# Patient Record
Sex: Female | Born: 1945 | Race: White | Hispanic: No | Marital: Married | Smoking: Never smoker
Health system: Southern US, Community
[De-identification: ages and names within clinical notes are randomized; demographics above are authoritative.]

## PROBLEM LIST (undated history)

## (undated) DIAGNOSIS — F32A Depression, unspecified: Secondary | ICD-10-CM

## (undated) DIAGNOSIS — I872 Venous insufficiency (chronic) (peripheral): Secondary | ICD-10-CM

## (undated) DIAGNOSIS — H269 Unspecified cataract: Secondary | ICD-10-CM

## (undated) DIAGNOSIS — I491 Atrial premature depolarization: Secondary | ICD-10-CM

## (undated) DIAGNOSIS — G709 Myoneural disorder, unspecified: Secondary | ICD-10-CM

## (undated) DIAGNOSIS — C4490 Unspecified malignant neoplasm of skin, unspecified: Secondary | ICD-10-CM

## (undated) DIAGNOSIS — K5730 Diverticulosis of large intestine without perforation or abscess without bleeding: Secondary | ICD-10-CM

## (undated) DIAGNOSIS — G4730 Sleep apnea, unspecified: Secondary | ICD-10-CM

## (undated) DIAGNOSIS — K5900 Constipation, unspecified: Secondary | ICD-10-CM

## (undated) DIAGNOSIS — G43909 Migraine, unspecified, not intractable, without status migrainosus: Secondary | ICD-10-CM

## (undated) DIAGNOSIS — F329 Major depressive disorder, single episode, unspecified: Secondary | ICD-10-CM

## (undated) DIAGNOSIS — K589 Irritable bowel syndrome without diarrhea: Secondary | ICD-10-CM

## (undated) DIAGNOSIS — M810 Age-related osteoporosis without current pathological fracture: Secondary | ICD-10-CM

## (undated) DIAGNOSIS — R002 Palpitations: Secondary | ICD-10-CM

## (undated) DIAGNOSIS — M419 Scoliosis, unspecified: Secondary | ICD-10-CM

## (undated) DIAGNOSIS — T7840XA Allergy, unspecified, initial encounter: Secondary | ICD-10-CM

## (undated) DIAGNOSIS — I341 Nonrheumatic mitral (valve) prolapse: Secondary | ICD-10-CM

## (undated) DIAGNOSIS — M8580 Other specified disorders of bone density and structure, unspecified site: Secondary | ICD-10-CM

## (undated) DIAGNOSIS — T6391XA Toxic effect of contact with unspecified venomous animal, accidental (unintentional), initial encounter: Secondary | ICD-10-CM

## (undated) DIAGNOSIS — M25519 Pain in unspecified shoulder: Secondary | ICD-10-CM

## (undated) DIAGNOSIS — Z9289 Personal history of other medical treatment: Secondary | ICD-10-CM

## (undated) DIAGNOSIS — E119 Type 2 diabetes mellitus without complications: Secondary | ICD-10-CM

## (undated) HISTORY — DX: Nonrheumatic mitral (valve) prolapse: I34.1

## (undated) HISTORY — DX: Atrial premature depolarization: I49.1

## (undated) HISTORY — DX: Unspecified cataract: H26.9

## (undated) HISTORY — DX: Unspecified malignant neoplasm of skin, unspecified: C44.90

## (undated) HISTORY — DX: Age-related osteoporosis without current pathological fracture: M81.0

## (undated) HISTORY — DX: Irritable bowel syndrome, unspecified: K58.9

## (undated) HISTORY — DX: Scoliosis, unspecified: M41.9

## (undated) HISTORY — DX: Pain in unspecified shoulder: M25.519

## (undated) HISTORY — DX: Type 2 diabetes mellitus without complications: E11.9

## (undated) HISTORY — DX: Depression, unspecified: F32.A

## (undated) HISTORY — DX: Allergy, unspecified, initial encounter: T78.40XA

## (undated) HISTORY — DX: Sleep apnea, unspecified: G47.30

## (undated) HISTORY — DX: Palpitations: R00.2

## (undated) HISTORY — DX: Myoneural disorder, unspecified: G70.9

## (undated) HISTORY — DX: Diverticulosis of large intestine without perforation or abscess without bleeding: K57.30

## (undated) HISTORY — DX: Major depressive disorder, single episode, unspecified: F32.9

## (undated) HISTORY — DX: Migraine, unspecified, not intractable, without status migrainosus: G43.909

## (undated) HISTORY — DX: Venous insufficiency (chronic) (peripheral): I87.2

## (undated) HISTORY — DX: Personal history of other medical treatment: Z92.89

## (undated) HISTORY — DX: Other specified disorders of bone density and structure, unspecified site: M85.80

## (undated) HISTORY — PX: WISDOM TOOTH EXTRACTION: SHX21

## (undated) HISTORY — DX: Toxic effect of contact with unspecified venomous animal, accidental (unintentional), initial encounter: T63.91XA

## (undated) HISTORY — DX: Constipation, unspecified: K59.00

---

## 1992-06-15 DIAGNOSIS — Z9289 Personal history of other medical treatment: Secondary | ICD-10-CM

## 1992-06-15 HISTORY — PX: BACK SURGERY: SHX140

## 1992-06-15 HISTORY — DX: Personal history of other medical treatment: Z92.89

## 1997-12-18 ENCOUNTER — Other Ambulatory Visit: Admission: RE | Admit: 1997-12-18 | Discharge: 1997-12-18 | Payer: Self-pay | Admitting: Obstetrics and Gynecology

## 1998-06-15 DIAGNOSIS — H40059 Ocular hypertension, unspecified eye: Secondary | ICD-10-CM | POA: Insufficient documentation

## 1999-02-19 ENCOUNTER — Other Ambulatory Visit: Admission: RE | Admit: 1999-02-19 | Discharge: 1999-02-19 | Payer: Self-pay | Admitting: Obstetrics and Gynecology

## 2000-02-19 ENCOUNTER — Other Ambulatory Visit: Admission: RE | Admit: 2000-02-19 | Discharge: 2000-02-19 | Payer: Self-pay | Admitting: Obstetrics and Gynecology

## 2001-03-01 ENCOUNTER — Other Ambulatory Visit: Admission: RE | Admit: 2001-03-01 | Discharge: 2001-03-01 | Payer: Self-pay | Admitting: Obstetrics and Gynecology

## 2002-03-15 ENCOUNTER — Other Ambulatory Visit: Admission: RE | Admit: 2002-03-15 | Discharge: 2002-03-15 | Payer: Self-pay | Admitting: Obstetrics and Gynecology

## 2003-04-24 ENCOUNTER — Other Ambulatory Visit: Admission: RE | Admit: 2003-04-24 | Discharge: 2003-04-24 | Payer: Self-pay | Admitting: Obstetrics and Gynecology

## 2004-04-22 ENCOUNTER — Ambulatory Visit: Payer: Self-pay | Admitting: Pulmonary Disease

## 2004-06-03 ENCOUNTER — Other Ambulatory Visit: Admission: RE | Admit: 2004-06-03 | Discharge: 2004-06-03 | Payer: Self-pay | Admitting: Obstetrics and Gynecology

## 2004-06-15 HISTORY — PX: ROTATOR CUFF REPAIR: SHX139

## 2004-08-08 ENCOUNTER — Ambulatory Visit: Payer: Self-pay | Admitting: Pulmonary Disease

## 2004-10-17 ENCOUNTER — Ambulatory Visit: Payer: Self-pay | Admitting: Orthopaedic Surgery

## 2004-11-07 ENCOUNTER — Other Ambulatory Visit: Payer: Self-pay

## 2004-11-13 ENCOUNTER — Ambulatory Visit: Payer: Self-pay | Admitting: Orthopaedic Surgery

## 2005-07-09 ENCOUNTER — Other Ambulatory Visit: Admission: RE | Admit: 2005-07-09 | Discharge: 2005-07-09 | Payer: Self-pay | Admitting: Obstetrics & Gynecology

## 2005-07-10 ENCOUNTER — Ambulatory Visit: Payer: Self-pay | Admitting: Pulmonary Disease

## 2005-07-27 ENCOUNTER — Ambulatory Visit: Payer: Self-pay | Admitting: Cardiology

## 2005-08-07 ENCOUNTER — Ambulatory Visit: Payer: Self-pay | Admitting: Pulmonary Disease

## 2005-08-27 ENCOUNTER — Ambulatory Visit: Payer: Self-pay

## 2005-08-27 ENCOUNTER — Encounter: Payer: Self-pay | Admitting: Cardiology

## 2006-04-07 ENCOUNTER — Ambulatory Visit: Payer: Self-pay | Admitting: Pulmonary Disease

## 2006-07-15 ENCOUNTER — Other Ambulatory Visit: Admission: RE | Admit: 2006-07-15 | Discharge: 2006-07-15 | Payer: Self-pay | Admitting: Obstetrics & Gynecology

## 2006-08-20 ENCOUNTER — Ambulatory Visit: Payer: Self-pay | Admitting: Pulmonary Disease

## 2006-08-24 ENCOUNTER — Ambulatory Visit: Payer: Self-pay | Admitting: Pulmonary Disease

## 2006-08-24 LAB — CONVERTED CEMR LAB
ALT: 15 units/L (ref 0–40)
Albumin: 3.6 g/dL (ref 3.5–5.2)
BUN: 17 mg/dL (ref 6–23)
Basophils Absolute: 0 10*3/uL (ref 0.0–0.1)
CO2: 30 meq/L (ref 19–32)
Calcium: 9.3 mg/dL (ref 8.4–10.5)
Chloride: 104 meq/L (ref 96–112)
Cholesterol: 189 mg/dL (ref 0–200)
Eosinophils Relative: 1.4 % (ref 0.0–5.0)
GFR calc non Af Amer: 60 mL/min
Glucose, Bld: 105 mg/dL — ABNORMAL HIGH (ref 70–99)
HDL: 59.3 mg/dL (ref >39.0)
Hemoglobin: 12.9 g/dL (ref 12.0–15.0)
MCV: 96.2 fL (ref 78.0–100.0)
Neutro Abs: 5.4 10*3/uL (ref 1.4–7.7)
Neutrophils Relative %: 79.5 % — ABNORMAL HIGH (ref 43.0–77.0)
Platelets: 210 10*3/uL (ref 150–400)
RBC: 3.91 M/uL (ref 3.87–5.11)
RDW: 12.3 % (ref 11.5–14.6)
VLDL: 16 mg/dL (ref 0–40)
WBC: 6.8 10*3/uL (ref 4.5–10.5)

## 2006-09-10 ENCOUNTER — Ambulatory Visit: Payer: Self-pay | Admitting: Pulmonary Disease

## 2006-09-10 LAB — CONVERTED CEMR LAB
Bilirubin Urine: NEGATIVE
Crystals: NEGATIVE
Ketones, ur: NEGATIVE mg/dL
RBC / HPF: NONE SEEN
Specific Gravity, Urine: 1.01 (ref 1.000–1.03)
WBC, UA: NONE SEEN cells/hpf
pH: 7 (ref 5.0–8.0)

## 2007-06-16 HISTORY — PX: COLONOSCOPY: SHX174

## 2007-08-11 ENCOUNTER — Other Ambulatory Visit: Admission: RE | Admit: 2007-08-11 | Discharge: 2007-08-11 | Payer: Self-pay | Admitting: Obstetrics and Gynecology

## 2007-09-16 ENCOUNTER — Encounter: Payer: Self-pay | Admitting: Pulmonary Disease

## 2007-10-10 ENCOUNTER — Ambulatory Visit: Payer: Self-pay | Admitting: Internal Medicine

## 2007-10-10 ENCOUNTER — Ambulatory Visit: Payer: Self-pay | Admitting: Pulmonary Disease

## 2007-10-10 DIAGNOSIS — T6391XA Toxic effect of contact with unspecified venomous animal, accidental (unintentional), initial encounter: Secondary | ICD-10-CM | POA: Insufficient documentation

## 2007-10-10 DIAGNOSIS — R002 Palpitations: Secondary | ICD-10-CM

## 2007-10-10 DIAGNOSIS — I872 Venous insufficiency (chronic) (peripheral): Secondary | ICD-10-CM

## 2007-10-10 DIAGNOSIS — I059 Rheumatic mitral valve disease, unspecified: Secondary | ICD-10-CM | POA: Insufficient documentation

## 2007-10-10 DIAGNOSIS — K5730 Diverticulosis of large intestine without perforation or abscess without bleeding: Secondary | ICD-10-CM | POA: Insufficient documentation

## 2007-10-10 DIAGNOSIS — K589 Irritable bowel syndrome without diarrhea: Secondary | ICD-10-CM | POA: Insufficient documentation

## 2007-10-10 DIAGNOSIS — H409 Unspecified glaucoma: Secondary | ICD-10-CM | POA: Insufficient documentation

## 2007-10-10 DIAGNOSIS — M4120 Other idiopathic scoliosis, site unspecified: Secondary | ICD-10-CM | POA: Insufficient documentation

## 2007-10-10 DIAGNOSIS — M899 Disorder of bone, unspecified: Secondary | ICD-10-CM | POA: Insufficient documentation

## 2007-10-10 DIAGNOSIS — G43909 Migraine, unspecified, not intractable, without status migrainosus: Secondary | ICD-10-CM | POA: Insufficient documentation

## 2007-10-10 DIAGNOSIS — M25519 Pain in unspecified shoulder: Secondary | ICD-10-CM

## 2007-10-10 DIAGNOSIS — M949 Disorder of cartilage, unspecified: Secondary | ICD-10-CM

## 2007-10-12 ENCOUNTER — Ambulatory Visit: Payer: Self-pay | Admitting: Pulmonary Disease

## 2007-10-16 LAB — CONVERTED CEMR LAB
ALT: 15 units/L (ref 0–35)
AST: 20 units/L (ref 0–37)
BUN: 16 mg/dL (ref 6–23)
Basophils Absolute: 0 10*3/uL (ref 0.0–0.1)
CO2: 31 meq/L (ref 19–32)
Eosinophils Absolute: 0.1 10*3/uL (ref 0.0–0.7)
Eosinophils Relative: 2.1 % (ref 0.0–5.0)
GFR calc Af Amer: 72 mL/min
Hemoglobin: 14.2 g/dL (ref 12.0–15.0)
LDL Cholesterol: 114 mg/dL — ABNORMAL HIGH (ref 0–99)
MCHC: 33.2 g/dL (ref 30.0–36.0)
MCV: 95.7 fL (ref 78.0–100.0)
Monocytes Relative: 7.7 % (ref 3.0–12.0)
RBC: 4.46 M/uL (ref 3.87–5.11)
TSH: 1.32 microintl units/mL (ref 0.35–5.50)
Total CHOL/HDL Ratio: 3.7
Triglycerides: 56 mg/dL (ref 0–149)
VLDL: 11 mg/dL (ref 0–40)

## 2007-10-21 ENCOUNTER — Ambulatory Visit: Payer: Self-pay | Admitting: Internal Medicine

## 2007-10-21 ENCOUNTER — Encounter: Payer: Self-pay | Admitting: Pulmonary Disease

## 2008-03-07 ENCOUNTER — Ambulatory Visit: Payer: Self-pay | Admitting: Pulmonary Disease

## 2008-07-23 ENCOUNTER — Ambulatory Visit: Payer: Self-pay | Admitting: Pulmonary Disease

## 2008-07-25 ENCOUNTER — Ambulatory Visit: Payer: Self-pay | Admitting: Vascular Surgery

## 2008-07-25 ENCOUNTER — Encounter: Payer: Self-pay | Admitting: Pulmonary Disease

## 2008-08-14 ENCOUNTER — Other Ambulatory Visit: Admission: RE | Admit: 2008-08-14 | Discharge: 2008-08-14 | Payer: Self-pay | Admitting: Obstetrics & Gynecology

## 2008-08-24 ENCOUNTER — Ambulatory Visit: Payer: Self-pay | Admitting: Pulmonary Disease

## 2008-10-11 ENCOUNTER — Ambulatory Visit: Payer: Self-pay | Admitting: Pulmonary Disease

## 2008-10-13 LAB — CONVERTED CEMR LAB
ALT: 17 units/L (ref 0–35)
AST: 20 units/L (ref 0–37)
Albumin: 4.3 g/dL (ref 3.5–5.2)
Alkaline Phosphatase: 54 units/L (ref 39–117)
Bilirubin Urine: NEGATIVE
Bilirubin, Direct: 0.1 mg/dL (ref 0.0–0.3)
Calcium: 9.7 mg/dL (ref 8.4–10.5)
Chloride: 106 meq/L (ref 96–112)
Direct LDL: 118.8 mg/dL
Eosinophils Absolute: 0.1 10*3/uL (ref 0.0–0.7)
GFR calc non Af Amer: 59.7 mL/min (ref >60)
Glucose, Bld: 105 mg/dL — ABNORMAL HIGH (ref 70–99)
HCT: 45.2 % (ref 36.0–46.0)
Hemoglobin, Urine: NEGATIVE
Lymphocytes Relative: 15 % (ref 12.0–46.0)
MCHC: 34.7 g/dL (ref 30.0–36.0)
MCV: 96.3 fL (ref 78.0–100.0)
Monocytes Absolute: 0.3 10*3/uL (ref 0.1–1.0)
Monocytes Relative: 4.2 % (ref 3.0–12.0)
Potassium: 4.8 meq/L (ref 3.5–5.1)
Sodium: 141 meq/L (ref 135–145)
Specific Gravity, Urine: <=1.005 (ref 1.000–1.030)
Total CHOL/HDL Ratio: 3
Urobilinogen, UA: 0.2 (ref 0.0–1.0)
WBC: 6.5 10*3/uL (ref 4.5–10.5)
pH: 5.5 (ref 5.0–8.0)

## 2008-10-24 ENCOUNTER — Ambulatory Visit: Payer: Self-pay

## 2008-10-24 ENCOUNTER — Encounter: Payer: Self-pay | Admitting: Pulmonary Disease

## 2009-03-29 ENCOUNTER — Ambulatory Visit: Payer: Self-pay | Admitting: Pulmonary Disease

## 2009-09-17 ENCOUNTER — Encounter: Payer: Self-pay | Admitting: Pulmonary Disease

## 2009-09-18 ENCOUNTER — Telehealth (INDEPENDENT_AMBULATORY_CARE_PROVIDER_SITE_OTHER): Payer: Self-pay | Admitting: *Deleted

## 2009-10-24 ENCOUNTER — Ambulatory Visit: Payer: Self-pay | Admitting: Pulmonary Disease

## 2009-10-26 LAB — CONVERTED CEMR LAB
AST: 20 units/L (ref 0–37)
Alkaline Phosphatase: 50 units/L (ref 39–117)
Bilirubin, Direct: 0.1 mg/dL (ref 0.0–0.3)
Cholesterol: 192 mg/dL (ref 0–200)
Eosinophils Relative: 2.6 % (ref 0.0–5.0)
Glucose, Bld: 92 mg/dL (ref 70–99)
Hemoglobin: 15.3 g/dL — ABNORMAL HIGH (ref 12.0–15.0)
Lymphocytes Relative: 19.7 % (ref 12.0–46.0)
MCHC: 34.3 g/dL (ref 30.0–36.0)
MCV: 95.6 fL (ref 78.0–100.0)
Monocytes Absolute: 0.3 10*3/uL (ref 0.1–1.0)
Monocytes Relative: 5.3 % (ref 3.0–12.0)
Neutro Abs: 4.1 10*3/uL (ref 1.4–7.7)
Neutrophils Relative %: 71.8 % (ref 43.0–77.0)
Platelets: 184 10*3/uL (ref 150.0–400.0)
RBC: 4.66 M/uL (ref 3.87–5.11)
RDW: 13.4 % (ref 11.5–14.6)
VLDL: 14.4 mg/dL (ref 0.0–40.0)

## 2010-01-28 ENCOUNTER — Ambulatory Visit: Payer: Self-pay | Admitting: Adult Health

## 2010-03-14 ENCOUNTER — Telehealth: Payer: Self-pay | Admitting: Adult Health

## 2010-03-14 ENCOUNTER — Ambulatory Visit: Payer: Self-pay | Admitting: Pulmonary Disease

## 2010-04-15 HISTORY — PX: CATARACT EXTRACTION: SUR2

## 2010-07-15 NOTE — Assessment & Plan Note (Signed)
Summary: cpx/fasting/apc   CC:  Yearly CPX....  History of Present Illness: 65 y/o WF here for a follow up visit...  she has multiple medical problems as noted below...     ~  [name]:  she retired from [employer] for [employer] and has been doing some consulting work for them... she has been doing well overall but has had several concerns-  noted temp difference in her legs w/ cool RLE- pulses OK, neg eval from [facility]- DrFields, w/ norm art dopplers and ABI's... this was felt to be a result of her prev back surg affecting her sympathetic nerves to her right leg...  now c/o some swelling in her left leg- no pain, erythema, etc- no long trips, prev DVT, etc... she states left leg always sl larger than right, "but this is more so"... we discussed VenDopplers (neg for DVT), no salt, elevation, TED hose.   ~  Oct 24, 2009:     Current Problems:   GLAUCOMA (ICD-365.9) - on 3 eye drops per Optometrist, [name]...  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  MITRAL VALVE PROLAPSE (ICD-424.0) - on ASA 81mg /d... clinical Dx w/ hx of CP & palpit in the past, but none recently... intermittent click/ murmur heard... 2DEcho 3/07 was neg without MVP seen, plus norm wall motion and EF=55-60%...  PALPITATIONS (ICD-785.1) - none recently- on ATENOLOL 25mg /d... avoids caffeine, etc...  VENOUS INSUFFICIENCY (ICD-459.81) - she states left leg always sl > right leg... eval 4/10 w/ VenDopplers = neg for DVT... TED hose helps...  ~  2/10 had right leg pain & coolness- eval by [facility] w/ normal ABI's & symptoms felt to be secondary to prev back surgery and sympathetic nerves to right leg...  DIVERTICULOSIS OF COLON (ICD-562.10) - colonoscopy 1/01 by [name] showed divertics only...  ~  f/u colonoscopy 5/09 by [name] showed divertics, no polyps... f/u rec in 56yrs.  Hx of IRRITABLE BOWEL SYNDROME (ICD-564.1) - no recent symptoms, doing well x mild  constipation- rec Metamucil etc...  Hx of SHOULDER PAIN (ICD-719.41) - s/p rotator cuff surg at [facility]...  Hx of SCOLIOSIS (ICD-737.30) - s/p surgery at the [facility] in 1994 w rods etc to correct the scoliosis... this surg apparently affected the sympathetic nerves to her right leg w/ coolness & pain intermittently...  OSTEOPENIA (ICD-733.90) - BMD followed at [facility] on FOSAMAX 70mg /wk, calcium, vits, vit D OTC per GYN... she stopped Fosamax on her own 2011 after  ~7yrs Rx for a drug holiday...  ~  BMD 09/16/07 showed Osteopenia w/ TScores --1.1 to -1.9.Marland KitchenMarland Kitchen  ~  BMD 09/17/09 showed TScores -2.1 left FemNeck, & -1.1 left total forearm...  Hx of MIGRAINE HEADACHE (ICD-346.90) - extensive eval and f/u by DrFreeman... on ATENOLOL 25mg /d for prevention, on ZONEGRAN 250mg /d for prevention, uses FROVA 2.5mg  for Rx...  GYN = DrSuzMiller who does PAP, Mammogram, and checked her Vit D level - Rx w/ Vit D 50000 u weekly for awhile, now on OTC Rx.   Allergies: 1)  ! * Wasp Venom  Comments:  Nurse/Medical Assistant: The patient's medications and allergies were reviewed with the patient and were updated in the Medication and Allergy Lists.  Past History:  Past Medical History: GLAUCOMA (ICD-365.9) Hx of TOXIC EFFECT OF VENOM (ICD-989.5) MITRAL VALVE PROLAPSE (ICD-424.0) PALPITATIONS (ICD-785.1) VENOUS INSUFFICIENCY (ICD-459.81) DIVERTICULOSIS OF COLON (ICD-562.10) Hx of IRRITABLE BOWEL SYNDROME (ICD-564.1) Hx of SHOULDER PAIN (ICD-719.41) Hx of  SCOLIOSIS (ICD-737.30) OSTEOPENIA (ICD-733.90) Hx of MIGRAINE HEADACHE (ICD-346.90)  Past Surgical History: S/P scoliosis surgery w/ rods in 1994 @ [facility] S/P rotator cuff surgery  Impression & Recommendations:  Problem # 1:  PHYSICAL EXAMINATION (ICD-V70.0) Good general health... she had major back/ scoliosis surg & doing well... Orders: EKG w/ Interpretation (93000) T-2 View CXR (71020TC) TLB-BMP (Basic Metabolic  Panel-BMET) (80048-METABOL) TLB-Hepatic/Liver Function Pnl (80076-HEPATIC) TLB-CBC Platelet - w/Differential (85025-CBCD) TLB-Lipid Panel (80061-LIPID) TLB-TSH (Thyroid Stimulating Hormone) (84443-TSH)  Problem # 2:  MITRAL VALVE PROLAPSE (ICD-424.0) Clinical dx>  no symptoms, doing satis... Her updated medication list for this problem includes:    Adult Aspirin Low Strength 81 Mg Tbdp (Aspirin) .[name]... Take 1 tablet by mouth once a day    Atenolol 25 Mg Tabs (Atenolol) .[name]... Take 1 tablet by mouth once a day  Problem # 3:  VENOUS INSUFFICIENCY (ICD-459.81) Stable>  no salt, elevation, TEDs...  Problem # 4:  DIVERTICULOSIS OF COLON (ICD-562.10) GI is stable>  colon 2009 w/ f/u due 45yrs.  Problem # 5:  Hx of SCOLIOSIS (ICD-737.30) S/p scoliosis surg w/ rods etc in 1994>  doing well.  Problem # 6:  OSTEOPENIA (ICD-733.90) She is off Fosamax for Drug Holiday... The following medications were removed from the medication list:    Alendronate Sodium 70 Mg Tabs (Alendronate sodium) .[name]... Take one tablet by mouth every week  Problem # 7:  OTHER MEDICAL PROBLEMS AS NOTED>>>  Complete Medication List: 1)  Travatan Z 0.004 % Soln (Travoprost) .... Use once daily 2)  Azopt 1 % Susp (Brinzolamide) .... Use one drop in each eye two times a day 3)  Restasis 0.05 % Emul (Cyclosporine) .... Use one drop in each eye two times a day 4)  Adult Aspirin Low Strength 81 Mg Tbdp (Aspirin) .... Take 1 tablet by mouth once a day 5)  Fish Oil 1000 Mg Caps (Omega-3 fatty acids) .... Take 1  tablets by mouth two times a day 6)  Atenolol 25 Mg Tabs (Atenolol) .... Take 1 tablet by mouth once a day 7)  Zonegran 100 Mg Caps (Zonisamide) .[name].. 1 1/2  capsules by mouth two times a day 8)  Frova 2.5 Mg Tabs (Frovatriptan succinate) .... As needed 9)  Calcium Carbonate-vitamin D 600-400 Mg-unit Tabs (Calcium carbonate-vitamin d) .... Take 1 tablet by mouth once a day 10)  Viactiv 500-100-40 Chew (Calcium-vitamin  d-vitamin k) .... Once daily 11)  Multivitamins Tabs (Multiple vitamin) .... Take 1 tablet by mouth once a day 12)  Vitamin C 500 Mg Tabs (Ascorbic acid) .... Take 1 tablet by mouth once a day 13)  Vitamin D 1000 Unit Tabs (Cholecalciferol) .... Take 1 tablet by mouth once a day   Family History: Reviewed history and no changes required. mother alive age 38  hx of DM father alive age 61 1 sibling alive age 4 1 sibling alive age 1  Social History: Reviewed history from 07/23/2008 and no changes required. never smoker married 2 children starting to exercise now  4 times weekly no caffiene use no alcohol use  Review of Systems       The patient complains of back pain and stiffness.  The patient denies fever, chills, sweats, anorexia, fatigue, weakness, malaise, weight loss, sleep disorder, blurring, diplopia, eye irritation, eye discharge, vision loss, eye pain, photophobia, earache, ear discharge, tinnitus, decreased hearing, nasal congestion, nosebleeds, sore throat, hoarseness, chest pain, palpitations, syncope, dyspnea on exertion, orthopnea, PND, peripheral edema, cough, dyspnea at  rest, excessive sputum, hemoptysis, wheezing, pleurisy, nausea, vomiting, diarrhea, constipation, change in bowel habits, abdominal pain, melena, hematochezia, jaundice, gas/bloating, indigestion/heartburn, dysphagia, odynophagia, dysuria, hematuria, urinary frequency, urinary hesitancy, nocturia, incontinence, joint pain, joint swelling, muscle cramps, muscle weakness, arthritis, sciatica, restless legs, leg pain at night, leg pain with exertion, rash, itching, dryness, suspicious lesions, paralysis, paresthesias, seizures, tremors, vertigo, transient blindness, frequent falls, frequent headaches, difficulty walking, depression, anxiety, memory loss, confusion, cold intolerance, heat intolerance, polydipsia, polyphagia, polyuria, unusual weight change, abnormal bruising, bleeding, enlarged lymph nodes,  urticaria, allergic rash, hay fever, and recurrent infections.    Vital Signs:  Patient profile:   65 year old female Height:      64.5 inches Weight:      130 pounds BMI:     22.05 O2 Sat:      99 % on Room air Temp:     99.1 degrees F oral Pulse rate:   65 / minute BP sitting:   102 / 72  (left arm) Cuff size:   regular  Vitals Entered By: Randell Loop CMA (Oct 24, 2009 9:54 AM)  O2 Sat at Rest %:  99 O2 Flow:  Room air CC: Yearly CPX... Is Patient Diabetic? No Pain Assessment Patient in pain? no      Comments meds updated today   Physical Exam  Additional Exam:  WD, WN, 65 y/o WF in NAD... GENERAL:  Alert & oriented; pleasant & cooperative... HEENT:  [place]/AT, EOM-full, PERRLA, Fundi- not vis, EACs-clear, TMs-wnl, NOSE-clear, THROAT-clear & wnl. NECK:  Supple w/ fairROM; no JVD; normal carotid impulses w/o bruits; no thyromegaly or nodules palpated; no lymphadenopathy. CHEST:  Clear to P & A; without wheezes/ rales/ or rhonchi heard... HEART:  Regular Rhythm; without murmurs/ rubs/ or gallops detected... ABDOMEN:  Soft & nontender; normal bowel sounds; no organomegaly or masses palpated... BACK:  s/p scoliosis surg w/ rods etc... EXT: without deformities, mild arthritic changes; no varicose veins/ +venous insuffic on left>right, neg homan's etc. NEURO:  CN's intact; motor testing normal; sensory testing normal; gait normal & balance OK. DERM:  no lesions noted...    CXR  Procedure date:  10/24/2009  Findings:      CHEST - 2 VIEW Comparison: 10/11/2008   Findings: Heart size is normal.  Both lungs are clear.  No evidence of pleural effusion.  No mass or adenopathy identified.  Prior left thoracotomy.  Thoracolumbar scoliosis again noted, with posterior spinal fixation rods in the lower thoracic and lumbar spine.   IMPRESSION: Stable exam.  No active disease.   Read By:  Danae Orleans,  M.D.   EKG  Procedure date:  10/24/2009  Findings:      Sinus  bradycardia with rate of:  50/ min... Tracing is WNL, NAD...  SN   MISC. Report  Procedure date:  10/24/2009  Findings:      BMP (METABOL)   Sodium                    142 mEq/L                   135-145   Potassium                 4.7 mEq/L                   3.5-5.1   Chloride                  105 mEq/L  96-112   Carbon Dioxide            29 mEq/L                    19-32   Glucose                   92 mg/dL                    98-11   BUN                       19 mg/dL                    9-14   Creatinine                0.9 mg/dL                   7.8-2.9   Calcium                   9.4 mg/dL                   5.6-21.3   GFR                       70.81 mL/min                >60  Hepatic/Liver Function Panel (HEPATIC)   Total Bilirubin           0.7 mg/dL                   0.8-6.5   Direct Bilirubin          0.1 mg/dL                   7.8-4.6   Alkaline Phosphatase      50 U/L                      39-117   AST                       20 U/L                      0-37   ALT                       16 U/L                      0-35   Total Protein             7.4 g/dL                    9.6-2.9   Albumin                   4.2 g/dL                    5.2-8.4  CBC Platelet w/Diff (CBCD)   White Cell Count          5.7 K/uL                    4.5-10.5   Red Cell Count            4.66 Mil/uL  3.87-5.11   Hemoglobin           [H]  15.3 g/dL                   40.9-81.1   Hematocrit                44.5 %                      36.0-46.0   MCV                       95.6 fl                     78.0-100.0   Platelet Count            184.0 K/uL                  150.0-400.0   Neutrophil %              71.8 %                      43.0-77.0   Lymphocyte %              19.7 %                      12.0-46.0   Monocyte %                5.3 %                       3.0-12.0   Eosinophils%              2.6 %                       0.0-5.0   Basophils %               0.6 %                        0.0-3.0  Comments:      Lipid Panel (LIPID)   Cholesterol               192 mg/dL                   9-147   Triglycerides             72.0 mg/dL                  8.2-956.2   HDL                       13.08 mg/dL                 >65.78   LDL Cholesterol      [H]  469 mg/dL                   6-29     TSH (TSH)   FastTSH                   1.54 uIU/mL                 0.35-5.50   Impression & Recommendations:  Problem # 1:  PHYSICAL EXAMINATION (ICD-V70.0) Good general health... she had major back/ scoliosis surg &  doing well... Orders: EKG w/ Interpretation (93000) T-2 View CXR (71020TC) TLB-BMP (Basic Metabolic Panel-BMET) (80048-METABOL) TLB-Hepatic/Liver Function Pnl (80076-HEPATIC) TLB-CBC Platelet - w/Differential (85025-CBCD) TLB-Lipid Panel (80061-LIPID) TLB-TSH (Thyroid Stimulating Hormone) (84443-TSH)  Problem # 2:  MITRAL VALVE PROLAPSE (ICD-424.0) Clinical dx>  no symptoms, doing satis... Her updated medication list for this problem includes:    Adult Aspirin Low Strength 81 Mg Tbdp (Aspirin) .[name]... Take 1 tablet by mouth once a day    Atenolol 25 Mg Tabs (Atenolol) .[name]... Take 1 tablet by mouth once a day  Problem # 3:  VENOUS INSUFFICIENCY (ICD-459.81) Stable>  no salt, elevation, TEDs...  Problem # 4:  DIVERTICULOSIS OF COLON (ICD-562.10) GI is stable>  colon 2009 w/ f/u due 50yrs.  Problem # 5:  Hx of SCOLIOSIS (ICD-737.30) S/p scoliosis surg w/ rods etc in 1994>  doing well.  Problem # 6:  OSTEOPENIA (ICD-733.90) She is off Fosamax for Drug Holiday... The following medications were removed from the medication list:    Alendronate Sodium 70 Mg Tabs (Alendronate sodium) .[name]... Take one tablet by mouth every week  Problem # 7:  OTHER MEDICAL PROBLEMS AS NOTED>>>  Complete Medication List: 1)  Travatan Z 0.004 % Soln (Travoprost) .... Use once daily 2)  Azopt 1 % Susp (Brinzolamide) .... Use one drop in each eye two times a day 3)  Restasis  0.05 % Emul (Cyclosporine) .... Use one drop in each eye two times a day 4)  Adult Aspirin Low Strength 81 Mg Tbdp (Aspirin) .... Take 1 tablet by mouth once a day 5)  Fish Oil 1000 Mg Caps (Omega-3 fatty acids) .... Take 1  tablets by mouth two times a day 6)  Atenolol 25 Mg Tabs (Atenolol) .... Take 1 tablet by mouth once a day 7)  Zonegran 100 Mg Caps (Zonisamide) .[name].. 1 1/2  capsules by mouth two times a day 8)  Frova 2.5 Mg Tabs (Frovatriptan succinate) .... As needed 9)  Calcium Carbonate-vitamin D 600-400 Mg-unit Tabs (Calcium carbonate-vitamin d) .... Take 1 tablet by mouth once a day 10)  Viactiv 500-100-40 Chew (Calcium-vitamin d-vitamin k) .... Once daily 11)  Multivitamins Tabs (Multiple vitamin) .... Take 1 tablet by mouth once a day 12)  Vitamin C 500 Mg Tabs (Ascorbic acid) .... Take 1 tablet by mouth once a day 13)  Vitamin D 1000 Unit Tabs (Cholecalciferol) .... Take 1 tablet by mouth once a day  Patient Instructions: 1)  Today we updated your med list- see below.... 2)  We refilled your Atenolol per request... 3)  We are planning a 15yr Drug holiday off the Bisphosphonate meds (Alendronate) & will rely on the Calcium, MVI, VitD, & weight bearingh exercise to help your bones until the next bone density test 4/13... 4)  Call for any questions.Marland KitchenMarland Kitchen 5)  Please schedule a follow-up appointment in 1 year, sooner as needed. Prescriptions: ATENOLOL 25 MG  TABS (ATENOLOL) Take 1 tablet by mouth once a day  #100 x prn   Entered and Authorized by:   Michele Mcalpine MD   Signed by:   Michele Mcalpine MD on 10/24/2009   Method used:   Print then Give to Patient   RxID:   [id number]       [facility] ECG  ID: [id number] Patient: Dana Massey DOB: [birth year] Age: 65 Years Old Sex: Female Race: White Physician: scott nadel Technician: Randell Loop CMA Height: 64.5 Weight: 130 Status: Unconfirmed Past Medical History:  GLAUCOMA (ICD-365.9) Hx of  TOXIC EFFECT OF  VENOM (ICD-989.5) MITRAL VALVE PROLAPSE (ICD-424.0) PALPITATIONS (ICD-785.1) VENOUS INSUFFICIENCY (ICD-459.81) DIVERTICULOSIS OF COLON (ICD-562.10) Hx of IRRITABLE BOWEL SYNDROME (ICD-564.1) Hx of SHOULDER PAIN (ICD-719.41) Hx of SCOLIOSIS (ICD-737.30) OSTEOPENIA (ICD-733.90) Hx of MIGRAINE HEADACHE (ICD-346.90)   Recorded: 10/24/2009 10:09 AM P/PR: 127 ms / 157 ms - Heart rate (maximum exercise) QRS: 98 QT/QTc/QTd: 453 ms / 436 ms / 45 ms - Heart rate (maximum exercise)  P/QRS/T axis: 48 deg / 56 deg / 83 deg - Heart rate (maximum exercise)  Heartrate: 50 bpm  Interpretation:  Sinus bradycardia with rate of:  50/ min... Tracing is WNL, NAD...  SN

## 2010-07-15 NOTE — Progress Notes (Signed)
Summary: Records request from [employer] for records received from [employer]. Request forwarded to [facility]. Dena Chavis  September 18, 2009 4:31 PM

## 2010-07-15 NOTE — Assessment & Plan Note (Signed)
Summary: severe ha and vision problems/jd   CC:  increased frequency in HAs - 14 migraines this month .  History of Present Illness: 65 y/o WF with known history of Divertics,IBS,  and MHA.      ~  [name]:  she retired from [employer] for [employer] and has been doing some consulting work for them... she has been doing well overall but has had several concerns-  noted temp difference in her legs w/ cool RLE- pulses OK, neg eval from [facility]- DrFields, w/ norm art dopplers and ABI's... this was felt to be a result of her prev back surg affecting her sympathetic nerves to her right leg...  now c/o some swelling in her left leg- no pain, erythema, etc- no long trips, prev DVT, etc... she states left leg always sl larger than right, "but this is more so"... we discussed VenDopplers (neg for DVT), no salt, elevation, TED hose.   ~  Oct 24, 2009:  CPX>>drug holiday off fosamax  January 28, 2010--Presents for an acute office visit. She complains of abdominal cramps, bloating, gas, mucus like discharge in stools intermittent x 3 months. Last colonoscopy 5/09 by [name] showed divertics, no polyps.  She says symptoms are worse in am. We reviewed her diet , she eats healthy but low in fiber. She has no severe abd pain, fever, urinary symptoms, bloody stools. Denies chest pain, dyspnea, orthopnea, hemoptysis, fever, n/v/d, edema, headache,recent travel, or abx use. Has not taken any meds for treament.    March 14, 2010 -Pt presents for work in visit. Complains of increased frequency in HAs over last few months. On average she has 8 headaches a month -most months. Last month she has had 14 migraines . She is followed by [facility] center. but unable to have appt w/ [facility] for another 5 weeks. She has noticed over last 5 months that her left eye vision has not been as good and at time blurry. She was seen for gliaucoma check in 10/2009 w/ no changes. Symptoms started after this check. She denies loss of  vision, ext weakness, slurred speech. She is concerned that she has never had a MRI of brain. Her neice has MHA and recently had MRI. She has had MHA for >40 yrs w/ extensive workup at HA center. Denies chest pain, dyspnea, orthopnea, hemoptysis, fever, n/v/d, edema.   She does do alot of computer work and admits to not taking enough breaks to rest neck and eyes.   Medications Prior to Update: 1)  Travatan Z 0.004 %  Soln (Travoprost) .... Use Once Daily 2)  Azopt 1 %  Susp (Brinzolamide) .... Use One Drop in Each Eye Two Times A Day 3)  Restasis 0.05 %  Emul (Cyclosporine) .... Use One Drop in Each Eye Two Times A Day 4)  Adult Aspirin Low Strength 81 Mg  Tbdp (Aspirin) .... Take 1 Tablet By Mouth Once A Day 5)  Fish Oil 1000 Mg  Caps (Omega-3 Fatty Acids) .... Take 1  Tablets By Mouth Two Times A Day 6)  Atenolol 25 Mg  Tabs (Atenolol) .... Take 1 Tablet By Mouth Once A Day 7)  Zonegran 100 Mg Caps (Zonisamide) .... 250mg  Once Daily 8)  Frova 2.5 Mg  Tabs (Frovatriptan Succinate) .... As Needed 9)  Calcium Carbonate-Vitamin D 600-400 Mg-Unit  Tabs (Calcium Carbonate-Vitamin D) .... Take 1 Tablet By Mouth Once A Day 10)  Viactiv 500-100-40  Chew (Calcium-Vitamin D-Vitamin K) .... Once Daily 11)  Multivitamins   Tabs (Multiple Vitamin) .... Take 1 Tablet By Mouth Once A Day 12)  Vitamin C 500 Mg  Tabs (Ascorbic Acid) .... Take 1 Tablet By Mouth Once A Day 13)  Vitamin D 1000 Unit Tabs (Cholecalciferol) .... Take 1 Tablet By Mouth Once A Day 14)  Miralax  Powd (Polyethylene Glycol 3350) .[name].. 1 Capful At Bedtime As Needed Costipation 15)  Stool Softener 100 Mg Caps (Docusate Sodium) .[name].. 1 By Mouth At Bedtime As Needed Constipation 16)  Probiotic Formula  Caps (Probiotic Product) .[name].. 1 By Mouth Once Daily  Current Medications (verified): 1)  Travatan Z 0.004 %  Soln (Travoprost) .... Use Once Daily 2)  Azopt 1 %  Susp (Brinzolamide) .... Use One Drop in Each Eye Two Times A Day 3)  Restasis  0.05 %  Emul (Cyclosporine) .... Use One Drop in Each Eye Two Times A Day 4)  Adult Aspirin Low Strength 81 Mg  Tbdp (Aspirin) .... Take 1 Tablet By Mouth Once A Day 5)  Fish Oil 1000 Mg  Caps (Omega-3 Fatty Acids) .... Take 1  Tablets By Mouth Two Times A Day 6)  Atenolol 25 Mg  Tabs (Atenolol) .... Take 1 Tablet By Mouth Once A Day 7)  Zonegran 100 Mg Caps (Zonisamide) .... 250mg  Once Daily 8)  Frova 2.5 Mg  Tabs (Frovatriptan Succinate) .... As Needed 9)  Calcium Carbonate-Vitamin D 600-400 Mg-Unit  Tabs (Calcium Carbonate-Vitamin D) .... Take 1 Tablet By Mouth Once A Day 10)  Viactiv 500-100-40  Chew (Calcium-Vitamin D-Vitamin K) .... Once Daily 11)  Multivitamins   Tabs (Multiple Vitamin) .... Take 1 Tablet By Mouth Once A Day 12)  Vitamin C 500 Mg  Tabs (Ascorbic Acid) .... Take 1 Tablet By Mouth Once A Day 13)  Vitamin D 1000 Unit Tabs (Cholecalciferol) .... Take 1 Tablet By Mouth Once A Day 14)  Miralax  Powd (Polyethylene Glycol 3350) .[name].. 1 Capful At Bedtime As Needed Costipation 15)  Stool Softener 100 Mg Caps (Docusate Sodium) .[name].. 1 By Mouth At Bedtime As Needed Constipation 16)  Probiotic Formula  Caps (Probiotic Product) .[name].. 1 By Mouth Once Daily  Allergies: 1)  ! * Wasp Venom  Past History:  Past Medical History: Last updated: 10/24/2009 GLAUCOMA (ICD-365.9) Hx of TOXIC EFFECT OF VENOM (ICD-989.5) MITRAL VALVE PROLAPSE (ICD-424.0) PALPITATIONS (ICD-785.1) VENOUS INSUFFICIENCY (ICD-459.81) DIVERTICULOSIS OF COLON (ICD-562.10) Hx of IRRITABLE BOWEL SYNDROME (ICD-564.1) Hx of SHOULDER PAIN (ICD-719.41) Hx of SCOLIOSIS (ICD-737.30) OSTEOPENIA (ICD-733.90) Hx of MIGRAINE HEADACHE (ICD-346.90)  Past Surgical History: Last updated: 10/24/2009 S/P scoliosis surgery w/ rods in 1994 @ [facility] S/P rotator cuff surgery  Family History: Last updated: 10/24/2009 mother alive age 70  hx of DM father alive age 76 1 sibling alive age 29 1 sibling alive age  64  Social History: Last updated: 10/24/2009 never smoker married 2 children starting to exercise now  4 times weekly no caffiene use no alcohol use  Risk Factors: Smoking Status: never (01/28/2010)  Vital Signs:  Patient profile:   65 year old female Height:      64.5 inches Weight:      130.38 pounds BMI:     22.11 O2 Sat:      99 % on Room air Temp:     97.0 degrees F oral Pulse rate:   70 / minute BP sitting:   130 / 86  (left arm) Cuff size:   regular  Vitals Entered By: Boone Master CNA/MA (  March 14, 2010 10:23 AM)  O2 Flow:  Room air CC: increased frequency in HAs - 14 migraines this month  Is Patient Diabetic? No Comments Medications reviewed with patient Daytime contact number verified with patient. Boone Master CNA/MA  March 14, 2010 10:23 AM    Physical Exam  Additional Exam:  WD, WN, 65 y/o WF in NAD... GENERAL:  Alert & oriented; pleasant & cooperative... HEENT:  [place]/AT,   EACs-clear, TMs-wnl, NOSE-clear, THROAT-clear & wnl. NECK:  Supple w/ full ROM; no JVD; normal carotid impulses w/o bruits; no thyromegaly or nodules palpated; no lymphadenopathy. CHEST:  Clear to P & A; without wheezes/ rales/ or rhonchi. HEART:  Regular Rhythm; without murmurs/ rubs/ or gallops heard... ABDOMEN:  Soft & nontender; normal bowel sounds; no organomegaly or masses detected.,no guarding or rebound noted.  BACK:  s/p scoliosis surg w/ rods etc... EXT: without deformities, mild arthritic changes; no varicose veins/ +venous insuffic on left/   NEURO:  CN's intact; motor testing normal; sensory testing normal; gait normal & balance OK., nml hand grips, MAEW x4 , PERRLA, EOMI w/o nystagmus, fundi benign.  DERM:  nodules resolved.     Impression & Recommendations:  Problem # 1:  Hx of MIGRAINE HEADACHE (ICD-346.90)  Flare of MHA, ? etiology-- begin HA calendar.  Exam is unrevealing. Vision has been progressively w/ no aucte loss of vision.  Will hold on MRI  for now if no improvment w/ below recs, will consider MRI to evaluate change in ha frequency  long discussion w/ pt regarding follow up if not improving or worsens.  Plan:  Warm heat to neck, relaxation , stress reducers.  Need to follow up eye doctor early next week for evaluation.  Please contact office for sooner follow up if symptoms do not improve or worsen  Call back if not improving we will see you  back in office for further testing.  follow up headache clinic as scheduled and as needed  Her updated medication list for this problem includes:    Adult Aspirin Low Strength 81 Mg Tbdp (Aspirin) .[name]... Take 1 tablet by mouth once a day    Atenolol 25 Mg Tabs (Atenolol) .[name]... Take 1 tablet by mouth once a day    Frova 2.5 Mg Tabs (Frovatriptan succinate) .[name]... As needed  Orders: Est. Patient Level III (16109)  Complete Medication List: 1)  Travatan Z 0.004 % Soln (Travoprost) .... Use once daily 2)  Azopt 1 % Susp (Brinzolamide) .... Use one drop in each eye two times a day 3)  Restasis 0.05 % Emul (Cyclosporine) .... Use one drop in each eye two times a day 4)  Adult Aspirin Low Strength 81 Mg Tbdp (Aspirin) .... Take 1 tablet by mouth once a day 5)  Fish Oil 1000 Mg Caps (Omega-3 fatty acids) .... Take 1  tablets by mouth two times a day 6)  Atenolol 25 Mg Tabs (Atenolol) .... Take 1 tablet by mouth once a day 7)  Zonegran 100 Mg Caps (Zonisamide) .... 250mg  once daily 8)  Frova 2.5 Mg Tabs (Frovatriptan succinate) .... As needed 9)  Calcium Carbonate-vitamin D 600-400 Mg-unit Tabs (Calcium carbonate-vitamin d) .... Take 1 tablet by mouth once a day 10)  Viactiv 500-100-40 Chew (Calcium-vitamin d-vitamin k) .... Once daily 11)  Multivitamins Tabs (Multiple vitamin) .... Take 1 tablet by mouth once a day 12)  Vitamin C 500 Mg Tabs (Ascorbic acid) .... Take 1 tablet by mouth once a day 13)  Vitamin D  1000 Unit Tabs (Cholecalciferol) .... Take 1 tablet by mouth once a day 14)  Miralax  Powd (Polyethylene glycol 3350) .[name].. 1 capful at bedtime as needed costipation 15)  Stool Softener 100 Mg Caps (Docusate sodium) .[name].. 1 by mouth at bedtime as needed constipation 16)  Probiotic Formula Caps (Probiotic product) .[name].. 1 by mouth once daily  Other Orders: Admin 1st Vaccine (16109) Flu Vaccine 78yrs + (60454)  Patient Instructions: 1)  Warm heat to neck, relaxation , stress reducers.  2)  Need to follow up eye doctor early next week for evaluation.  3)  Please contact office for sooner follow up if symptoms do not improve or worsen  4)  Call back if not improving we will see you  back in office for further testing.  5)  follow up headache clinic as scheduled and as needed       Flu Vaccine Consent Questions     Do you have a history of severe allergic reactions to this vaccine? no    Any prior history of allergic reactions to egg and/or gelatin? no    Do you have a sensitivity to the preservative Thimersol? no    Do you have a past history of Guillan-Barre Syndrome? no    Do you currently have an acute febrile illness? no    Have you ever had a severe reaction to latex? no    Vaccine information given and explained to patient? yes    Are you currently pregnant? no    Lot Number:[id number]   Exp Date:12/13/2010   Site Given  Left Deltoid IMu Abigail Miyamoto RN  March 14, 2010 11:35 AM

## 2010-07-15 NOTE — Progress Notes (Signed)
Summary: update regarding appt this morning  Phone Note Call from Patient Call back at Home Phone [phone]   Caller: Patient Call For: parrett Summary of Call: pt went to her eye doctor after she left and she has a fast growing cataract in her left eye and thats causing her blurry vision Initial call taken by: Lacinda Axon,  March 14, 2010 2:10 PM  Follow-up for Phone Call        called and spoke with pt.  pt states she was just seen today by TP for HA and vision problems and went to her eye dr today to make an appt but was actually able to be seen by eye dr today who informed her she has a "fast growing" cataract which is causing her vision problem.  Pt just wanted this message forwarded to TP as an FYI.  Arman Filter LPN  March 14, 2010 2:29 PM   Additional Follow-up for Phone Call Additional follow up Details #1::        spoke w/ pt   Additional Follow-up by: Rubye Oaks NP,  March 14, 2010 2:34 PM

## 2010-07-15 NOTE — Assessment & Plan Note (Signed)
Summary: NP ACUTE OV FOR CONSTIPATION   CC:  Pt c/o abdominal cramps, bloating, gas, and mucus like discharge in stools intermittent x 3 months.  History of Present Illness: 65 y/o WF with known history of Divertics,IBS,  and MHA.      ~  [name]:  she retired from [employer] for [employer] and has been doing some consulting work for them... she has been doing well overall but has had several concerns-  noted temp difference in her legs w/ cool RLE- pulses OK, neg eval from [facility]- DrFields, w/ norm art dopplers and ABI's... this was felt to be a result of her prev back surg affecting her sympathetic nerves to her right leg...  now c/o some swelling in her left leg- no pain, erythema, etc- no long trips, prev DVT, etc... she states left leg always sl larger than right, "but this is more so"... we discussed VenDopplers (neg for DVT), no salt, elevation, TED hose.   ~  Oct 24, 2009:  CPX>>drug holiday off fosamax  January 28, 2010--Presents for an acute office visit. She complains of abdominal cramps, bloating, gas, mucus like discharge in stools intermittent x 3 months. Last colonoscopy 5/09 by [name] showed divertics, no polyps.  She says symptoms are worse in am. We reviewed her diet , she eats healthy but low in fiber. She has no severe abd pain, fever, urinary symptoms, bloody stools. Denies chest pain, dyspnea, orthopnea, hemoptysis, fever, n/v/d, edema, headache,recent travel, or abx use. Has not taken any meds for treament.      Preventive Screening-Counseling & Management  Alcohol-Tobacco     Smoking Status: never  Medications Prior to Update: 1)  Travatan Z 0.004 %  Soln (Travoprost) .... Use Once Daily 2)  Azopt 1 %  Susp (Brinzolamide) .... Use One Drop in Each Eye Two Times A Day 3)  Restasis 0.05 %  Emul (Cyclosporine) .... Use One Drop in Each Eye Two Times A Day 4)  Adult Aspirin Low Strength 81 Mg  Tbdp (Aspirin) .... Take 1 Tablet By Mouth Once A Day 5)  Fish Oil 1000 Mg   Caps (Omega-3 Fatty Acids) .... Take 1  Tablets By Mouth Two Times A Day 6)  Atenolol 25 Mg  Tabs (Atenolol) .... Take 1 Tablet By Mouth Once A Day 7)  Zonegran 100 Mg Caps (Zonisamide) .[name].. 1 1/2  Capsules By Mouth Two Times A Day 8)  Frova 2.5 Mg  Tabs (Frovatriptan Succinate) .... As Needed 9)  Calcium Carbonate-Vitamin D 600-400 Mg-Unit  Tabs (Calcium Carbonate-Vitamin D) .... Take 1 Tablet By Mouth Once A Day 10)  Viactiv 500-100-40  Chew (Calcium-Vitamin D-Vitamin K) .... Once Daily 11)  Multivitamins   Tabs (Multiple Vitamin) .... Take 1 Tablet By Mouth Once A Day 12)  Vitamin C 500 Mg  Tabs (Ascorbic Acid) .... Take 1 Tablet By Mouth Once A Day 13)  Vitamin D 1000 Unit Tabs (Cholecalciferol) .... Take 1 Tablet By Mouth Once A Day  Current Medications (verified): 1)  Travatan Z 0.004 %  Soln (Travoprost) .... Use Once Daily 2)  Azopt 1 %  Susp (Brinzolamide) .... Use One Drop in Each Eye Two Times A Day 3)  Restasis 0.05 %  Emul (Cyclosporine) .... Use One Drop in Each Eye Two Times A Day 4)  Adult Aspirin Low Strength 81 Mg  Tbdp (Aspirin) .... Take 1 Tablet By Mouth Once A Day 5)  Fish Oil 1000 Mg  Caps (Omega-3 Fatty Acids) .Marland KitchenMarland KitchenMarland Kitchen  Take 1  Tablets By Mouth Two Times A Day 6)  Atenolol 25 Mg  Tabs (Atenolol) .... Take 1 Tablet By Mouth Once A Day 7)  Zonegran 100 Mg Caps (Zonisamide) .... 250mg  Once Daily 8)  Frova 2.5 Mg  Tabs (Frovatriptan Succinate) .... As Needed 9)  Calcium Carbonate-Vitamin D 600-400 Mg-Unit  Tabs (Calcium Carbonate-Vitamin D) .... Take 1 Tablet By Mouth Once A Day 10)  Viactiv 500-100-40  Chew (Calcium-Vitamin D-Vitamin K) .... Once Daily 11)  Multivitamins   Tabs (Multiple Vitamin) .... Take 1 Tablet By Mouth Once A Day 12)  Vitamin C 500 Mg  Tabs (Ascorbic Acid) .... Take 1 Tablet By Mouth Once A Day 13)  Vitamin D 1000 Unit Tabs (Cholecalciferol) .... Take 1 Tablet By Mouth Once A Day  Allergies (verified): 1)  ! * Wasp Venom  Past History:  Past  Medical History: Last updated: 10/24/2009 GLAUCOMA (ICD-365.9) Hx of TOXIC EFFECT OF VENOM (ICD-989.5) MITRAL VALVE PROLAPSE (ICD-424.0) PALPITATIONS (ICD-785.1) VENOUS INSUFFICIENCY (ICD-459.81) DIVERTICULOSIS OF COLON (ICD-562.10) Hx of IRRITABLE BOWEL SYNDROME (ICD-564.1) Hx of SHOULDER PAIN (ICD-719.41) Hx of SCOLIOSIS (ICD-737.30) OSTEOPENIA (ICD-733.90) Hx of MIGRAINE HEADACHE (ICD-346.90)  Past Surgical History: Last updated: 10/24/2009 S/P scoliosis surgery w/ rods in 1994 @ [facility] S/P rotator cuff surgery  Family History: Last updated: 10/24/2009 mother alive age 60  hx of DM father alive age 37 1 sibling alive age 80 1 sibling alive age 32  Social History: Last updated: 10/24/2009 never smoker married 2 children starting to exercise now  4 times weekly no caffiene use no alcohol use  Risk Factors: Smoking Status: never (01/28/2010)  Review of Systems      See HPI  Vital Signs:  Patient profile:   65 year old female Height:      64.5 inches Weight:      129.31 pounds BMI:     21.93 O2 Sat:      98 % on Room air Temp:     98.5 degrees F oral Pulse rate:   76 / minute BP sitting:   112 / 80  (left arm) Cuff size:   regular  Vitals Entered By: Zackery Barefoot CMA (January 28, 2010 11:46 AM)  O2 Flow:  Room air CC: Pt c/o abdominal cramps, bloating, gas, mucus like discharge in stools intermittent x 3 months Is Patient Diabetic? No Comments Medications reviewed with patient Verified contact number and pharmacy with patient Zackery Barefoot CMA  January 28, 2010 11:47 AM    Physical Exam  Additional Exam:  WD, WN, 65 y/o WF in NAD... GENERAL:  Alert & oriented; pleasant & cooperative... HEENT:  [place]/AT,   EACs-clear, TMs-wnl, NOSE-clear, THROAT-clear & wnl. NECK:  Supple w/ full ROM; no JVD; normal carotid impulses w/o bruits; no thyromegaly or nodules palpated; no lymphadenopathy. CHEST:  Clear to P & A; without wheezes/ rales/ or  rhonchi. HEART:  Regular Rhythm; without murmurs/ rubs/ or gallops heard... ABDOMEN:  Soft & nontender; normal bowel sounds; no organomegaly or masses detected.,no guarding or rebound noted.  BACK:  s/p scoliosis surg w/ rods etc... EXT: without deformities, mild arthritic changes; no varicose veins/ +venous insuffic on left/   NEURO:  CN's intact; motor testing normal; sensory testing normal; gait normal & balance OK. DERM:  nodules resolved.     Impression & Recommendations:  Problem # 1:  Hx of IRRITABLE BOWEL SYNDROME (ICD-564.1)  Flare of IBS w/ constipation, if not improving will need further eval and/or GI  referral  REC:  High fiber diet.with lots of fluids. May try yogurts-Activa, yoplait, etc  Add Gas X before meals. and at bedtime as needed gas.  May try ProBiotic once daily .  For constipation: Add stool softner at bedtime , and Miralax at bedtime 1 capful  for 1-2 weeks then as needed  Please contact office for sooner follow up if symptoms do not improve or worsen   Orders: Est. Patient Level III (11914)  Medications Added to Medication List This Visit: 1)  Zonegran 100 Mg Caps (Zonisamide) .... 250mg  once daily 2)  Miralax Powd (Polyethylene glycol 3350) .[name].. 1 capful at bedtime as needed costipation 3)  Stool Softener 100 Mg Caps (Docusate sodium) .[name].. 1 by mouth at bedtime as needed constipation 4)  Probiotic Formula Caps (Probiotic product) .[name].. 1 by mouth once daily  Complete Medication List: 1)  Travatan Z 0.004 % Soln (Travoprost) .... Use once daily 2)  Azopt 1 % Susp (Brinzolamide) .... Use one drop in each eye two times a day 3)  Restasis 0.05 % Emul (Cyclosporine) .... Use one drop in each eye two times a day 4)  Adult Aspirin Low Strength 81 Mg Tbdp (Aspirin) .... Take 1 tablet by mouth once a day 5)  Fish Oil 1000 Mg Caps (Omega-3 fatty acids) .... Take 1  tablets by mouth two times a day 6)  Atenolol 25 Mg Tabs (Atenolol) .... Take 1 tablet by mouth once  a day 7)  Zonegran 100 Mg Caps (Zonisamide) .... 250mg  once daily 8)  Frova 2.5 Mg Tabs (Frovatriptan succinate) .... As needed 9)  Calcium Carbonate-vitamin D 600-400 Mg-unit Tabs (Calcium carbonate-vitamin d) .... Take 1 tablet by mouth once a day 10)  Viactiv 500-100-40 Chew (Calcium-vitamin d-vitamin k) .... Once daily 11)  Multivitamins Tabs (Multiple vitamin) .... Take 1 tablet by mouth once a day 12)  Vitamin C 500 Mg Tabs (Ascorbic acid) .... Take 1 tablet by mouth once a day 13)  Vitamin D 1000 Unit Tabs (Cholecalciferol) .... Take 1 tablet by mouth once a day 14)  Miralax Powd (Polyethylene glycol 3350) .[name].. 1 capful at bedtime as needed costipation 15)  Stool Softener 100 Mg Caps (Docusate sodium) .[name].. 1 by mouth at bedtime as needed constipation 16)  Probiotic Formula Caps (Probiotic product) .[name].. 1 by mouth once daily  Patient Instructions: 1)  High fiber diet.with lots of fluids. May try yogurts-Activa, yoplait, etc  2)  Add Gas X before meals. and at bedtime as needed gas.  3)  May try ProBiotic once daily .  4)  For constipation: Add stool softner at bedtime , and Miralax at bedtime 1 capful  for 1-2 weeks then as needed  5)  Please contact office for sooner follow up if symptoms do not improve or worsen

## 2010-07-25 ENCOUNTER — Encounter: Payer: Self-pay | Admitting: Pulmonary Disease

## 2010-10-28 NOTE — Assessment & Plan Note (Signed)
OFFICE VISIT   Dana Massey, Dana Massey  DOB:  Jan 28, 1946                                       07/25/2008  EAVWU#:[id number]   The patient is a 65 year old female referred by Dr. Kriste Basque for possible  right foot ischemia.  She recently noticed several bumps on the dorsum  of her right foot.  She was seen by Dr. Kriste Basque and it was noted that her  right foot was cooler than the left.  He also had some difficulty  palpating pulses.  The patient states that her toes have been painful  intermittently since the bumps raised on the surface.  She denies any  history of claudication.  She denies any history of rest pain.  She has  no significant atherosclerotic risk factors.   She states that her right leg has been cooler than the left since 1994  after a back fusion.  She also states that her right foot has had a  bluish congested appearance with dependency since that time.  She  previously discussed this finding with her back surgeon who stated that  this sometimes will happen after a back operation.  Her primary concern  are the bumps on her toes.  She states that they sometimes have a  burning sensation.   PAST MEDICAL HISTORY:  Mitral valve prolapse, migraines, ocular  hypertension.   FAMILY HISTORY:  Is unremarkable.   SOCIAL HISTORY:  She is married.  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  She has two children.  She is a nonsmoker and not a consumer of alcohol.   REVIEW OF SYSTEMS:  CONSTITUTIONAL:  She is 5 feet 4 inches, 125 pounds.  CARDIAC:  She has occasional palpitations.  NEUROLOGIC:  She has migraine headaches.  Pulmonary, GI, renal, vascular, orthopedic, psychiatric, ENT and  hematologic review of systems are all negative.   MEDICATIONS:  1. Atenolol 25 mg once a day.  2. Fosamax 70 mg once a week.  3. Zonegran 250 mg once a day.  4. Frova 2.5 mg p.r.n. for migraines.  5. Restasis 1 drop in each eye twice daily.  6. Azopt 1 drop in each eye twice a  day.  7. Travatan Z 1 drop in each eye once daily.  8. Aspirin 81 mg once a day.  9. Multivitamin once a day.  10.Fish oil.  11.Vitamin D.  12.Vitamin C.  13.Calcium supplement.   ALLERGIES:  She has no known drug allergies.   PAST SURGICAL HISTORY:  She had a right rotator cuff repair and a back  fusion.   PHYSICAL EXAM:  Vital signs:  Blood pressure is 127/85 in the right arm,  pulse is 68 and regular.  HEENT:  Unremarkable.  Neck:  Has 2+ carotid  pulses without bruit.  Chest:  Clear to auscultation.  Cardiac:  Regular  rate and rhythm without murmur.  Abdomen:  Soft, nontender, nondistended  with no masses.  Extremities:  She has 2+ brachial, radial, femoral,  popliteal and posterior tibial pulses bilaterally.  I am unable to  palpate dorsalis pedis pulses bilaterally.  The right foot has a bluish  hue and congested appearance from the ankle down with dependency.  There  is no significant edema in either lower extremity.  There are no  ulcerations on the feet.  There are several papules scattered across the  dorsum of her right foot.  Each toe has 1-2 of these.  These are less  than 1 mm in diameter.  There is no significant erythema.  There is no  exudate.   She had bilateral ABIs performed today which were greater than 1  bilaterally.  The waveforms were triphasic bilaterally.   In summary, the patient has a rash on her right foot.  She has normal  ABIs and palpable pedal pulses.  The bluish cool sensation in her right  foot is chronic in nature dating back at least 10 years.  This is most  likely from some autonomic dysfunction related to her back operation.  I  do not believe this represents any ischemic compromise to her right  foot.  As far as the rash is concerned this appears to possibly be  fungal in nature.  I have recommended an antifungal cream for the next 2  weeks.  She will follow up after that.  If the rash has not resolved at  that time we will consider  sending her to a dermatologist.   Janetta Hora. Fields, MD  Electronically Signed   CEF/MEDQ  D:  07/26/2008  T:  07/26/2008  Job:  [id number]   cc:   Lonzo Cloud. Kriste Basque, MD

## 2010-10-29 ENCOUNTER — Other Ambulatory Visit: Payer: Self-pay | Admitting: Pulmonary Disease

## 2010-10-30 ENCOUNTER — Encounter: Payer: Self-pay | Admitting: Pulmonary Disease

## 2010-11-06 ENCOUNTER — Ambulatory Visit (INDEPENDENT_AMBULATORY_CARE_PROVIDER_SITE_OTHER): Payer: BC Managed Care – PPO | Admitting: Pulmonary Disease

## 2010-11-06 ENCOUNTER — Encounter: Payer: Self-pay | Admitting: Pulmonary Disease

## 2010-11-06 ENCOUNTER — Other Ambulatory Visit (INDEPENDENT_AMBULATORY_CARE_PROVIDER_SITE_OTHER): Payer: BC Managed Care – PPO

## 2010-11-06 ENCOUNTER — Ambulatory Visit (INDEPENDENT_AMBULATORY_CARE_PROVIDER_SITE_OTHER)
Admission: RE | Admit: 2010-11-06 | Discharge: 2010-11-06 | Disposition: A | Discharge status: Home / Self Care | Payer: BC Managed Care – PPO | Source: Ambulatory Visit | Attending: Pulmonary Disease | Admitting: Pulmonary Disease

## 2010-11-06 VITALS — BP 110/68 | HR 63 | Temp 97.1°F | Ht 64.5 in | Wt 129.8 lb

## 2010-11-06 DIAGNOSIS — M899 Disorder of bone, unspecified: Secondary | ICD-10-CM

## 2010-11-06 DIAGNOSIS — G43909 Migraine, unspecified, not intractable, without status migrainosus: Secondary | ICD-10-CM

## 2010-11-06 DIAGNOSIS — M4120 Other idiopathic scoliosis, site unspecified: Secondary | ICD-10-CM

## 2010-11-06 DIAGNOSIS — R002 Palpitations: Secondary | ICD-10-CM

## 2010-11-06 DIAGNOSIS — Z0000 Encounter for general adult medical examination without abnormal findings: Secondary | ICD-10-CM

## 2010-11-06 DIAGNOSIS — K589 Irritable bowel syndrome without diarrhea: Secondary | ICD-10-CM

## 2010-11-06 DIAGNOSIS — K5730 Diverticulosis of large intestine without perforation or abscess without bleeding: Secondary | ICD-10-CM

## 2010-11-06 DIAGNOSIS — I872 Venous insufficiency (chronic) (peripheral): Secondary | ICD-10-CM

## 2010-11-06 DIAGNOSIS — I059 Rheumatic mitral valve disease, unspecified: Secondary | ICD-10-CM

## 2010-11-06 DIAGNOSIS — M949 Disorder of cartilage, unspecified: Secondary | ICD-10-CM

## 2010-11-06 LAB — LIPID PANEL
Cholesterol: 181 mg/dL (ref 0–200)
LDL Cholesterol: 100 mg/dL — ABNORMAL HIGH (ref 0–99)
Total CHOL/HDL Ratio: 3

## 2010-11-06 LAB — HEPATIC FUNCTION PANEL
ALT: 14 U/L (ref 0–35)
AST: 20 U/L (ref 0–37)
Bilirubin, Direct: 0.1 mg/dL (ref 0.0–0.3)
Total Bilirubin: 0.6 mg/dL (ref 0.3–1.2)

## 2010-11-06 LAB — CBC WITH DIFFERENTIAL/PLATELET
Basophils Relative: 0.4 % (ref 0.0–3.0)
Eosinophils Absolute: 0.1 10*3/uL (ref 0.0–0.7)
MCHC: 34 g/dL (ref 30.0–36.0)
MCV: 98.2 fl (ref 78.0–100.0)
Monocytes Absolute: 0.3 10*3/uL (ref 0.1–1.0)
Neutrophils Relative %: 69 % (ref 43.0–77.0)
Platelets: 163 10*3/uL (ref 150.0–400.0)

## 2010-11-06 LAB — BASIC METABOLIC PANEL
Chloride: 105 mEq/L (ref 96–112)
Potassium: 4.7 mEq/L (ref 3.5–5.1)

## 2010-11-06 LAB — TSH: TSH: 1.43 u[IU]/mL (ref 0.35–5.50)

## 2010-11-06 NOTE — Patient Instructions (Signed)
Today we updated your med list in EPIC...    Continue your current medications the same...  Today we did your follow up CXR & fasting blood work...    Please call the PHONE TREE in a few days for your results...    Dial [id number] & when prompted enter your patient number followed by the # symbol...    Your patient number is:  [id number]  Stay as active as possible...  Call for any questions...  Let's plan another yearly follow up in 2013, but call sooner for any problems.Marland KitchenMarland Kitchen

## 2010-11-06 NOTE — Progress Notes (Signed)
Subjective:    Patient ID: Dana Massey, female    DOB: Nov 23, 1945, 65 y.o.   MRN: [id number]  HPI 65 y/o WF here for a follow up visit...  she has multiple medical problems as noted below...    ~  [name]:  she retired from [employer] for [employer] and has been doing some consulting work for them... she has been doing well overall but has had several concerns-  noted temp difference in her legs w/ cool RLE- pulses OK, neg eval from [facility]- DrFields, w/ norm art dopplers and ABI's... this was felt to be a result of her prev back surg affecting her sympathetic nerves to her right leg...  now c/o some swelling in her left leg- no pain, erythema, etc- no long trips, prev DVT, etc... she states left leg always sl larger than right, "but this is more so"... we discussed VenDopplers (neg for DVT), no salt, elevation, TED hose.  ~  Nov 06, 2010:  Yearly ROV & CPX> doing well overall w/o new complaints or concerns;  CXR, EKG, Fasting labs> all look good today;  Continue same meds...          Problem List:  GLAUCOMA (ICD-365.9) - on 3 eye drops per Optometrist, [name]... ~  S/p left cataract surg by [facility] 11/11...  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  MITRAL VALVE PROLAPSE (ICD-424.0) - on ASA 81mg /d... clinical Dx w/ hx of CP & palpit in the past, but none recently... intermittent click/ murmur heard... 2DEcho 3/07 was neg without MVP seen, plus norm wall motion and EF=55-60%... ~  Baseline EKG shows sinus brady, otherw WNL.Marland KitchenMarland Kitchen  PALPITATIONS (ICD-785.1) - none recently- on ATENOLOL 25mg /d... avoids caffeine, etc...  VENOUS INSUFFICIENCY (ICD-459.81) - she states left leg always sl > right leg... eval 4/10 w/ VenDopplers = neg for DVT... TED hose helps... ~  2/10 had right leg pain & coolness- eval by [facility] w/ normal ABI's & symptoms felt to be secondary to prev back surgery and sympathetic nerves to right leg... ~  5/12:  she notes some left leg edema> advised no salt, elevation, TED when able...  DIVERTICULOSIS OF COLON (ICD-562.10) - colonoscopy 1/01 by [name] showed divertics only... ~  f/u colonoscopy 5/09 by [name] showed divertics, no polyps... f/u rec in 65yrs.  Hx of IRRITABLE BOWEL SYNDROME (ICD-564.1) - no recent symptoms, doing well x mild constipation- uses MIRALAX & PROBIOTIC...  Hx of SHOULDER PAIN (ICD-719.41) - s/p rotator cuff surg at [facility]...  Hx of SCOLIOSIS (ICD-737.30) - s/p surgery at the [facility] in 1994 w rods etc to correct the scoliosis... this surg apparently affected the sympathetic nerves to her right leg w/ coolness & pain intermittently...  OSTEOPENIA (ICD-733.90) - BMD followed at [facility] on calcium, vits, vit D OTC per GYN... she stopped Fosamax on her own 2011 after ~2yrs Rx for a drug holiday... ~  BMD 09/16/07 showed Osteopenia w/ TScores --1.1 to -1.9.Marland KitchenMarland Kitchen ~  BMD 09/17/09 showed TScores -2.1 left FemNeck, & -1.1 left total forearm...  Hx of MIGRAINE HEADACHE (ICD-346.90) - extensive eval and f/u by DrFreeman... on ATENOLOL 25mg /d for prevention, on ZONEGRAN 250mg /d for prevention, uses FROVA 2.5mg  for Rx...  GYN = DrSuzMiller who does PAP, Mammogram, and checked her Vit D level - Rx w/ Vit D 50000 u weekly for awhile, now on OTC Rx.   Past Surgical History  Procedure Date  .  Back surgery 1994    scoliosis at [facility]  . Rotator cuff repair     Outpatient Encounter Prescriptions as of 11/06/2010  Medication Sig Dispense Refill  . aspirin 81 MG tablet Take 81 mg by mouth daily.        [name] atenolol (TENORMIN) 25 MG tablet Take 25 mg by mouth daily.        . brinzolamide (AZOPT) 1 % ophthalmic suspension Place 1 drop into both eyes 2 (two) times daily.       . Calcium Carbonate-Vitamin D (CALCIUM 600+D) 600-400 MG-UNIT per tablet Take 1 tablet by mouth daily.        . Calcium-Vitamin D-Vitamin K (VIACTIV) 500-100-40 MG-UNT-MCG CHEW Chew 1 capsule by  mouth daily.        . cholecalciferol (VITAMIN D) 1000 UNITS tablet Take 1,000 Units by mouth daily.        . cycloSPORINE (RESTASIS) 0.05 % ophthalmic emulsion Place 1 drop into both eyes 2 (two) times daily.        . fish oil-omega-3 fatty acids 1000 MG capsule Take 2 g by mouth daily.        . frovatriptan (FROVA) 2.5 MG tablet Take 2.5 mg by mouth as needed. If recurs, may repeat after 2 hours. Max of 3 tabs in 24 hours.       . Multiple Vitamins-Minerals (MULTIVITAMIN WITH MINERALS) tablet Take 1 tablet by mouth daily.        . polyethylene glycol powder (MIRALAX) powder Take 17 g by mouth daily.        . Probiotic Product (PROBIOTIC FORMULA PO) Take 1 capsule by mouth daily.        . travoprost, benzalkonium, (TRAVATAN) 0.004 % ophthalmic solution Place 1 drop into both eyes at bedtime.        . vitamin C (ASCORBIC ACID) 500 MG tablet Take 500 mg by mouth daily.        [name] zonisamide (ZONEGRAN) 100 MG capsule Take 300mg  daily at bedtime        No Known Allergies   Review of Systems        The patient complains of back pain and stiffness.  The patient denies fever, chills, sweats, anorexia, fatigue, weakness, malaise, weight loss, sleep disorder, blurring, diplopia, eye irritation, eye discharge, vision loss, eye pain, photophobia, earache, ear discharge, tinnitus, decreased hearing, nasal congestion, nosebleeds, sore throat, hoarseness, chest pain, palpitations, syncope, dyspnea on exertion, orthopnea, PND, peripheral edema, cough, dyspnea at rest, excessive sputum, hemoptysis, wheezing, pleurisy, nausea, vomiting, diarrhea, constipation, change in bowel habits, abdominal pain, melena, hematochezia, jaundice, gas/bloating, indigestion/heartburn, dysphagia, odynophagia, dysuria, hematuria, urinary frequency, urinary hesitancy, nocturia, incontinence, joint pain, joint swelling, muscle cramps, muscle weakness, arthritis, sciatica, restless legs, leg pain at night, leg pain with exertion, rash,  itching, dryness, suspicious lesions, paralysis, paresthesias, seizures, tremors, vertigo, transient blindness, frequent falls, frequent headaches, difficulty walking, depression, anxiety, memory loss, confusion, cold intolerance, heat intolerance, polydipsia, polyphagia, polyuria, unusual weight change, abnormal bruising, bleeding, enlarged lymph nodes, urticaria, allergic rash, hay fever, and recurrent infections.     Objective:   Physical Exam     WD, WN, 65 y/o WF in NAD... GENERAL:  Alert & oriented; pleasant & cooperative... HEENT:  [place]/AT, EOM-full, PERRLA, Fundi- not vis, EACs-clear, TMs-wnl, NOSE-clear, THROAT-clear & wnl. NECK:  Supple w/ fairROM; no JVD; normal carotid impulses w/o bruits; no thyromegaly or nodules palpated; no lymphadenopathy. CHEST:  Clear to P & A; without wheezes/ rales/  or rhonchi heard; she has a left thoracotomy scan related to her back surg. HEART:  Regular Rhythm; without murmurs/ rubs/ or gallops detected... ABDOMEN:  Soft & nontender; normal bowel sounds; no organomegaly or masses palpated... BACK:  s/p scoliosis surg w/ rods etc; long midline back scar... EXT: without deformities, mild arthritic changes; no varicose veins/ +venous insuffic on left>right, neg homan's etc. NEURO:  CN's intact; motor testing normal; sensory testing normal; gait normal & balance OK. DERM:  no lesions noted...   Assessment & Plan:   CPX>  Doing well overall;  CXR/ EKG, Labs- all look good...  Cardiac>  Hx ?MVP, palpit> stable on Atenolol 25mg /d...  Ven Insuffic>  She knows to avoid salt, elev legs, wear support hose...  GI>  She is up to date on colon checks, uses Miralax & Probiotics regularly...  Scoliosis> s/p extensive surg 39 & doing satis overall...  Osteopenia>  BMD followed by GYN & she is currently off Fosamax for drug holiday...  Migraines>  Prev eval from [facility], stable on Aten/ Zonegran/ Frova.Marland KitchenMarland Kitchen

## 2010-11-07 LAB — VITAMIN D 25 HYDROXY (VIT D DEFICIENCY, FRACTURES): Vit D, 25-Hydroxy: 91 ng/mL — ABNORMAL HIGH (ref 30–89)

## 2010-11-09 ENCOUNTER — Encounter: Payer: Self-pay | Admitting: Pulmonary Disease

## 2010-11-20 ENCOUNTER — Telehealth: Payer: Self-pay | Admitting: Pulmonary Disease

## 2010-11-20 DIAGNOSIS — I059 Rheumatic mitral valve disease, unspecified: Secondary | ICD-10-CM

## 2010-11-20 NOTE — Telephone Encounter (Signed)
ATC pt at home # x 3.  Line busy.  WCB 

## 2010-11-20 NOTE — Telephone Encounter (Signed)
Called, spoke with pt.  States she was seen by eye dr yesterday for visual fields test.  States he noticed a cotton wool spot in her left eye and rec that she call SN to have a carotid study set up.  Dr. Kriste Basque, pls advise.  Thanks!

## 2010-11-20 NOTE — Telephone Encounter (Signed)
Per SN---please order c dopplers at [facility]---this order has been placed.  We need the name of her eye doctor and get him to send his ov note over. thanks

## 2010-11-20 NOTE — Telephone Encounter (Signed)
ATC pt at home #.  Was told I had the wrong # and got hung up on.  LMOM at work # for pt TCB

## 2010-11-21 NOTE — Telephone Encounter (Signed)
Called and spoke with pt.  Informed her of SN's response- Pt states she has already been called and has appt scheduled for Dopplers.  Pt states her eye dr is Dr. Fredrich Birks at [phone].  Called and spoke with Wynona Canes at Dr. Roby Lofts office and requested records.  Wynona Canes stated she could fax over the visual fields test but could not fax over the office notes as Dr. Lorin Picket was out of the office today and would not return until next week.  Informed Christine to fax over the visual fields ( gave her the # to the triage fax) and asked her to fax the office notes when Dr. Lorin Picket returned to the office, which Wynona Canes stated she would do.  Will forward message to Marliss Czar so she can keep a look out for these records.

## 2010-11-24 NOTE — Telephone Encounter (Signed)
Called spoke with pt's husband.  Informed him that we did received the notes from pt's eye doctor and that we will call when we get the results back from her doppler sched on 6.26.12.  Pt'h husband verbalized his understanding and denied any further questions.  Encouraged him to have pt call with any further questions / concerns.

## 2010-11-24 NOTE — Telephone Encounter (Signed)
Papers have been received from [facility].  Given SN these papers to review.  thanks

## 2010-12-05 ENCOUNTER — Other Ambulatory Visit: Payer: Self-pay | Admitting: Pulmonary Disease

## 2010-12-05 ENCOUNTER — Encounter: Payer: BC Managed Care – PPO | Admitting: *Deleted

## 2010-12-05 DIAGNOSIS — R0989 Other specified symptoms and signs involving the circulatory and respiratory systems: Secondary | ICD-10-CM

## 2010-12-09 ENCOUNTER — Encounter: Payer: Self-pay | Admitting: Pulmonary Disease

## 2010-12-09 ENCOUNTER — Ambulatory Visit (INDEPENDENT_AMBULATORY_CARE_PROVIDER_SITE_OTHER): Payer: BC Managed Care – PPO | Admitting: *Deleted

## 2010-12-09 DIAGNOSIS — I059 Rheumatic mitral valve disease, unspecified: Secondary | ICD-10-CM

## 2010-12-09 DIAGNOSIS — R0989 Other specified symptoms and signs involving the circulatory and respiratory systems: Secondary | ICD-10-CM

## 2010-12-16 ENCOUNTER — Telehealth: Payer: Self-pay | Admitting: *Deleted

## 2010-12-16 NOTE — Telephone Encounter (Signed)
Called and spoke with pt and she is aware that the doppler studies are completely normal.  Pt voiced her understanding and she is aware that a copy has been sent to her eye doctor Dr. Lorin Picket.

## 2011-05-26 ENCOUNTER — Ambulatory Visit (INDEPENDENT_AMBULATORY_CARE_PROVIDER_SITE_OTHER): Payer: BC Managed Care – PPO

## 2011-05-26 DIAGNOSIS — Z23 Encounter for immunization: Secondary | ICD-10-CM

## 2011-07-08 ENCOUNTER — Other Ambulatory Visit: Payer: Self-pay | Admitting: *Deleted

## 2011-07-08 MED ORDER — ATENOLOL 25 MG PO TABS: 25.0000 mg | ORAL_TABLET | Freq: Every day | ORAL | Status: DC

## 2011-10-02 ENCOUNTER — Other Ambulatory Visit: Payer: Self-pay | Admitting: Pulmonary Disease

## 2011-10-02 MED ORDER — ATENOLOL 25 MG PO TABS: 25.0000 mg | ORAL_TABLET | Freq: Every day | ORAL | Status: DC

## 2011-11-20 ENCOUNTER — Encounter: Payer: Self-pay | Admitting: Pulmonary Disease

## 2011-11-20 ENCOUNTER — Ambulatory Visit (INDEPENDENT_AMBULATORY_CARE_PROVIDER_SITE_OTHER): Payer: BC Managed Care – PPO | Admitting: Pulmonary Disease

## 2011-11-20 ENCOUNTER — Other Ambulatory Visit (INDEPENDENT_AMBULATORY_CARE_PROVIDER_SITE_OTHER): Payer: BC Managed Care – PPO

## 2011-11-20 VITALS — BP 114/70 | HR 60 | Temp 97.2°F | Ht 64.5 in | Wt 123.2 lb

## 2011-11-20 DIAGNOSIS — E559 Vitamin D deficiency, unspecified: Secondary | ICD-10-CM

## 2011-11-20 DIAGNOSIS — F411 Generalized anxiety disorder: Secondary | ICD-10-CM

## 2011-11-20 DIAGNOSIS — K5730 Diverticulosis of large intestine without perforation or abscess without bleeding: Secondary | ICD-10-CM

## 2011-11-20 DIAGNOSIS — I872 Venous insufficiency (chronic) (peripheral): Secondary | ICD-10-CM

## 2011-11-20 DIAGNOSIS — F419 Anxiety disorder, unspecified: Secondary | ICD-10-CM

## 2011-11-20 DIAGNOSIS — K589 Irritable bowel syndrome without diarrhea: Secondary | ICD-10-CM

## 2011-11-20 DIAGNOSIS — M899 Disorder of bone, unspecified: Secondary | ICD-10-CM

## 2011-11-20 DIAGNOSIS — M4120 Other idiopathic scoliosis, site unspecified: Secondary | ICD-10-CM

## 2011-11-20 DIAGNOSIS — I059 Rheumatic mitral valve disease, unspecified: Secondary | ICD-10-CM

## 2011-11-20 DIAGNOSIS — G43909 Migraine, unspecified, not intractable, without status migrainosus: Secondary | ICD-10-CM

## 2011-11-20 DIAGNOSIS — Z23 Encounter for immunization: Secondary | ICD-10-CM

## 2011-11-20 LAB — BASIC METABOLIC PANEL
BUN: 21 mg/dL (ref 6–23)
Calcium: 9.2 mg/dL (ref 8.4–10.5)
Chloride: 108 mEq/L (ref 96–112)
Creatinine, Ser: 1 mg/dL (ref 0.4–1.2)

## 2011-11-20 LAB — URINALYSIS
Nitrite: NEGATIVE
Total Protein, Urine: NEGATIVE
pH: 7 (ref 5.0–8.0)

## 2011-11-20 LAB — LIPID PANEL
Cholesterol: 191 mg/dL (ref 0–200)
HDL: 70.4 mg/dL (ref >39.00)
LDL Cholesterol: 102 mg/dL — ABNORMAL HIGH (ref 0–99)
Total CHOL/HDL Ratio: 3
Triglycerides: 92 mg/dL (ref 0.0–149.0)
VLDL: 18.4 mg/dL (ref 0.0–40.0)

## 2011-11-20 LAB — CBC WITH DIFFERENTIAL/PLATELET
Basophils Relative: 0.6 % (ref 0.0–3.0)
Eosinophils Relative: 1 % (ref 0.0–5.0)
Hemoglobin: 14.8 g/dL (ref 12.0–15.0)
Lymphocytes Relative: 16.5 % (ref 12.0–46.0)
MCV: 97.6 fl (ref 78.0–100.0)
Neutro Abs: 5.4 10*3/uL (ref 1.4–7.7)
Neutrophils Relative %: 77.3 % — ABNORMAL HIGH (ref 43.0–77.0)
RBC: 4.59 Mil/uL (ref 3.87–5.11)
WBC: 7.1 10*3/uL (ref 4.5–10.5)

## 2011-11-20 LAB — HEPATIC FUNCTION PANEL
ALT: 13 U/L (ref 0–35)
Total Bilirubin: 0.5 mg/dL (ref 0.3–1.2)

## 2011-11-20 NOTE — Patient Instructions (Signed)
Today we updated your med list in our EPIC system...    Continue your current medications the same...  Today we did your follow up FASTING blood work...    We will call you w/ the results when avail...  We also gave you the PNEUMOVAX today (currently indicated as one shot after age 65 for most people)...  Call for any problems.Marland KitchenMarland Kitchen

## 2011-11-20 NOTE — Progress Notes (Signed)
Subjective:    Patient ID: Dana Massey, female    DOB: Jun 22, 1945, 66 y.o.   MRN: [id number]  HPI 66 y/o WF here for a follow up visit...  she has multiple medical problems as noted below...    ~  [name]:  she retired from [employer] for [employer] and has been doing some consulting work for them... she has been doing well overall but has had several concerns-  noted temp difference in her legs w/ cool RLE- pulses OK, neg eval from [facility]- DrFields, w/ norm art dopplers and ABI's... this was felt to be a result of her prev back surg affecting her sympathetic nerves to her right leg...  now c/o some swelling in her left leg- no pain, erythema, etc- no long trips, prev DVT, etc... she states left leg always sl larger than right, "but this is more so"... we discussed VenDopplers (neg for DVT), no salt, elevation, TED hose.  ~  Nov 06, 2010:  Yearly ROV & CPX> doing well overall w/o new complaints or concerns;  CXR, EKG, Fasting labs> all look good today;  Continue same meds...  ~  November 20, 2011:  Yearly ROV> Dana Massey has had a good yr other than her migraine HAs which have increased recently; she is followed by [facility]Freeman[facility] clinic & he has adjusted her Zonegran up to 200mg Bid & she continues on the [facility]; last year her Optometrist, [name], found a cotton wool spot in her fundus & called requesting Carotid dopplers which were performed 6/12 & WNL- no plaque, normal flow...    We reviewed prob list, meds, xrays and labs> see below>>  PNEUMOVAX given today... LABS 6/13:  FLP- at goals on diet;  Chems- wnl;  CBC- wnl;  TSH=1.54;  VitD=65;  UA- clear...          Problem List:  GLAUCOMA (ICD-365.9) - on 3 eye drops per Optometrist, [name] who reports hx Glaucoma, dry eyes, cataracts... ~  S/p left cataract surg by [facility] 11/11... ~  [name] saw a cotton wool exud in left fundus; CDoppler's at his request 6/12 were WNL- no plaque, normal flows.  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  MITRAL VALVE PROLAPSE (ICD-424.0) - on ASA 81mg /d... clinical Dx w/ hx of CP & palpit in the past, but none recently... intermittent click/ murmur heard... 2DEcho 3/07 was neg without MVP seen, plus norm wall motion and EF=55-60%... ~  Baseline EKG shows sinus brady, otherw WNL.Marland KitchenMarland Kitchen  PALPITATIONS (ICD-785.1) - none recently- on ATENOLOL 25mg /d... avoids caffeine, etc...  VENOUS INSUFFICIENCY (ICD-459.81) - she states left leg always sl > right leg... eval 4/10 w/ VenDopplers = neg for DVT... TED hose helps... ~  2/10 had right leg pain & coolness- eval by [facility] w/ normal ABI's & symptoms felt to be secondary to prev back surgery and sympathetic nerves to right leg... ~  5/12: she notes some left leg edema> advised no salt, elevation, TED when able...  DIVERTICULOSIS OF COLON (ICD-562.10) - colonoscopy 1/01 by [name] showed divertics only... ~  f/u colonoscopy 5/09 by [name] showed divertics, no polyps... f/u rec in 37yrs.  Hx of IRRITABLE BOWEL SYNDROME (ICD-564.1) - no recent symptoms, doing well x mild constipation- uses MIRALAX & PROBIOTIC...  Hx of SHOULDER PAIN (ICD-719.41) - s/p rotator cuff surg at [facility]...  Hx of SCOLIOSIS (ICD-737.30) - s/p surgery at the [facility] in 1994 w rods  etc to correct the scoliosis... this surg apparently affected the sympathetic nerves to her right leg w/ coolness & pain intermittently...  OSTEOPENIA (ICD-733.90) - BMD followed at [facility] on Calcium, vits, vit D OTC per GYN... she stopped Fosamax on her own 2011 after ~90yrs Rx for a drug holiday... ~  BMD 09/16/07 showed Osteopenia w/ TScores --1.1 to -1.9.Marland KitchenMarland Kitchen ~  BMD 09/17/09 showed TScores -2.1 left FemNeck, & -1.1 left total forearm... ~  BMD 4/13 >> pending scan into EPIC ~  Labs 6/13 showed VitD level = 65  Hx of MIGRAINE HEADACHE (ICD-346.90) - extensive eval and f/u by DrFreeman... on ATENOLOL 25mg /d for prevention, on ZONEGRAN  200mg Bid for prevention, uses FROVA 2.5mg  for Rx... ~  6/13: recent incr in migraine frequency has her concerned 7 we discussed options; DrFreeman increased her Zonegran dose...  HEALTH MAINTENANCE: ~  GI> [name], last colon 5/09 w/ divertics, no polyps, f/u 35yrs. ~  GYN> DrSuzMiller who does PAP, Mammogram, and checked her Vit D level - Rx w/ Vit D 50000 u weekly for awhile, now on OTC Rx. ~  Immuniz> she gets the seasonal Flu vaccine yearly;  PNEUMOVAX given 6/13 (age 71);  She had Tetanus shot ?2011;  We discussed Shingles vaccine.   Past Surgical History  Procedure Date  . Back surgery 1994    scoliosis at [facility]  . Rotator cuff repair     Outpatient Encounter Prescriptions as of 11/20/2011  Medication Sig Dispense Refill  . aspirin 81 MG tablet Take 81 mg by mouth daily.        [name] atenolol (TENORMIN) 25 MG tablet Take 1 tablet (25 mg total) by mouth daily.  90 tablet  0  . brinzolamide (AZOPT) 1 % ophthalmic suspension Place 1 drop into both eyes 2 (two) times daily.       . Calcium-Vitamin D-Vitamin K (VIACTIV) 500-100-40 MG-UNT-MCG CHEW Chew 1 capsule by mouth daily.        [name] conjugated estrogens (PREMARIN) vaginal cream 1/2 gram 2 times per week      . cycloSPORINE (RESTASIS) 0.05 % ophthalmic emulsion Place 1 drop into both eyes 2 (two) times daily.        . fish oil-omega-3 fatty acids 1000 MG capsule Take 2 g by mouth daily.        . frovatriptan (FROVA) 2.5 MG tablet Take 2.5 mg by mouth as needed. If recurs, may repeat after 2 hours. Max of 3 tabs in 24 hours.       . Multiple Vitamins-Minerals (MULTIVITAMIN WITH MINERALS) tablet Take 1 tablet by mouth daily.        . polyethylene glycol powder (MIRALAX) powder Take 17 g by mouth daily.        . Probiotic Product (PROBIOTIC FORMULA PO) Take 1 capsule by mouth daily.        . travoprost, benzalkonium, (TRAVATAN) 0.004 % ophthalmic solution Place 1 drop into both eyes at bedtime.        . vitamin C (ASCORBIC ACID) 500  MG tablet Take 500 mg by mouth daily.        [name] zonisamide (ZONEGRAN) 100 MG capsule Take 200mg   Twice daily      . DISCONTD: Calcium Carbonate-Vitamin D (CALCIUM 600+D) 600-400 MG-UNIT per tablet Take 1 tablet by mouth daily.        [name] DISCONTD: cholecalciferol (VITAMIN D) 1000 UNITS tablet Take 1,000 Units by mouth daily.        [name] DISCONTD:  zonisamide (ZONEGRAN) 100 MG capsule Take 300mg  daily at bedtime        No Known Allergies   Review of Systems        The patient notes increased freq of her migraine HAs; The patient denies fever, chills, sweats, anorexia, fatigue, weakness, malaise, weight loss, sleep disorder, blurring, diplopia, eye irritation, eye discharge, vision loss, eye pain, photophobia, earache, ear discharge, tinnitus, decreased hearing, nasal congestion, nosebleeds, sore throat, hoarseness, chest pain, palpitations, syncope, dyspnea on exertion, orthopnea, PND, peripheral edema, cough, dyspnea at rest, excessive sputum, hemoptysis, wheezing, pleurisy, nausea, vomiting, diarrhea, constipation, change in bowel habits, abdominal pain, melena, hematochezia, jaundice, gas/bloating, indigestion/heartburn, dysphagia, odynophagia, dysuria, hematuria, urinary frequency, urinary hesitancy, nocturia, incontinence, joint pain, joint swelling, back pain, muscle cramps, muscle weakness, arthritis, sciatica, restless legs, leg pain at night, leg pain with exertion, rash, itching, dryness, suspicious lesions, paralysis, paresthesias, seizures, tremors, vertigo, transient blindness, frequent falls, difficulty walking, depression, anxiety, memory loss, confusion, cold intolerance, heat intolerance, polydipsia, polyphagia, polyuria, unusual weight change, abnormal bruising, bleeding, enlarged lymph nodes, urticaria, allergic rash, hay fever, and recurrent infections.     Objective:   Physical Exam     WD, WN, 66 y/o WF in NAD... GENERAL:  Alert & oriented; pleasant & cooperative... HEENT:  [place]/AT,  EOM-full, PERRLA, Fundi- not vis, EACs-clear, TMs-wnl, NOSE-clear, THROAT-clear & wnl. NECK:  Supple w/ fairROM; no JVD; normal carotid impulses w/o bruits; no thyromegaly or nodules palpated; no lymphadenopathy. CHEST:  Clear to P & A; without wheezes/ rales/ or rhonchi heard; she has a left thoracotomy scan related to her back surg. HEART:  Regular Rhythm; without murmurs/ rubs/ or gallops detected... ABDOMEN:  Soft & nontender; normal bowel sounds; no organomegaly or masses palpated... BACK:  s/p scoliosis surg w/ rods etc; long midline back scar... EXT: without deformities, mild arthritic changes; no varicose veins/ +venous insuffic on left>right, neg homan's etc. NEURO:  CN's intact; motor testing normal; sensory testing normal; gait normal & balance OK. DERM:  no lesions noted...  RADIOLOGY DATA:  Reviewed in the EPIC EMR & discussed w/ the patient...  LABORATORY DATA:  Reviewed in the EPIC EMR & discussed w/ the patient...   Assessment & Plan:    Cardiac> Hx ?MVP, palpit> stable on Atenolol 25mg /d...  Ven Insuffic>  She knows to avoid salt, elev legs, wear support hose prn...  GI>  She is up to date on colon checks, uses Miralax & Probiotics regularly...  Scoliosis> s/p extensive surg 24 & doing satis overall & she currently denies LBP...  Osteopenia>  BMD followed by GYN & she is currently off Fosamax for drug holiday...  Migraines>  Prev eval from [facility], on Aten/ Zonegran/ Frova but recent incr in pain has concerned her & she will consider her next step...   Patient's Medications  New Prescriptions   No medications on file  Previous Medications   ASPIRIN 81 MG TABLET    Take 81 mg by mouth daily.     ATENOLOL (TENORMIN) 25 MG TABLET    Take 1 tablet (25 mg total) by mouth daily.   BRINZOLAMIDE (AZOPT) 1 % OPHTHALMIC SUSPENSION    Place 1 drop into both eyes 2 (two) times daily.    CALCIUM-VITAMIN D-VITAMIN K (VIACTIV) 500-100-40 MG-UNT-MCG CHEW    Chew 1  capsule by mouth daily.     CONJUGATED ESTROGENS (PREMARIN) VAGINAL CREAM    1/2 gram 2 times per week   CYCLOSPORINE (RESTASIS) 0.05 % OPHTHALMIC EMULSION  Place 1 drop into both eyes 2 (two) times daily.     FISH OIL-OMEGA-3 FATTY ACIDS 1000 MG CAPSULE    Take 2 g by mouth daily.     FROVATRIPTAN (FROVA) 2.5 MG TABLET    Take 2.5 mg by mouth as needed. If recurs, may repeat after 2 hours. Max of 3 tabs in 24 hours.    MULTIPLE VITAMINS-MINERALS (MULTIVITAMIN WITH MINERALS) TABLET    Take 1 tablet by mouth daily.     POLYETHYLENE GLYCOL POWDER (MIRALAX) POWDER    Take 17 g by mouth daily.     PROBIOTIC PRODUCT (PROBIOTIC FORMULA PO)    Take 1 capsule by mouth daily.     TRAVOPROST, BENZALKONIUM, (TRAVATAN) 0.004 % OPHTHALMIC SOLUTION    Place 1 drop into both eyes at bedtime.     VITAMIN C (ASCORBIC ACID) 500 MG TABLET    Take 500 mg by mouth daily.     ZONISAMIDE (ZONEGRAN) 100 MG CAPSULE    Take 200mg   Twice daily  Modified Medications   No medications on file  Discontinued Medications   CALCIUM CARBONATE-VITAMIN D (CALCIUM 600+D) 600-400 MG-UNIT PER TABLET    Take 1 tablet by mouth daily.     CHOLECALCIFEROL (VITAMIN D) 1000 UNITS TABLET    Take 1,000 Units by mouth daily.     ZONISAMIDE (ZONEGRAN) 100 MG CAPSULE    Take 300mg  daily at bedtime

## 2011-11-21 LAB — VITAMIN D 25 HYDROXY (VIT D DEFICIENCY, FRACTURES): Vit D, 25-Hydroxy: 65 ng/mL (ref 30–89)

## 2011-12-14 ENCOUNTER — Telehealth: Payer: Self-pay | Admitting: Pulmonary Disease

## 2011-12-14 DIAGNOSIS — R51 Headache: Secondary | ICD-10-CM

## 2011-12-14 NOTE — Telephone Encounter (Signed)
Signed by mistake.  Please make pt aware that order has been placed.  thanks

## 2011-12-14 NOTE — Telephone Encounter (Signed)
Pt aware. Dana Massey, CMA  

## 2011-12-14 NOTE — Telephone Encounter (Signed)
I spoke with pt and she states SN had mentioned referring her to neuro for her HA. Pt would like to go ahead and get this set up. Please advise Dr. Kriste Basque, thanks

## 2011-12-14 NOTE — Telephone Encounter (Signed)
Per SN---ok to refer to headache management clinic for eval of headaches.  Order has been placed.  thanks

## 2011-12-21 ENCOUNTER — Telehealth: Payer: Self-pay | Admitting: Pulmonary Disease

## 2011-12-21 NOTE — Telephone Encounter (Signed)
Called and spoke with pt and explained the procedure with her.  She is aware that all papers have been faxed and to call us next week if she has not heard from them yet.  Pt voiced her understanding of this and nothing further needed.

## 2012-01-05 ENCOUNTER — Other Ambulatory Visit: Payer: Self-pay | Admitting: Pulmonary Disease

## 2012-01-05 NOTE — Telephone Encounter (Signed)
Received faxed refill request for Atenolol 25 mg tablet from [facility]. Patient last seen 11/20/11. Dr Kriste Basque please advise if ok to refill. Thanks.

## 2012-03-22 ENCOUNTER — Ambulatory Visit (INDEPENDENT_AMBULATORY_CARE_PROVIDER_SITE_OTHER): Payer: BC Managed Care – PPO

## 2012-03-22 DIAGNOSIS — Z23 Encounter for immunization: Secondary | ICD-10-CM

## 2012-07-15 LAB — BASIC METABOLIC PANEL
Anion Gap: 6 — ABNORMAL LOW (ref 7–16)
Calcium, Total: 9 mg/dL (ref 8.5–10.1)
EGFR (African American): >60
Glucose: 131 mg/dL — ABNORMAL HIGH (ref 65–99)
Sodium: 141 mmol/L (ref 136–145)

## 2012-07-15 LAB — CBC: RDW: 13.2 % (ref 11.5–14.5)

## 2012-07-15 LAB — TROPONIN I: Troponin-I: <0.02 ng/mL

## 2012-07-16 ENCOUNTER — Observation Stay: Payer: Self-pay | Admitting: Internal Medicine

## 2012-07-19 ENCOUNTER — Encounter: Payer: Self-pay | Admitting: Certified Nurse Midwife

## 2012-07-20 ENCOUNTER — Encounter: Payer: Self-pay | Admitting: Certified Nurse Midwife

## 2012-09-06 ENCOUNTER — Encounter: Payer: Self-pay | Admitting: Certified Nurse Midwife

## 2012-09-06 ENCOUNTER — Ambulatory Visit (INDEPENDENT_AMBULATORY_CARE_PROVIDER_SITE_OTHER): Payer: BC Managed Care – PPO | Admitting: Certified Nurse Midwife

## 2012-09-06 VITALS — BP 100/64 | Ht 64.25 in | Wt 123.0 lb

## 2012-09-06 DIAGNOSIS — Z01419 Encounter for gynecological examination (general) (routine) without abnormal findings: Secondary | ICD-10-CM

## 2012-09-06 DIAGNOSIS — N952 Postmenopausal atrophic vaginitis: Secondary | ICD-10-CM

## 2012-09-06 MED ORDER — ESTROGENS, CONJUGATED 0.625 MG/GM VA CREA: TOPICAL_CREAM | VAGINAL | Status: AC

## 2012-09-06 NOTE — Patient Instructions (Addendum)

## 2012-09-06 NOTE — Progress Notes (Signed)
67 y.o. MarriedCaucasian female   G2P2 here for annual exam. Menopausal with no HRT use.  Denies vaginal dryness using Premarin vaginal cream 2 x weekly.  Denies vaginal bleeding. Sees PCP for labs and aex.  Reports no health problems today.  Spouse will retire at the end of the week.    Patient's last menstrual period was 09/13/2000.          Sexually active: yes  The current method of family planning is vasectomy.    Exercising: no  exercise Last mammogram: 2/14 Last colonoscopy 2009   Last pap: 08/19/10  Last BMD: 2013 per patient Alcohol: no Tobacco: no   Health Maintenance  Topic Date Due  . Tetanus/tdap  09/11/1965  . Mammogram  09/11/1996  . Colonoscopy  09/11/1996  . Zostavax  09/12/2006  . Influenza Vaccine  02/13/2013  . Pneumococcal Polysaccharide Vaccine Age 57 And Over  Completed    Family History  Problem Relation Age of Onset  . Diabetes Mother     Patient Active Problem List  Diagnosis  . MIGRAINE HEADACHE  . GLAUCOMA  . MITRAL VALVE PROLAPSE  . VENOUS INSUFFICIENCY  . ACUTE PHARYNGITIS  . DIVERTICULOSIS OF COLON  . IRRITABLE BOWEL SYNDROME  . SHOULDER PAIN  . FOOT PAIN, RIGHT  . OSTEOPENIA  . SCOLIOSIS  . PALPITATIONS  . TOXIC EFFECT OF VENOM  . Physical exam, annual    Past Medical History  Diagnosis Date  . Glaucoma(365)   . Mitral valve prolapse   . Palpitations   . Venous insufficiency   . Diverticulosis of colon   . IBS (irritable bowel syndrome)   . Shoulder pain   . Osteopenia   . Migraine headache   . Scoliosis   . Toxic effect of venom     Past Surgical History  Procedure Laterality Date  . Back surgery  1994    scoliosis at [facility]  . Rotator cuff repair    . Cataract extraction  11/11    left    Allergies: Review of patient's allergies indicates no known allergies.  Current Outpatient Prescriptions  Medication Sig Dispense Refill  . aspirin 81 MG tablet Take 81 mg by mouth daily.        [name] atenolol  (TENORMIN) 25 MG tablet Take 1 tablet (25 mg total) by mouth daily.  90 tablet  0  . brinzolamide (AZOPT) 1 % ophthalmic suspension Place 1 drop into both eyes 2 (two) times daily.       . Calcium-Vitamin D-Vitamin K (VIACTIV) 500-100-40 MG-UNT-MCG CHEW Chew 1 capsule by mouth daily.        [name] conjugated estrogens (PREMARIN) vaginal cream 1/2 gram 2 times per week      . cycloSPORINE (RESTASIS) 0.05 % ophthalmic emulsion Place 1 drop into both eyes 2 (two) times daily.        . fish oil-omega-3 fatty acids 1000 MG capsule Take 2 g by mouth daily.        . frovatriptan (FROVA) 2.5 MG tablet Take 2.5 mg by mouth as needed. If recurs, may repeat after 2 hours. Max of 3 tabs in 24 hours.       [name] MELATONIN PO Take 6 mg by mouth.      . Multiple Vitamins-Minerals (MULTIVITAMIN WITH MINERALS) tablet Take 1 tablet by mouth daily.        . polyethylene glycol powder (MIRALAX) powder Take 17 g by mouth daily.        [name]  travoprost, benzalkonium, (TRAVATAN) 0.004 % ophthalmic solution Place 1 drop into both eyes at bedtime.        . vitamin C (ASCORBIC ACID) 500 MG tablet Take 500 mg by mouth daily.        [name] zonisamide (ZONEGRAN) 100 MG capsule Take 200mg   Twice daily       No current facility-administered medications for this visit.    ROS: A comprehensive review of systems was negative.  Exam:    BP 100/64  Ht 5' 4.25" (1.632 m)  Wt 123 lb (55.792 kg)  BMI 20.95 kg/m2  LMP 09/13/2000 Weight change: @WEIGHTCHANGE @ Last 3 height recordings:  Ht Readings from Last 3 Encounters:  09/06/12 5' 4.25" (1.632 m)  11/20/11 5' 4.5" (1.638 m)  11/06/10 5' 4.5" (1.638 m)   General appearance: alert, cooperative and appears stated age Head: Normocephalic, without obvious abnormality, atraumatic Neck: no adenopathy, supple, symmetrical, trachea midline and thyroid not enlarged, symmetric, no tenderness/mass/nodules Lungs: clear to auscultation bilaterally Breasts: normal appearance, no masses or tenderness,  Inspection negative, No nipple retraction or dimpling Heart: regular rate and rhythm Abdomen: soft, non-tender; bowel sounds normal; no masses,  no organomegaly Extremities: extremities normal, atraumatic, no cyanosis or edema Skin: Skin color, texture, turgor normal. No rashes or lesions Lymph nodes: Cervical, supraclavicular, and axillary nodes normal. no inguinal nodes palpated Neurologic: Alert and oriented X 3, normal strength and tone. Normal symmetric reflexes. Normal coordination and gait   Pelvic: External genitalia:  no lesions              Urethra: normal appearing urethra with no masses, tenderness or lesions              Bartholins and Skenes: normal, Bartholin's, Urethra, Skene's normal                 Vagina: normal appearing vagina with normal color and discharge, no lesions, atrophic              Cervix: normal appearance              Pap taken: yes        Bimanual Exam:  Uterus:  uterus is normal size, shape, consistency and nontender                                      Adnexa:    normal adnexa in size, nontender and no masses                                      Rectovaginal: Confirms                                      Anus:  normal sphincter tone, no lesions  A:1- well woman normal exam 2- Post Menopausal no HRT 3- Atrophic vaginitis with Premarin Cream use     P:1- Reviewed health and wellness pertinent to exam. 2-Aware of need to advise if any vaginal bleeding noted. 3-Rx Premarin 0.625mg  vaginal cream 1/2 gram per vagina 2 times weekly #30gm x 3 months supply x one year  With instructions and warning signs given.   RV one year or prn    An After Visit Summary was printed and given to the  patient.  Marland KitchenMarland KitchenMarland KitchenReviewed, TL

## 2012-09-13 NOTE — Addendum Note (Signed)
Addended by: Verner Chol on: 09/13/2012 11:58 AM   Modules accepted: Level of Service

## 2012-09-16 ENCOUNTER — Encounter: Payer: Self-pay | Admitting: Certified Nurse Midwife

## 2012-10-14 ENCOUNTER — Encounter: Payer: Self-pay | Admitting: Pulmonary Disease

## 2012-11-25 ENCOUNTER — Telehealth: Payer: Self-pay | Admitting: Pulmonary Disease

## 2012-11-25 ENCOUNTER — Ambulatory Visit (INDEPENDENT_AMBULATORY_CARE_PROVIDER_SITE_OTHER): Payer: Medicare Other | Admitting: Pulmonary Disease

## 2012-11-25 ENCOUNTER — Encounter: Payer: Self-pay | Admitting: Pulmonary Disease

## 2012-11-25 ENCOUNTER — Other Ambulatory Visit (INDEPENDENT_AMBULATORY_CARE_PROVIDER_SITE_OTHER): Payer: Medicare Other

## 2012-11-25 VITALS — BP 120/80 | HR 58 | Temp 98.1°F | Ht 64.5 in | Wt 121.8 lb

## 2012-11-25 DIAGNOSIS — M4120 Other idiopathic scoliosis, site unspecified: Secondary | ICD-10-CM

## 2012-11-25 DIAGNOSIS — E7800 Pure hypercholesterolemia, unspecified: Secondary | ICD-10-CM

## 2012-11-25 DIAGNOSIS — I059 Rheumatic mitral valve disease, unspecified: Secondary | ICD-10-CM

## 2012-11-25 DIAGNOSIS — G43909 Migraine, unspecified, not intractable, without status migrainosus: Secondary | ICD-10-CM

## 2012-11-25 DIAGNOSIS — I872 Venous insufficiency (chronic) (peripheral): Secondary | ICD-10-CM

## 2012-11-25 DIAGNOSIS — K5730 Diverticulosis of large intestine without perforation or abscess without bleeding: Secondary | ICD-10-CM

## 2012-11-25 DIAGNOSIS — R110 Nausea: Secondary | ICD-10-CM

## 2012-11-25 DIAGNOSIS — M25512 Pain in left shoulder: Secondary | ICD-10-CM

## 2012-11-25 DIAGNOSIS — M25519 Pain in unspecified shoulder: Secondary | ICD-10-CM

## 2012-11-25 DIAGNOSIS — R002 Palpitations: Secondary | ICD-10-CM

## 2012-11-25 DIAGNOSIS — K589 Irritable bowel syndrome without diarrhea: Secondary | ICD-10-CM

## 2012-11-25 LAB — HEPATIC FUNCTION PANEL
ALT: 15 U/L (ref 0–35)
AST: 16 U/L (ref 0–37)
Alkaline Phosphatase: 45 U/L (ref 39–117)
Bilirubin, Direct: 0.1 mg/dL (ref 0.0–0.3)
Total Bilirubin: 0.6 mg/dL (ref 0.3–1.2)

## 2012-11-25 LAB — CBC WITH DIFFERENTIAL/PLATELET
Basophils Relative: 0.6 % (ref 0.0–3.0)
Eosinophils Absolute: 0.1 10*3/uL (ref 0.0–0.7)
MCHC: 33.9 g/dL (ref 30.0–36.0)
MCV: 99 fl (ref 78.0–100.0)
Monocytes Absolute: 0.5 10*3/uL (ref 0.1–1.0)
Neutrophils Relative %: 76.2 % (ref 43.0–77.0)
Platelets: 179 10*3/uL (ref 150.0–400.0)

## 2012-11-25 LAB — LIPID PANEL
Total CHOL/HDL Ratio: 3
VLDL: 9.6 mg/dL (ref 0.0–40.0)

## 2012-11-25 LAB — LDL CHOLESTEROL, DIRECT: Direct LDL: 126.9 mg/dL

## 2012-11-25 LAB — TSH: TSH: 1.47 u[IU]/mL (ref 0.35–5.50)

## 2012-11-25 LAB — BASIC METABOLIC PANEL
Chloride: 108 mEq/L (ref 96–112)
Potassium: 4.6 mEq/L (ref 3.5–5.1)

## 2012-11-25 MED ORDER — PROMETHAZINE HCL 25 MG PO TABS: 12.5000 mg | ORAL_TABLET | Freq: Four times a day (QID) | ORAL | Status: DC | PRN

## 2012-11-25 MED ORDER — ATENOLOL 25 MG PO TABS: 25.0000 mg | ORAL_TABLET | Freq: Every day | ORAL | Status: DC

## 2012-11-25 MED ORDER — ONDANSETRON HCL 8 MG PO TABS: ORAL_TABLET | ORAL | Status: DC

## 2012-11-25 NOTE — Progress Notes (Addendum)
Subjective:    Patient ID: Dana Massey, female    DOB: 02/14/1946, 67 y.o.   MRN: [id number]  HPI 67 y/o WF here for a follow up visit...  she has multiple medical problems as noted below...    ~  [name]:  she retired from [employer] for [employer] and has been doing some consulting work for them... she has been doing well overall but has had several concerns-  noted temp difference in her legs w/ cool RLE- pulses OK, neg eval from [facility]- DrFields, w/ norm art dopplers and ABI's... this was felt to be a result of her prev back surg affecting her sympathetic nerves to her right leg...  now c/o some swelling in her left leg- no pain, erythema, etc- no long trips, prev DVT, etc... she states left leg always sl larger than right, "but this is more so"... we discussed VenDopplers (neg for DVT), no salt, elevation, TED hose.  ~  Nov 06, 2010:  Yearly ROV & CPX> doing well overall w/o new complaints or concerns;  CXR, EKG, Fasting labs> all look good today;  Continue same meds...  ~  November 20, 2011:  Yearly ROV> [name] has had a good yr other than her migraine HAs which have increased recently; she is followed by DrFreeman's [facility] & he has adjusted her Zonegran up to 200mg Bid & she continues on the [facility]; last year her Optometrist, [name], found a cotton wool spot in her fundus & called requesting Carotid dopplers which were performed 6/12 & WNL- no plaque, normal flow...    We reviewed prob list, meds, xrays and labs> see below>>  PNEUMOVAX given today... LABS 6/13:  FLP- at goals on diet;  Chems- wnl;  CBC- wnl;  TSH=1.54;  VitD=65;  UA- clear...   ~  November 25, 2012:  Yearly ROV & [place] reports that she is having some nausea & decr appetite x 1-2d- no abd pain, dysphagia, vomiting, d/c, blood seen; she has hx Divertics & IBS, last colon 2009 by [name] was otherw neg; she doubts that this was due to something that she ate- Rec to check labs, AbdSonar, & Rx w/ Phenergan prn...  She also  reports an ER visit 2/14 to [name] w/ SOB (says O2sat was 88%), itchy palms & red face; she was given NEBS x3 & was told EKG was fine; then given IV Benedryl & Solumed=> she became flushed, hot, & given Epi & watched overnight- everything resolved, no recurrence, never went for allergy testing, & carried an Epipen just in case... We reviewed the following medical problems during today's office visit >>     Glaucoma> on eye drops from her Optometrist, [name], she reports that pressures are under good control...    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    MVP, Palpit> on ASA81 & Aten25; BP=120/80 & she denies CP, SOB/DOE, edema, etc; notes occas palpit improved w/ BBlocker/ no caffeine/ etc; she states that she is not able to exercise & gets lightheaded w/ min exercise on a machine; 2DEcho 2007 was normal (no MVP seen)- offered Cards consult w/ exercise study but she declined...     Ven Insuffic> she knows to elim sodium, elev legs, wear support hose when able...    CHOL> see below, labs 6/14 showed LDL=127; she is not inclined to take meds, therefore rec low chol/ low fat diet...    GI- Divertics, IBS> SEE ABOVE  GYN> she sees [name] at [employer] on Premarin vag cream; seen 3/14 7 note reviewed-     Ortho- s/p scoliosis surg 1994, s/p right rotator cuff surg> Scoliosis surg was 1994 at [facility] center & it affected the sympathetic nerves in right leg w/ coolness & pain intermittently; sees DrBarnes at [facility] for Ortho- given shot in left shoulder 1/14...     Osteopenia> she was on Fosamax until 2011 & stopped for drug holiday; takes calcium, MVI, VitD; BMDs at [facility] & we don't have most recent report...    Migraines> on Frova2.5 & Zonegran100-2Bid; followed by DrFreeman & stable by her report... We reviewed prob list, meds, xrays and labs> see below for updates >>  LABS 6/14:  FLP- not at goals on diet, LDL=127;  Chems- ok x BS=106, LFTs are  wnl;  CBC- wnl;  TSH=1.47... Abd Ultrasound 6/14> normal/ neg (if symptoms persist we will refer to GI)...           Problem List:  GLAUCOMA (ICD-365.9) - on 3 eye drops per Optometrist, [name] who reports hx Glaucoma, dry eyes, cataracts... ~  S/p left cataract surg by [facility] 11/11... ~  [name] saw a cotton wool exud in left fundus; CDoppler's at his request 6/12 were WNL- no plaque, normal flows.  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  MITRAL VALVE PROLAPSE (ICD-424.0) - on ASA 81mg /d... clinical Dx w/ hx of CP & palpit in the past, but none recently... intermittent click/ murmur heard...  ~  2DEcho 3/07 was neg without MVP seen, plus norm wall motion and EF=55-60%... ~  Baseline EKG shows sinus brady, otherw WNL.Marland KitchenMarland Kitchen  PALPITATIONS (ICD-785.1) - none recently- on ATENOLOL 25mg /d... avoids caffeine, etc...  VENOUS INSUFFICIENCY (ICD-459.81) - she states left leg always sl > right leg... eval 4/10 w/ VenDopplers = neg for DVT... TED hose helps... ~  2/10 had right leg pain & coolness- eval by [facility] w/ normal ABI's & symptoms felt to be secondary to prev back surgery and sympathetic nerves to right leg... ~  5/12: she notes some left leg edema> advised no salt, elevation, TED when able...  DIVERTICULOSIS OF COLON (ICD-562.10) >>  Hx of IRRITABLE BOWEL SYNDROME (ICD-564.1) >> doing well x mild constipation- uses Miralax & Probiotic as needed ~  Colonoscopy 1/01 by [name] showed divertics only... ~  f/u colonoscopy 5/09 by [name] showed divertics, no polyps... f/u rec in 54yrs. ~  6/14: c/o nausea & decr appetite x 1-2d- no abd pain, dysphagia, vomiting, d/c, blood seen; she has hx divertics & IBS, last colon 2009 by [name] was otherw neg; she doubts that this was due to something that she ate- Rec to check labs, AbdSonar, & Rx w/ Phenergan prn  Hx of SHOULDER PAIN (ICD-719.41) - s/p right  rotator cuff surg at [facility]... She sees DrBarnes, Ortho at [place]- he gave her a shot in left shoulder 1/14...  Hx of SCOLIOSIS (ICD-737.30) - s/p surgery at the [facility] in 1994 w rods etc to correct the scoliosis... this surg apparently affected the sympathetic nerves to her right leg w/ coolness & pain intermittently...  OSTEOPENIA (ICD-733.90) - BMD followed at [facility] on Calcium, vits, vit D OTC per GYN... she stopped Fosamax on her own 2011 after ~37yrs Rx for a drug holiday... ~  BMD 09/16/07 showed Osteopenia w/ TScores --1.1 to -1.9.Marland KitchenMarland Kitchen ~  BMD  09/17/09 showed TScores -2.1 left FemNeck, & -1.1 left total forearm... ~  BMD 4/13 >> we do not have this report... ~  Labs 6/13 showed VitD level = 65  Hx of MIGRAINE HEADACHE (ICD-346.90) - extensive eval and f/u by DrFreeman... on ATENOLOL 25mg /d for prevention, on ZONEGRAN 200mg Bid for prevention, uses FROVA 2.5mg  for Rx... ~  6/13: recent incr in migraine frequency has her concerned & we discussed options; DrFreeman increased her Zonegran dose... ~  6/14:  on Frova2.5 & Zonegran100-2Bid; followed by DrFreeman & stable by her report.  HEALTH MAINTENANCE: ~  GI> [name], last colon 5/09 w/ divertics, no polyps, f/u 43yrs. ~  GYN> DrSuzMiller who does PAP, Mammogram, and checked her Vit D level - Rx w/ Vit D 50000 u weekly for awhile, now on OTC Rx. ~  Immuniz> she gets the seasonal Flu vaccine yearly;  PNEUMOVAX given 6/13 (age 20);  She had Tetanus shot ?2011;  We discussed Shingles vaccine.   Past Surgical History  Procedure Laterality Date  . Back surgery  1994    scoliosis at [facility]  . Rotator cuff repair    . Cataract extraction  11/11    left    Outpatient Encounter Prescriptions as of 11/25/2012  Medication Sig Dispense Refill  . aspirin 81 MG tablet Take 81 mg by mouth daily.        [name] atenolol (TENORMIN) 25 MG tablet Take 1 tablet (25 mg total) by mouth daily.  90 tablet  0  . brinzolamide (AZOPT) 1 %  ophthalmic suspension Place 1 drop into both eyes 2 (two) times daily.       . Calcium-Vitamin D-Vitamin K (VIACTIV) 500-100-40 MG-UNT-MCG CHEW Chew 1 capsule by mouth daily.        [name] conjugated estrogens (PREMARIN) vaginal cream Place vaginally 2 (two) times a week. 1/2 gram 2 times per week per vagina  42.5 g  4  . cycloSPORINE (RESTASIS) 0.05 % ophthalmic emulsion Place 1 drop into both eyes 2 (two) times daily.        . fish oil-omega-3 fatty acids 1000 MG capsule Take 2 g by mouth daily.        . frovatriptan (FROVA) 2.5 MG tablet Take 2.5 mg by mouth as needed. If recurs, may repeat after 2 hours. Max of 3 tabs in 24 hours.       [name] MELATONIN PO Take 6 mg by mouth.      . Multiple Vitamins-Minerals (MULTIVITAMIN WITH MINERALS) tablet Take 1 tablet by mouth daily.        . polyethylene glycol powder (MIRALAX) powder Take 17 g by mouth daily as needed.       . travoprost, benzalkonium, (TRAVATAN) 0.004 % ophthalmic solution Place 1 drop into both eyes at bedtime.        . vitamin C (ASCORBIC ACID) 500 MG tablet Take 500 mg by mouth daily.        [name] zonisamide (ZONEGRAN) 100 MG capsule Take 200mg   Twice daily       No facility-administered encounter medications on file as of 11/25/2012.    No Known Allergies   Review of Systems        The patient notes increased freq of her migraine HAs; The patient denies fever, chills, sweats, anorexia, fatigue, weakness, malaise, weight loss, sleep disorder, blurring, diplopia, eye irritation, eye discharge, vision loss, eye pain, photophobia, earache, ear discharge, tinnitus, decreased hearing, nasal congestion, nosebleeds, sore throat, hoarseness, chest pain, palpitations,  syncope, dyspnea on exertion, orthopnea, PND, peripheral edema, cough, dyspnea at rest, excessive sputum, hemoptysis, wheezing, pleurisy, nausea, vomiting, diarrhea, constipation, change in bowel habits, abdominal pain, melena, hematochezia, jaundice, gas/bloating, indigestion/heartburn,  dysphagia, odynophagia, dysuria, hematuria, urinary frequency, urinary hesitancy, nocturia, incontinence, joint pain, joint swelling, back pain, muscle cramps, muscle weakness, arthritis, sciatica, restless legs, leg pain at night, leg pain with exertion, rash, itching, dryness, suspicious lesions, paralysis, paresthesias, seizures, tremors, vertigo, transient blindness, frequent falls, difficulty walking, depression, anxiety, memory loss, confusion, cold intolerance, heat intolerance, polydipsia, polyphagia, polyuria, unusual weight change, abnormal bruising, bleeding, enlarged lymph nodes, urticaria, allergic rash, hay fever, and recurrent infections.     Objective:   Physical Exam     WD, WN, 67 y/o WF in NAD... Massey:  Alert & oriented; pleasant & cooperative... HEENT:  /AT, EOM-full, PERRLA, Fundi- not vis, EACs-clear, TMs-wnl, NOSE-clear, THROAT-clear & wnl. NECK:  Supple w/ fairROM; no JVD; normal carotid impulses w/o bruits; no thyromegaly or nodules palpated; no lymphadenopathy. CHEST:  Clear to P & A; without wheezes/ rales/ or rhonchi heard; she has a left thoracotomy scan related to her back surg. HEART:  Regular Rhythm; without murmurs/ rubs/ or gallops detected... ABDOMEN:  Soft & nontender; normal bowel sounds; no organomegaly or masses palpated... BACK:  s/p scoliosis surg w/ rods etc; long midline back scar... EXT: without deformities, mild arthritic changes; no varicose veins/ +venous insuffic on left>right, neg homan's etc. NEURO:  CN's intact; motor testing normal; sensory testing normal; gait normal & balance OK. DERM:  no lesions noted...  RADIOLOGY DATA:  Reviewed in the EPIC EMR & discussed w/ the patient...  LABORATORY DATA:  Reviewed in the EPIC EMR & discussed w/ the patient...   Assessment & Plan:    Cardiac> Hx ?MVP, palpit> stable on Atenolol 25mg /d, off caffeine etc...  Ven Insuffic>  She knows to avoid salt, elev legs, wear support hose prn...  GI>   6/14 presents w/ new onset nausea & decr appetite- Rx w/ phenergan prn & checking labs, sonar- pending...  Scoliosis> s/p extensive surg 30 & doing satis overall & she currently denies LBP...  Osteopenia>  BMD followed by GYN & she is currently off Fosamax for drug holiday...  Migraines>  Eval from [facility], on Aten/ Zonegran/ Frova and under better control w/ his adjustments...   Patient's Medications  New Prescriptions   PROMETHAZINE (PHENERGAN) 25 MG TABLET    Take 0.5-1 tablets (12.5-25 mg total) by mouth every 6 (six) hours as needed for nausea.  Previous Medications   ASPIRIN 81 MG TABLET    Take 81 mg by mouth daily.     BRINZOLAMIDE (AZOPT) 1 % OPHTHALMIC SUSPENSION    Place 1 drop into both eyes 2 (two) times daily.    CALCIUM-VITAMIN D-VITAMIN K (VIACTIV) 500-100-40 MG-UNT-MCG CHEW    Chew 1 capsule by mouth daily.     CONJUGATED ESTROGENS (PREMARIN) VAGINAL CREAM    Place vaginally 2 (two) times a week. 1/2 gram 2 times per week per vagina   CYCLOSPORINE (RESTASIS) 0.05 % OPHTHALMIC EMULSION    Place 1 drop into both eyes 2 (two) times daily.     FISH OIL-OMEGA-3 FATTY ACIDS 1000 MG CAPSULE    Take 2 g by mouth daily.     FROVATRIPTAN (FROVA) 2.5 MG TABLET    Take 2.5 mg by mouth as needed. If recurs, may repeat after 2 hours. Max of 3 tabs in 24 hours.    MELATONIN PO  Take 6 mg by mouth.   MULTIPLE VITAMINS-MINERALS (MULTIVITAMIN WITH MINERALS) TABLET    Take 1 tablet by mouth daily.     POLYETHYLENE GLYCOL POWDER (MIRALAX) POWDER    Take 17 g by mouth daily as needed.    TRAVOPROST, BENZALKONIUM, (TRAVATAN) 0.004 % OPHTHALMIC SOLUTION    Place 1 drop into both eyes at bedtime.     VITAMIN C (ASCORBIC ACID) 500 MG TABLET    Take 500 mg by mouth daily.     ZONISAMIDE (ZONEGRAN) 100 MG CAPSULE    Take 200mg   Twice daily  Modified Medications   Modified Medication Previous Medication   ATENOLOL (TENORMIN) 25 MG TABLET atenolol (TENORMIN) 25 MG tablet      Take 1  tablet (25 mg total) by mouth daily.    Take 1 tablet (25 mg total) by mouth daily.  Discontinued Medications   ONDANSETRON (ZOFRAN) 8 MG TABLET    Take 1/2 to 1 tablet by mouth every 6 hours as needed for nausea

## 2012-11-25 NOTE — Patient Instructions (Addendum)
Today we updated your med list in our EPIC system...    Continue your current medications the same...  We refilled the meds you requested and wrote a new prescription for [facility] 8mg  tabs- 1/2 to 1 tab by mouth up to every 6h as needed for the nausea...  Today we did your follow up FASTING blood work... We will do further screening w/ an ABDOMINAL ULTRASOUND test...    We will contact you w/ the results when available...   Call for any questions...  Let's plan a follow up visit in 50yr, sooner if needed for problems.Marland KitchenMarland Kitchen

## 2012-11-25 NOTE — Telephone Encounter (Signed)
Per SN---  Ok to change to phenergan 25 mg  #20  1/2 to 1 tablet by mouth every 6 hours as needed for nausea and give 4 refills.

## 2012-11-25 NOTE — Telephone Encounter (Signed)
I spoke with julie. She stated the zofran is going to cost pt $91.50 bc it is not covered not even generic. She ran promethazine 25 mg #20 tabs and it is covered. Please advise SN thanks

## 2012-11-25 NOTE — Telephone Encounter (Signed)
New Rx has been called in.

## 2012-11-28 ENCOUNTER — Ambulatory Visit (HOSPITAL_COMMUNITY)
Admission: RE | Admit: 2012-11-28 | Discharge: 2012-11-28 | Disposition: A | Discharge status: Home / Self Care | Payer: Medicare Other | Source: Ambulatory Visit | Attending: Pulmonary Disease | Admitting: Pulmonary Disease

## 2012-11-28 ENCOUNTER — Encounter: Payer: Self-pay | Admitting: *Deleted

## 2012-11-28 DIAGNOSIS — R110 Nausea: Secondary | ICD-10-CM | POA: Insufficient documentation

## 2012-11-28 DIAGNOSIS — R630 Anorexia: Secondary | ICD-10-CM | POA: Insufficient documentation

## 2012-11-28 NOTE — Progress Notes (Signed)
Quick Note:  Pt notified per [employer]. ______

## 2013-03-21 ENCOUNTER — Ambulatory Visit (INDEPENDENT_AMBULATORY_CARE_PROVIDER_SITE_OTHER): Payer: Medicare Other

## 2013-03-21 DIAGNOSIS — Z23 Encounter for immunization: Secondary | ICD-10-CM

## 2013-04-27 ENCOUNTER — Telehealth: Payer: Self-pay | Admitting: Pulmonary Disease

## 2013-04-27 NOTE — Telephone Encounter (Signed)
Spoke with the pt  She states that for the past wk or so, has noticed some "fluttering" in her chest  She denies any CP, numbness, nausea or other co's  OV with SN tomorrow for eval  Seek emergent care if needed

## 2013-04-28 ENCOUNTER — Ambulatory Visit (INDEPENDENT_AMBULATORY_CARE_PROVIDER_SITE_OTHER): Payer: Medicare Other | Admitting: Pulmonary Disease

## 2013-04-28 ENCOUNTER — Encounter: Payer: Self-pay | Admitting: Pulmonary Disease

## 2013-04-28 VITALS — BP 128/74 | HR 63 | Temp 97.6°F | Ht 64.0 in | Wt 124.2 lb

## 2013-04-28 DIAGNOSIS — I872 Venous insufficiency (chronic) (peripheral): Secondary | ICD-10-CM

## 2013-04-28 DIAGNOSIS — M899 Disorder of bone, unspecified: Secondary | ICD-10-CM

## 2013-04-28 DIAGNOSIS — R002 Palpitations: Secondary | ICD-10-CM

## 2013-04-28 DIAGNOSIS — M4120 Other idiopathic scoliosis, site unspecified: Secondary | ICD-10-CM

## 2013-04-28 DIAGNOSIS — G43909 Migraine, unspecified, not intractable, without status migrainosus: Secondary | ICD-10-CM

## 2013-04-28 DIAGNOSIS — K589 Irritable bowel syndrome without diarrhea: Secondary | ICD-10-CM

## 2013-04-28 DIAGNOSIS — K5730 Diverticulosis of large intestine without perforation or abscess without bleeding: Secondary | ICD-10-CM

## 2013-04-28 DIAGNOSIS — H409 Unspecified glaucoma: Secondary | ICD-10-CM

## 2013-04-28 NOTE — Progress Notes (Signed)
Subjective:    Patient ID: Dana Massey, female    DOB: November 26, 1945, 67 y.o.   MRN: [id number]  HPI 67 y/o WF here for a follow up visit...  she has multiple medical problems as noted below...    ~  Nov 06, 2010:  Yearly ROV & CPX> doing well overall w/o new complaints or concerns;  CXR, EKG, Fasting labs> all look good today;  Continue same meds...  ~  November 20, 2011:  Yearly ROV> Dana Massey has had a good yr other than her migraine HAs which have increased recently; she is followed by [facility]Freeman[facility] & he has adjusted her Zonegran up to 200mg Bid & she continues on the [facility]; last year her Optometrist, [name], found a cotton wool spot in her fundus & called requesting Carotid dopplers which were performed 6/12 & WNL- no plaque, normal flow...    We reviewed prob list, meds, xrays and labs> see below>>  PNEUMOVAX given today... LABS 6/13:  FLP- at goals on diet;  Chems- wnl;  CBC- wnl;  TSH=1.54;  VitD=65;  UA- clear...   ~  November 25, 2012:  Yearly ROV & Dana Massey reports that she is having some nausea & decr appetite x 1-2d- no abd pain, dysphagia, vomiting, d/c, blood seen; she has hx Divertics & IBS, last colon 2009 by [name] was otherw neg; she doubts that this was due to something that she ate- Rec to check labs, AbdSonar, & Rx w/ Phenergan prn...  She also reports an ER visit 2/14 to [facility] w/ SOB (says O2sat was 88%), itchy palms & red face; she was given NEBS x3 & was told EKG was fine; then given IV Benedryl & Solumed=> she became flushed, hot, & given Epi & watched overnight- everything resolved, no recurrence, never went for allergy testing, & carried an Epipen just in case... We reviewed the following medical problems during today's office visit >>     Glaucoma> on eye drops from her Optometrist, [name], she reports that pressures are under good control...    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    MVP, Palpit> on ASA81 & Aten25;  BP=120/80 & she denies CP, SOB/DOE, edema, etc; notes occas palpit improved w/ BBlocker/ no caffeine/ etc; she states that she is not able to exercise & gets lightheaded w/ min exercise on a machine; 2DEcho 2007 was normal (no MVP seen)- offered Cards consult w/ exercise study but she declined...     Ven Insuffic> she knows to elim sodium, elev legs, wear support hose when able...    CHOL> see below, labs 6/14 showed LDL=127; she is not inclined to take meds, therefore rec low chol/ low fat diet...    GI- Divertics, IBS> SEE ABOVE     GYN> she sees [name] at [employer] on Premarin vag cream; seen 3/14 7 note reviewed-     Ortho- s/p scoliosis surg 1994, s/p right rotator cuff surg> Scoliosis surg was 1994 at [facility] center & it affected the sympathetic nerves in right leg w/ coolness & pain intermittently; sees DrBarnes at [place] for Ortho- given shot in left shoulder 1/14...     Osteopenia> she was on Fosamax until 2011 & stopped for drug holiday; takes calcium, MVI, VitD; BMDs at [facility] & we don't have most recent report...    Migraines> on Frova2.5 & Zonegran100-2Bid; followed by DrFreeman & stable by her report... We reviewed prob list, meds, xrays and labs> see  below for updates >>  LABS 6/14:  FLP- not at goals on diet, LDL=127;  Chems- ok x BS=106, LFTs are wnl;  CBC- wnl;  TSH=1.47... Abd Ultrasound 6/14> normal/ neg (if symptoms persist we will refer to GI)...  ~  April 28, 2013:  266mo ROV & add-on appt for palpit> Dana Massey c/o 5d hx of "pounding" "real hard beats" which she calls "a fluttering" but by description sounds like PVCs w/ compensatory pause & subsequent hard beat; she has had similar symptoms infeq for yrs- treated w/ Aten 25 & she avoids caffeine etc; 5d hx increased symptoms, mult times per day, denies CP but sl dizzy no syncope;  Baseline EKG is SBrady/ wnl- EKG today is SBrady, rate58, NAD;  Prev 2DEcho 2007 was wnl, no MVP seen;  We discussed further eval w/ repeat  2Decho and Holter monitor to catch the arrhythmia vs Cardiology consult for same & she prefers the latter=> we will set up Cards consult... In the interim she can take an extra 1/2 of the 25mg  atenolol if needed...     We reviewed prob list, meds, xrays and labs> see below for updates >>  Labs 6/14 reviewed=> Chems- normal & TSH was wnl at 1.47           Problem List:  GLAUCOMA (ICD-365.9) - on 3 eye drops per Optometrist, [name] who reports hx Glaucoma, dry eyes, cataracts... ~  S/p left cataract surg by [facility] 11/11... ~  [name] saw a cotton wool exud in left fundus; CDoppler's at his request 6/12 were WNL- no plaque, normal flows.  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  ?? MITRAL VALVE PROLAPSE (ICD-424.0) - on ASA 81mg /d... clinical Dx w/ hx of CP & palpit in the past, & intermittent click/ murmur heard...  PALPITATIONS (ICD-785.1) - on ATENOLOL 25mg /d... avoids caffeine, etc... ~  2DEcho 3/07 was neg without MVP seen, plus norm wall motion and EF=55-60%... ~  Baseline EKG shows sinus brady, otherw WNL... ~  11/14: add-on appt for palpit> Dana Massey c/o 5d hx of "pounding" "real hard beats" which she calls "a fluttering" but by description sounds like PVCs w/ compensatory pause & subsequent hard beat; she has had similar symptoms infeq for yrs- treated w/ Aten 25 & she avoids caffeine etc; 5d hx increased symptoms, mult times per day, denies CP but sl dizzy no syncope => referred to Cards for further eval.  VENOUS INSUFFICIENCY (ICD-459.81) - she states left leg always sl > right leg... eval 4/10 w/ VenDopplers = neg for DVT... TED hose helps... ~  2/10 had right leg pain & coolness- eval by [facility] w/ normal ABI's & symptoms felt to be secondary to prev back surgery and sympathetic nerves to right leg... ~  5/12: she notes some left leg edema> advised no salt, elevation, TED when  able...  DIVERTICULOSIS OF COLON (ICD-562.10) >>  Hx of IRRITABLE BOWEL SYNDROME (ICD-564.1) >> doing well x mild constipation- uses Miralax & Probiotic as needed ~  Colonoscopy 1/01 by [name] showed divertics only... ~  f/u colonoscopy 5/09 by [name] showed divertics, no polyps... f/u rec in 3yrs. ~  6/14: c/o nausea & decr appetite x 1-2d- no abd pain, dysphagia, vomiting, d/c, blood seen; she has hx divertics & IBS, last colon 2009 by [name] was otherw neg; she doubts that this was due to something that she ate- Rec to  check labs, AbdSonar, & Rx w/ Phenergan prn  Hx of SHOULDER PAIN (ICD-719.41) - s/p right rotator cuff surg at [facility]... She sees DrBarnes, Ortho at [place]- he gave her a shot in left shoulder 1/14...  Hx of SCOLIOSIS (ICD-737.30) - s/p surgery at the [facility] in 1994 w rods etc to correct the scoliosis... this surg apparently affected the sympathetic nerves to her right leg w/ coolness & pain intermittently...  OSTEOPENIA (ICD-733.90) - BMD followed at [facility] on Calcium, vits, vit D OTC per GYN... she stopped Fosamax on her own 2011 after ~26yrs Rx for a drug holiday... ~  BMD 09/16/07 showed Osteopenia w/ TScores --1.1 to -1.9.Marland KitchenMarland Kitchen ~  BMD 09/17/09 showed TScores -2.1 left FemNeck, & -1.1 left total forearm... ~  BMD 4/13 >> we do not have this report... ~  Labs 6/13 showed VitD level = 65  Hx of MIGRAINE HEADACHE (ICD-346.90) - extensive eval and f/u by DrFreeman... on ATENOLOL 25mg /d for prevention, on ZONEGRAN 200mg Bid for prevention, uses FROVA 2.5mg  for Rx... ~  6/13: recent incr in migraine frequency has her concerned & we discussed options; DrFreeman increased her Zonegran dose... ~  6/14:  on Frova2.5 & Zonegran100-2Bid; followed by DrFreeman & stable by her report.  HEALTH MAINTENANCE: ~  GI> [name], last colon 5/09 w/ divertics, no polyps, f/u 86yrs. ~  GYN> DrSuzMiller who does PAP, Mammogram, and checked her Vit D level - Rx w/ Vit D 50000 u  weekly for awhile, now on OTC Rx. ~  Immuniz> she gets the seasonal Flu vaccine yearly;  PNEUMOVAX given 6/13 (age 29);  She had Tetanus shot ?2011;  We discussed Shingles vaccine.   Past Surgical History  Procedure Laterality Date  . Back surgery  1994    scoliosis at [facility] spine center  . Rotator cuff repair    . Cataract extraction  11/11    left    Outpatient Encounter Prescriptions as of 04/28/2013  Medication Sig  . aspirin 81 MG tablet Take 81 mg by mouth daily.    [name] atenolol (TENORMIN) 25 MG tablet Take 1 tablet (25 mg total) by mouth daily.  . brinzolamide (AZOPT) 1 % ophthalmic suspension Place 1 drop into both eyes 2 (two) times daily.   . Calcium-Vitamin D-Vitamin K (VIACTIV) 500-100-40 MG-UNT-MCG CHEW Chew 1 capsule by mouth daily.    [name] conjugated estrogens (PREMARIN) vaginal cream Place vaginally 2 (two) times a week. 1/2 gram 2 times per week per vagina  . cycloSPORINE (RESTASIS) 0.05 % ophthalmic emulsion Place 1 drop into both eyes 2 (two) times daily.    . fish oil-omega-3 fatty acids 1000 MG capsule Take 2 g by mouth daily.    . frovatriptan (FROVA) 2.5 MG tablet Take 2.5 mg by mouth as needed. If recurs, may repeat after 2 hours. Max of 3 tabs in 24 hours.   [name] MELATONIN PO Take 6 mg by mouth.  . Multiple Vitamins-Minerals (MULTIVITAMIN WITH MINERALS) tablet Take 1 tablet by mouth daily.    . polyethylene glycol powder (MIRALAX) powder Take 17 g by mouth daily as needed.   . travoprost, benzalkonium, (TRAVATAN) 0.004 % ophthalmic solution Place 1 drop into both eyes at bedtime.    . vitamin C (ASCORBIC ACID) 500 MG tablet Take 500 mg by mouth daily.    [name] zonisamide (ZONEGRAN) 100 MG capsule Take 200mg   Twice daily  . [DISCONTINUED] promethazine (PHENERGAN) 25 MG tablet Take 0.5-1 tablets (12.5-25 mg total) by mouth every 6 (  six) hours as needed for nausea.    No Known Allergies   Review of Systems        The patient notes increased freq of her migraine HAs; The  patient denies fever, chills, sweats, anorexia, fatigue, weakness, malaise, weight loss, sleep disorder, blurring, diplopia, eye irritation, eye discharge, vision loss, eye pain, photophobia, earache, ear discharge, tinnitus, decreased hearing, nasal congestion, nosebleeds, sore throat, hoarseness, chest pain, palpitations, syncope, dyspnea on exertion, orthopnea, PND, peripheral edema, cough, dyspnea at rest, excessive sputum, hemoptysis, wheezing, pleurisy, nausea, vomiting, diarrhea, constipation, change in bowel habits, abdominal pain, melena, hematochezia, jaundice, gas/bloating, indigestion/heartburn, dysphagia, odynophagia, dysuria, hematuria, urinary frequency, urinary hesitancy, nocturia, incontinence, joint pain, joint swelling, back pain, muscle cramps, muscle weakness, arthritis, sciatica, restless legs, leg pain at night, leg pain with exertion, rash, itching, dryness, suspicious lesions, paralysis, paresthesias, seizures, tremors, vertigo, transient blindness, frequent falls, difficulty walking, depression, anxiety, memory loss, confusion, cold intolerance, heat intolerance, polydipsia, polyphagia, polyuria, unusual weight change, abnormal bruising, bleeding, enlarged lymph nodes, urticaria, allergic rash, hay fever, and recurrent infections.     Objective:   Physical Exam     WD, WN, 67 y/o WF in NAD, appears sl anxious... GENERAL:  Alert & oriented; pleasant & cooperative... HEENT:  /AT, EOM-full, PERRLA, Fundi- not vis, EACs-clear, TMs-wnl, NOSE-clear, THROAT-clear & wnl. NECK:  Supple w/ fairROM; no JVD; normal carotid impulses w/o bruits; no thyromegaly or nodules palpated; no lymphadenopathy. CHEST:  Clear to P & A; without wheezes/ rales/ or rhonchi heard; she has a left thoracotomy scar related to her back surg. HEART:  Regular Rhythm; without murmurs/ rubs/ or gallops detected... ABDOMEN:  Soft & nontender; normal bowel sounds; no organomegaly or masses palpated... BACK:  s/p  scoliosis surg w/ rods etc; long midline back scar... EXT: without deformities, mild arthritic changes; no varicose veins/ +venous insuffic on left>right, neg homan's etc. NEURO:  CN's intact; motor testing normal; sensory testing normal; gait normal & balance OK. DERM:  no lesions noted...  RADIOLOGY DATA:  Reviewed in the EPIC EMR & discussed w/ the patient...  LABORATORY DATA:  Reviewed in the EPIC EMR & discussed w/ the patient...   Assessment & Plan:    Cardiac> Hx ?MVP, +palpit> prev stable on Atenolol 25mg /d, off caffeine etc but w/ 5d hx increased palpit- needs repeat 2DEcho & Holter- requests refer to Cards...  Ven Insuffic>  She knows to avoid salt, elev legs, wear support hose prn...  GI>  6/14 presents w/ new onset nausea & decr appetite- Rx w/ phenergan prn & we checked labs/ sonar- neg...  Scoliosis> s/p extensive surg 81 & doing satis overall & she currently denies LBP...  Osteopenia>  BMD followed by GYN & she is currently off Fosamax for drug holiday...  Migraines>  Eval from [facility], on Aten/ Zonegran/ Frova and under better control w/ his adjustments...   Patient's Medications  New Prescriptions   No medications on file  Previous Medications   ASPIRIN 81 MG TABLET    Take 81 mg by mouth daily.     ATENOLOL (TENORMIN) 25 MG TABLET    Take 1 tablet (25 mg total) by mouth daily.   BRINZOLAMIDE (AZOPT) 1 % OPHTHALMIC SUSPENSION    Place 1 drop into both eyes 2 (two) times daily.    CALCIUM-VITAMIN D-VITAMIN K (VIACTIV) 500-100-40 MG-UNT-MCG CHEW    Chew 1 capsule by mouth daily.     CONJUGATED ESTROGENS (PREMARIN) VAGINAL CREAM    Place vaginally 2 (  two) times a week. 1/2 gram 2 times per week per vagina   CYCLOSPORINE (RESTASIS) 0.05 % OPHTHALMIC EMULSION    Place 1 drop into both eyes 2 (two) times daily.     FISH OIL-OMEGA-3 FATTY ACIDS 1000 MG CAPSULE    Take 2 g by mouth daily.     FROVATRIPTAN (FROVA) 2.5 MG TABLET    Take 2.5 mg by mouth as  needed. If recurs, may repeat after 2 hours. Max of 3 tabs in 24 hours.    MELATONIN PO    Take 6 mg by mouth.   MULTIPLE VITAMINS-MINERALS (MULTIVITAMIN WITH MINERALS) TABLET    Take 1 tablet by mouth daily.     POLYETHYLENE GLYCOL POWDER (MIRALAX) POWDER    Take 17 g by mouth daily as needed.    TRAVOPROST, BENZALKONIUM, (TRAVATAN) 0.004 % OPHTHALMIC SOLUTION    Place 1 drop into both eyes at bedtime.     VITAMIN C (ASCORBIC ACID) 500 MG TABLET    Take 500 mg by mouth daily.     ZONISAMIDE (ZONEGRAN) 100 MG CAPSULE    Take 200mg   Twice daily  Modified Medications   No medications on file  Discontinued Medications   PROMETHAZINE (PHENERGAN) 25 MG TABLET    Take 0.5-1 tablets (12.5-25 mg total) by mouth every 6 (six) hours as needed for nausea.

## 2013-04-28 NOTE — Patient Instructions (Signed)
Today we updated your med list in our EPIC system...    Continue your current medications the same...  We decided to refer you to our Cardiac electrophysiologists for further eval of your palpitations/ irregularity...    They will likely want to repeat your heart Echocardiogram & schedule a heart monitor test to "catch" the arrhythmia...    In the meanwhile, you may take an extra 1/2 Atenolol in the second half of the day if the skipping is persistent...  Call for any questions or if we can be of service in any way.Marland KitchenMarland Kitchen

## 2013-05-19 ENCOUNTER — Encounter: Payer: Self-pay | Admitting: Radiology

## 2013-05-19 ENCOUNTER — Ambulatory Visit (INDEPENDENT_AMBULATORY_CARE_PROVIDER_SITE_OTHER): Payer: Medicare Other | Admitting: Internal Medicine

## 2013-05-19 ENCOUNTER — Encounter (INDEPENDENT_AMBULATORY_CARE_PROVIDER_SITE_OTHER): Payer: Medicare Other

## 2013-05-19 ENCOUNTER — Encounter: Payer: Self-pay | Admitting: Internal Medicine

## 2013-05-19 VITALS — BP 137/87 | HR 90 | Ht 64.0 in | Wt 125.0 lb

## 2013-05-19 DIAGNOSIS — R002 Palpitations: Secondary | ICD-10-CM

## 2013-05-19 NOTE — Progress Notes (Signed)
HPI Dana Massey is referred today by Dr. Kriste Basque for evaluation of palpitations. The patient is a very pleasant 67 year old woman who's had palpitations for 20 years. She has not had syncope. She notes that her palpitations have increased in frequency and severity. She states that she were cardiac monitor approximately 20 years ago and was told she had mitral valve prolapse. 2-D echoes in the recent past have not supported this diagnosis by her report. The patient does have migraine headaches. Allergies  Allergen Reactions  . Wasp Venom Anaphylaxis    Has EpiPen     Current Outpatient Prescriptions  Medication Sig Dispense Refill  . aspirin 81 MG tablet Take 81 mg by mouth daily.        [name] atenolol (TENORMIN) 25 MG tablet Take 1 tablet (25 mg total) by mouth daily.  90 tablet  3  . brinzolamide (AZOPT) 1 % ophthalmic suspension Place 1 drop into both eyes 2 (two) times daily.       . Calcium-Vitamin D-Vitamin K (VIACTIV) 500-100-40 MG-UNT-MCG CHEW Chew 1 capsule by mouth daily.        [name] conjugated estrogens (PREMARIN) vaginal cream Place vaginally 2 (two) times a week. 1/2 gram 2 times per week per vagina  42.5 g  4  . cycloSPORINE (RESTASIS) 0.05 % ophthalmic emulsion Place 1 drop into both eyes 2 (two) times daily.        . fish oil-omega-3 fatty acids 1000 MG capsule Take 2 g by mouth daily.        . frovatriptan (FROVA) 2.5 MG tablet Take 2.5 mg by mouth as needed. If recurs, may repeat after 2 hours. Max of 3 tabs in 24 hours.       [name] MELATONIN PO Take 6 mg by mouth.      . Multiple Vitamins-Minerals (MULTIVITAMIN WITH MINERALS) tablet Take 1 tablet by mouth daily.        . polyethylene glycol powder (MIRALAX) powder Take 17 g by mouth daily as needed.       . travoprost, benzalkonium, (TRAVATAN) 0.004 % ophthalmic solution Place 1 drop into both eyes at bedtime.        . vitamin C (ASCORBIC ACID) 500 MG tablet Take 500 mg by mouth daily.        [name] zonisamide (ZONEGRAN) 100 MG  capsule Take 200mg  twice daily       No current facility-administered medications for this visit.     Past Medical History  Diagnosis Date  . Glaucoma   . Mitral valve prolapse   . Palpitations   . Venous insufficiency   . Diverticulosis of colon   . IBS (irritable bowel syndrome)   . Shoulder pain   . Osteopenia   . Migraine headache   . Scoliosis   . Toxic effect of venom     ROS:   All systems reviewed and negative except as noted in the HPI.   Past Surgical History  Procedure Laterality Date  . Back surgery  1994    scoliosis at [facility] center  . Rotator cuff repair    . Cataract extraction  11/11    left     Family History  Problem Relation Age of Onset  . Diabetes Mother      History   Social History  . Marital Status: Married    Spouse Name: Viviann Spare    Number of Children: 2  . Years of Education: N/A   Occupational History  .  Not on file.   Social History Main Topics  . Smoking status: Never Smoker   . Smokeless tobacco: Never Used  . Alcohol Use: No  . Drug Use: No  . Sexual Activity: Yes    Partners: Male     Comment: husband vasectomy   Other Topics Concern  . Not on file   Social History Narrative  . No narrative on file     BP 116/68  Pulse 62  Ht 5\' 4"  (1.626 m)  Wt 125 lb (56.7 kg)  BMI 21.45 kg/m2  LMP 09/13/2000  Physical Exam:  Well appearing middle-age woman,NAD HEENT: Unremarkable Neck:  6 cm JVD, no thyromegally Back:  No CVA tenderness Lungs:  Clear HEART:  Regular rate rhythm, no murmurs, no rubs, no clicks Abd:  soft, positive bowel sounds, no organomegally, no rebound, no guarding Ext:  2 plus pulses, no edema, no cyanosis, no clubbing Skin:  No rashes no nodules Neuro:  CN II through XII intact, motor grossly intact  EKG - normal sinus rhythm  Assess/Plan:

## 2013-05-19 NOTE — Progress Notes (Signed)
Patient ID: Dana Massey, female   DOB: August 20, 1945, 67 y.o.   MRN: [id number] Applied 48hr holter monitor

## 2013-05-19 NOTE — Assessment & Plan Note (Signed)
The etiology of her palpitations is unclear. I recommended that the patient wear a cardiac monitor and she had these episodes on a daily. Additional recommendations will be based on the findings on cardiac monitoring.

## 2013-05-19 NOTE — Patient Instructions (Signed)
Your physician recommends that you schedule a follow-up appointment will depend upon monitor   Your physician has recommended that you wear a holter monitor. Holter monitors are medical devices that record the heart's electrical activity. Doctors most often use these monitors to diagnose arrhythmias. Arrhythmias are problems with the speed or rhythm of the heartbeat. The monitor is a small, portable device. You can wear one while you do your normal daily activities. This is usually used to diagnose what is causing palpitations/syncope (passing out).

## 2013-06-02 ENCOUNTER — Telehealth: Payer: Self-pay | Admitting: Internal Medicine

## 2013-06-02 NOTE — Telephone Encounter (Signed)
Follow Up ° °Pt calling for results °

## 2013-06-02 NOTE — Telephone Encounter (Signed)
Patient aware of monitor results and knows I will discuss with Dr Ladona Ridgel next week to find out follow up

## 2013-06-13 NOTE — Telephone Encounter (Signed)
Follow 3 months  Dana Massey will call patient to schedule

## 2013-07-26 ENCOUNTER — Telehealth: Payer: Self-pay | Admitting: Pulmonary Disease

## 2013-07-26 DIAGNOSIS — R002 Palpitations: Secondary | ICD-10-CM

## 2013-07-26 DIAGNOSIS — G43909 Migraine, unspecified, not intractable, without status migrainosus: Secondary | ICD-10-CM

## 2013-07-26 NOTE — Telephone Encounter (Signed)
Per pt's spouse, insurance recently changed to [facility] Per [employer] guidelines, a referral must be placed by the PCP for any specialists Pt already established with Dr Cristopher Peru (upcoming appt in Mar 2015) and Dr Orie Rout (upcoming appt in April 2015) Referral placed to both physicians Will sign off

## 2013-08-30 ENCOUNTER — Encounter: Payer: Self-pay | Admitting: Pulmonary Disease

## 2013-08-30 MED ORDER — ATENOLOL 25 MG PO TABS: 25.0000 mg | ORAL_TABLET | Freq: Every day | ORAL | Status: DC

## 2013-09-12 ENCOUNTER — Encounter: Payer: Self-pay | Admitting: Internal Medicine

## 2013-09-12 ENCOUNTER — Ambulatory Visit (INDEPENDENT_AMBULATORY_CARE_PROVIDER_SITE_OTHER): Payer: Commercial Managed Care - HMO | Admitting: Internal Medicine

## 2013-09-12 VITALS — BP 128/76 | HR 55 | Ht 64.0 in | Wt 122.0 lb

## 2013-09-12 DIAGNOSIS — R002 Palpitations: Secondary | ICD-10-CM

## 2013-09-12 NOTE — Assessment & Plan Note (Signed)
The patient's symptoms are well controlled. I have asked her to continue her beta blocker, avoid caffiene, ETOH, and any stimulants. She will follow up on an as needed basis.

## 2013-09-12 NOTE — Progress Notes (Signed)
HPI Dana Massey returns today for followup. She had been referred by Dr. Lenna Gilford several months ago with palpitations. She was found to have frequent PAC's and non-sustained SVT, likely atrial tachycardia. She is taking atenolol. She feels well. She denies chest pain or sob. No syncope.   Allergies  Allergen Reactions  . Wasp Venom Anaphylaxis    Has EpiPen     Current Outpatient Prescriptions  Medication Sig Dispense Refill  . aspirin EC 81 MG tablet Take 81 mg by mouth daily.      [name] atenolol (TENORMIN) 25 MG tablet Take 1 tablet (25 mg total) by mouth daily.  90 tablet  0  . brinzolamide (AZOPT) 1 % ophthalmic suspension Place 1 drop into both eyes 2 (two) times daily.       . Calcium-Vitamin D-Vitamin K (VIACTIV) 500-100-40 MG-UNT-MCG CHEW Chew 1 capsule by mouth daily.        . cycloSPORINE (RESTASIS) 0.05 % ophthalmic emulsion Place 1 drop into both eyes 2 (two) times daily.        [name] EPINEPHrine (EPIPEN 2-PAK IJ) Inject as directed. For anaphylaxis      . fish oil-omega-3 fatty acids 1000 MG capsule Take 2 g by mouth daily.        . frovatriptan (FROVA) 2.5 MG tablet Take 2.5 mg by mouth as needed. If recurs, may repeat after 2 hours. Max of 3 tabs in 24 hours.       [name] MELATONIN PO Take 6 mg by mouth at bedtime as needed.       . Multiple Vitamins-Minerals (MULTIVITAMIN WITH MINERALS) tablet Take 1 tablet by mouth daily.        . polyethylene glycol powder (MIRALAX) powder Take 17 g by mouth daily as needed.       . travoprost, benzalkonium, (TRAVATAN) 0.004 % ophthalmic solution Place 1 drop into both eyes at bedtime.        . vitamin C (ASCORBIC ACID) 500 MG tablet Take 500 mg by mouth daily.        [name] zonisamide (ZONEGRAN) 100 MG capsule Take 200mg  twice daily       No current facility-administered medications for this visit.     Past Medical History  Diagnosis Date  . Glaucoma   . Mitral valve prolapse   . Palpitations   . Venous insufficiency   . Diverticulosis  of colon   . IBS (irritable bowel syndrome)   . Shoulder pain   . Osteopenia   . Migraine headache   . Scoliosis   . Toxic effect of venom     ROS:   All systems reviewed and negative except as noted in the HPI.   Past Surgical History  Procedure Laterality Date  . Back surgery  1994    scoliosis at [facility] spine center  . Rotator cuff repair    . Cataract extraction  11/11    left     Family History  Problem Relation Age of Onset  . Diabetes Mother      History   Social History  . Marital Status: Married    Spouse Name: [name]    Number of Children: 2  . Years of Education: N/A   Occupational History  . Not on file.   Social History Main Topics  . Smoking status: Never Smoker   . Smokeless tobacco: Never Used  . Alcohol Use: No  . Drug Use: No  . Sexual Activity: Yes  Partners: Male     Comment: husband vasectomy   Other Topics Concern  . Not on file   Social History Narrative  . No narrative on file     BP 128/76  Pulse 55  Ht 5\' 4"  (1.626 m)  Wt 122 lb (55.339 kg)  BMI 20.93 kg/m2  LMP 09/13/2000  Physical Exam:  Well appearing 68 yo woman, NAD HEENT: Unremarkable Neck:  No JVD, no thyromegally Back:  No CVA tenderness Lungs:  Clear with no wheezes HEART:  Regular rate rhythm, no murmurs, no rubs, no clicks Abd:  soft, positive bowel sounds, no organomegally, no rebound, no guarding Ext:  2 plus pulses, no edema, no cyanosis, no clubbing Skin:  No rashes no nodules Neuro:  CN II through XII intact, motor grossly intact  EKG - nsr   Assess/Plan:

## 2013-09-12 NOTE — Patient Instructions (Signed)
Your physician recommends that you schedule a follow-up appointment as needed  

## 2013-09-19 ENCOUNTER — Encounter: Payer: Self-pay | Admitting: Pulmonary Disease

## 2013-09-19 DIAGNOSIS — M899 Disorder of bone, unspecified: Secondary | ICD-10-CM

## 2013-09-19 DIAGNOSIS — M949 Disorder of cartilage, unspecified: Principal | ICD-10-CM

## 2013-09-26 ENCOUNTER — Telehealth: Payer: Self-pay | Admitting: Pulmonary Disease

## 2013-09-26 DIAGNOSIS — G43909 Migraine, unspecified, not intractable, without status migrainosus: Secondary | ICD-10-CM

## 2013-09-26 NOTE — Telephone Encounter (Signed)
Called  spoke w/ Tammy. Need referral from SN since she has silver back for her HA;s please advise thanks

## 2013-09-27 NOTE — Telephone Encounter (Signed)
Per SN---  Ok to do the referral for Dr. Domingo Cocking for the pt.  We will need to let the pt know about the change in primary care and that she will need to set up with a new primary care doctor.  thanks

## 2013-09-27 NOTE — Telephone Encounter (Signed)
Referral placed for Dr Domingo Cocking.  LM with Tammy at Dr Kirstie Mirza office that referral has been placed.  Spoke with pt and she is working on getting set up with PCP.

## 2013-09-28 ENCOUNTER — Encounter: Payer: Self-pay | Admitting: Internal Medicine

## 2013-09-28 ENCOUNTER — Telehealth: Payer: Self-pay | Admitting: Pulmonary Disease

## 2013-09-28 NOTE — Telephone Encounter (Signed)
Spoke to tammy i will send silverback referral to Rolanda Lundborg

## 2013-09-28 NOTE — Telephone Encounter (Signed)
Spoke with [facility] about this, will forward to [place] to handle this message.  Thank you [facility].

## 2013-10-04 ENCOUNTER — Encounter: Payer: Self-pay | Admitting: Pulmonary Disease

## 2013-11-22 ENCOUNTER — Encounter: Payer: Self-pay | Admitting: Pulmonary Disease

## 2013-12-18 ENCOUNTER — Ambulatory Visit (INDEPENDENT_AMBULATORY_CARE_PROVIDER_SITE_OTHER): Payer: Commercial Managed Care - HMO | Admitting: Family Medicine

## 2013-12-18 ENCOUNTER — Encounter: Payer: Self-pay | Admitting: Family Medicine

## 2013-12-18 VITALS — BP 128/82 | HR 63 | Temp 97.6°F | Ht 64.0 in | Wt 122.8 lb

## 2013-12-18 DIAGNOSIS — Z2911 Encounter for prophylactic immunotherapy for respiratory syncytial virus (RSV): Secondary | ICD-10-CM

## 2013-12-18 DIAGNOSIS — M899 Disorder of bone, unspecified: Secondary | ICD-10-CM

## 2013-12-18 DIAGNOSIS — R002 Palpitations: Secondary | ICD-10-CM

## 2013-12-18 DIAGNOSIS — Z136 Encounter for screening for cardiovascular disorders: Secondary | ICD-10-CM

## 2013-12-18 DIAGNOSIS — K5730 Diverticulosis of large intestine without perforation or abscess without bleeding: Secondary | ICD-10-CM

## 2013-12-18 DIAGNOSIS — Z23 Encounter for immunization: Secondary | ICD-10-CM

## 2013-12-18 DIAGNOSIS — M949 Disorder of cartilage, unspecified: Secondary | ICD-10-CM

## 2013-12-18 DIAGNOSIS — Z0000 Encounter for general adult medical examination without abnormal findings: Secondary | ICD-10-CM

## 2013-12-18 LAB — COMPREHENSIVE METABOLIC PANEL
ALK PHOS: 52 U/L (ref 39–117)
ALT: 14 U/L (ref 0–35)
AST: 17 U/L (ref 0–37)
Albumin: 4.2 g/dL (ref 3.5–5.2)
BUN: 23 mg/dL (ref 6–23)
CALCIUM: 9.5 mg/dL (ref 8.4–10.5)
CHLORIDE: 105 meq/L (ref 96–112)
CO2: 30 mEq/L (ref 19–32)
CREATININE: 1.1 mg/dL (ref 0.4–1.2)
GFR: 53.74 mL/min — ABNORMAL LOW (ref >60.00)
Glucose, Bld: 109 mg/dL — ABNORMAL HIGH (ref 70–99)
POTASSIUM: 4.2 meq/L (ref 3.5–5.1)
Sodium: 140 mEq/L (ref 135–145)
Total Bilirubin: 0.8 mg/dL (ref 0.2–1.2)
Total Protein: 7.4 g/dL (ref 6.0–8.3)

## 2013-12-18 LAB — CBC WITH DIFFERENTIAL/PLATELET
BASOS ABS: 0.1 10*3/uL (ref 0.0–0.1)
Basophils Relative: 0.8 % (ref 0.0–3.0)
EOS ABS: 0.1 10*3/uL (ref 0.0–0.7)
Eosinophils Relative: 1.8 % (ref 0.0–5.0)
HCT: 44.6 % (ref 36.0–46.0)
Hemoglobin: 15.1 g/dL — ABNORMAL HIGH (ref 12.0–15.0)
LYMPHS PCT: 19.2 % (ref 12.0–46.0)
Lymphs Abs: 1.2 10*3/uL (ref 0.7–4.0)
MCHC: 33.8 g/dL (ref 30.0–36.0)
MCV: 96.9 fl (ref 78.0–100.0)
Monocytes Absolute: 0.4 10*3/uL (ref 0.1–1.0)
Monocytes Relative: 6 % (ref 3.0–12.0)
NEUTROS ABS: 4.6 10*3/uL (ref 1.4–7.7)
NEUTROS PCT: 72.2 % (ref 43.0–77.0)
Platelets: 168 10*3/uL (ref 150.0–400.0)
RBC: 4.6 Mil/uL (ref 3.87–5.11)
RDW: 13.9 % (ref 11.5–15.5)
WBC: 6.4 10*3/uL (ref 4.0–10.5)

## 2013-12-18 LAB — LIPID PANEL
CHOL/HDL RATIO: 3
Cholesterol: 211 mg/dL — ABNORMAL HIGH (ref 0–200)
HDL: 67.7 mg/dL (ref >39.00)
LDL CALC: 131 mg/dL — AB (ref 0–99)
NONHDL: 143.3
Triglycerides: 64 mg/dL (ref 0.0–149.0)
VLDL: 12.8 mg/dL (ref 0.0–40.0)

## 2013-12-18 LAB — TSH: TSH: 1.13 u[IU]/mL (ref 0.35–4.50)

## 2013-12-18 NOTE — Progress Notes (Addendum)
Subjective:   Patient ID: Dana Massey, female    DOB: August 07, 1945, 67 y.o.   MRN: [id number]  Dana Massey is a pleasant 68 y.o. year old female who presents to clinic today with [place] and Annual Exam  on 12/18/2013  HPI:  I have personally reviewed the Medicare Annual Wellness questionnaire and have noted 1. The patient's medical and social history 2. Their use of alcohol, tobacco or illicit drugs 3. Their current medications and supplements 4. The patient's functional ability including ADL's, fall risks, home safety risks and hearing or visual             impairment. 5. Diet and physical activities 6. Evidence for depression or mood disorders  She has had a dilated eye exam in the last year.  Has been followed by Dr. Lenna Gilford. Last saw him on 04/28/2013- notes reviewed.  End of life wishes discussed and updated in Social History. The roster of all physicians providing medical care to patient - is listed in the Snapshot section of the chart.  Colonoscopy in 10/2007- Dr. Henrene Pastor- 10 year recall.  GYN- Dr. Sabra Heck- UTD pap, mammo Mammogram 10/04/12 DEXA 11/22/12 Pneumovax 11/2011 Td 2011   Palpitations- saw Dr. Lovena Le (cardiology) on 11/25/12- note reviewed. Advised to continue beta blocker.  No recurrent palpations.  Lab Results  Component Value Date   CHOL 207* 11/25/2012   HDL 67.80 11/25/2012   LDLCALC 102* 11/20/2011   LDLDIRECT 126.9 11/25/2012   TRIG 48.0 11/25/2012   CHOLHDL 3 11/25/2012    Current Outpatient Prescriptions on File Prior to Visit  Medication Sig Dispense Refill  . aspirin EC 81 MG tablet Take 81 mg by mouth daily.      [name] atenolol (TENORMIN) 25 MG tablet Take 1 tablet (25 mg total) by mouth daily.  90 tablet  0  . brinzolamide (AZOPT) 1 % ophthalmic suspension Place 1 drop into both eyes 2 (two) times daily.       . Calcium-Vitamin D-Vitamin K (VIACTIV) 500-100-40 MG-UNT-MCG CHEW Chew 1 capsule by mouth daily.        . cycloSPORINE  (RESTASIS) 0.05 % ophthalmic emulsion Place 1 drop into both eyes 2 (two) times daily.        [name] EPINEPHrine (EPIPEN 2-PAK IJ) Inject as directed. For anaphylaxis      . fish oil-omega-3 fatty acids 1000 MG capsule Take 2 g by mouth daily.        [name] MELATONIN PO Take 6 mg by mouth at bedtime as needed.       . Multiple Vitamins-Minerals (MULTIVITAMIN WITH MINERALS) tablet Take 1 tablet by mouth daily.        . polyethylene glycol powder (MIRALAX) powder Take 17 g by mouth daily as needed.       . travoprost, benzalkonium, (TRAVATAN) 0.004 % ophthalmic solution Place 1 drop into both eyes at bedtime.        . vitamin C (ASCORBIC ACID) 500 MG tablet Take 500 mg by mouth daily.        [name] zonisamide (ZONEGRAN) 100 MG capsule Take 200mg  twice daily       No current facility-administered medications on file prior to visit.    Allergies  Allergen Reactions  . Wasp Venom Anaphylaxis    Has EpiPen    Past Medical History  Diagnosis Date  . Glaucoma   . Mitral valve prolapse   . Palpitations   . Venous insufficiency   . Diverticulosis of colon   .  IBS (irritable bowel syndrome)   . Shoulder pain   . Osteopenia   . Migraine headache   . Scoliosis   . Toxic effect of venom(989.5)     Past Surgical History  Procedure Laterality Date  . Back surgery  1994    scoliosis at [facility] spine center  . Rotator cuff repair    . Cataract extraction  11/11    left    Family History  Problem Relation Age of Onset  . Diabetes Mother     History   Social History  . Marital Status: Married    Spouse Name: [name]    Number of Children: 2  . Years of Education: N/A   Occupational History  . Not on file.   Social History Main Topics  . Smoking status: Never Smoker   . Smokeless tobacco: Never Used  . Alcohol Use: No  . Drug Use: No  . Sexual Activity: Yes    Partners: Male     Comment: husband vasectomy   Other Topics Concern  . Not on file   Social History Narrative   Does not have a  living will.   Desires CPR but would not want prolonged life support.         The PMH, PSH, Social History, Family History, Medications, and allergies have been reviewed in [facility], and have been updated if relevant.   Review of Systems See HPI Patient reports no  vision/ hearing changes,anorexia, weight change, fever ,adenopathy, persistant / recurrent hoarseness, swallowing issues, chest pain, edema,persistant / recurrent cough, hemoptysis, dyspnea(rest, exertional, paroxysmal nocturnal), gastrointestinal  bleeding (melena, rectal bleeding), abdominal pain, excessive heart burn, GU symptoms(dysuria, hematuria, pyuria, voiding/incontinence  Issues) syncope, focal weakness, severe memory loss, concerning skin lesions, depression, anxiety, abnormal bruising/bleeding, major joint swelling, breast masses or abnormal vaginal bleeding.       Objective:    BP 128/82  Pulse 63  Temp(Src) 97.6 F (36.4 C) (Oral)  Ht 5\' 4"  (1.626 m)  Wt 122 lb 12 oz (55.679 kg)  BMI 21.06 kg/m2  SpO2 99%  LMP 09/13/2000   Physical Exam  General:  Well-developed,well-nourished,in no acute distress; alert,appropriate and cooperative throughout examination Head:  normocephalic and atraumatic.   Eyes:  vision grossly intact, pupils equal, pupils round, and pupils reactive to light.   Ears:  R ear normal and L ear normal.   Nose:  no external deformity.   Mouth:  good dentition.   Neck:  No deformities, masses, or tenderness noted. Lungs:  Normal respiratory effort, chest expands symmetrically. Lungs are clear to auscultation, no crackles or wheezes. Heart:  Normal rate and regular rhythm. S1 and S2 normal without gallop, murmur, click, rub or other extra sounds. Abdomen:  Bowel sounds positive,abdomen soft and non-tender without masses, organomegaly or hernias noted. osis noted of thoracic or lumbar spine.   Extremities:  No clubbing, cyanosis, or deformity noted with normal full range of motion of all joints.    Left lower leg 1+ edema (chronic).  Neurologic:  alert & oriented X3 and gait normal.   Skin:  Intact without suspicious lesions or rashes Cervical Nodes:  No lymphadenopathy noted Axillary Nodes:  No palpable lymphadenopathy Psych:  Cognition and judgment appear intact. Alert and cooperative with normal attention span and concentration. No apparent delusions, illusions, hallucinations        Assessment & Plan:   DIVERTICULOSIS OF COLON  Medicare annual wellness visit, initial - Plan: CBC with Differential, Comprehensive metabolic panel, TSH  Palpitations  OSTEOPENIA  Screening for ischemic heart disease - Plan: Lipid panel  Need for shingles vaccine - Plan: Varicella-zoster vaccine subcutaneous  Need for pneumococcal vaccination - Plan: Pneumococcal conjugate vaccine 13-valent IM No Follow-up on file.

## 2013-12-18 NOTE — Addendum Note (Signed)
Addended by: Modena Nunnery on: 12/18/2013 11:26 AM   Modules accepted: Orders

## 2013-12-18 NOTE — Assessment & Plan Note (Signed)
Colonoscopy UTD. 

## 2013-12-18 NOTE — Patient Instructions (Addendum)
It was nice to meet you. Please schedule a medicare wellness visit on your way out today.  We will call you with your lab results and you can view them online.

## 2013-12-18 NOTE — Assessment & Plan Note (Signed)
The patients weight, height, BMI and visual acuity have been recorded in the chart I have made referrals, counseling and provided education to the patient based review of the above and I have provided the pt with a written personalized care plan for preventive services.  Labs today.  Zostavax, prevnar 13  Orders Placed This Encounter  Procedures  . CBC with Differential  . Comprehensive metabolic panel  . Lipid panel  . TSH

## 2013-12-18 NOTE — Progress Notes (Signed)
Pre visit review using our clinic review tool, if applicable. No additional management support is needed unless otherwise documented below in the visit note. 

## 2013-12-18 NOTE — Assessment & Plan Note (Signed)
Asymptomatic. Continue current rx.

## 2013-12-25 ENCOUNTER — Other Ambulatory Visit: Payer: Self-pay

## 2013-12-25 MED ORDER — ATENOLOL 25 MG PO TABS: 25.0000 mg | ORAL_TABLET | Freq: Every day | ORAL | Status: DC

## 2013-12-25 NOTE — Telephone Encounter (Signed)
Pt request refill atenolol to [facility] order pharmacy.advised done.

## 2013-12-26 ENCOUNTER — Encounter: Payer: Self-pay | Admitting: Family Medicine

## 2013-12-29 NOTE — Telephone Encounter (Signed)
Cholesterol lowering information mailed as requested

## 2014-02-21 ENCOUNTER — Ambulatory Visit (INDEPENDENT_AMBULATORY_CARE_PROVIDER_SITE_OTHER): Payer: Commercial Managed Care - HMO

## 2014-02-21 DIAGNOSIS — Z23 Encounter for immunization: Secondary | ICD-10-CM

## 2014-02-22 ENCOUNTER — Encounter: Payer: Self-pay | Admitting: Family Medicine

## 2014-02-22 ENCOUNTER — Other Ambulatory Visit: Payer: Self-pay | Admitting: Family Medicine

## 2014-02-22 ENCOUNTER — Telehealth: Payer: Self-pay | Admitting: Pulmonary Disease

## 2014-02-22 DIAGNOSIS — G43909 Migraine, unspecified, not intractable, without status migrainosus: Secondary | ICD-10-CM

## 2014-02-22 NOTE — Telephone Encounter (Signed)
Called spoke with [place] from [facility] wellness. Made her aware we are no longer pt pcp. She needs to call Dr Hulen Shouts office. Nothing further needed

## 2014-03-07 ENCOUNTER — Encounter: Payer: Self-pay | Admitting: Family Medicine

## 2014-04-16 ENCOUNTER — Encounter: Payer: Self-pay | Admitting: Family Medicine

## 2014-05-15 ENCOUNTER — Ambulatory Visit (INDEPENDENT_AMBULATORY_CARE_PROVIDER_SITE_OTHER): Payer: Commercial Managed Care - HMO | Admitting: Family Medicine

## 2014-05-15 ENCOUNTER — Encounter: Payer: Self-pay | Admitting: Family Medicine

## 2014-05-15 ENCOUNTER — Ambulatory Visit (INDEPENDENT_AMBULATORY_CARE_PROVIDER_SITE_OTHER)
Admission: RE | Admit: 2014-05-15 | Discharge: 2014-05-15 | Disposition: A | Discharge status: Home / Self Care | Payer: Commercial Managed Care - HMO | Source: Ambulatory Visit | Attending: Family Medicine | Admitting: Family Medicine

## 2014-05-15 VITALS — BP 124/74 | HR 58 | Temp 97.5°F | Wt 124.2 lb

## 2014-05-15 DIAGNOSIS — M79671 Pain in right foot: Secondary | ICD-10-CM

## 2014-05-15 NOTE — Progress Notes (Signed)
Pre visit review using our clinic review tool, if applicable. No additional management support is needed unless otherwise documented below in the visit note. 

## 2014-05-15 NOTE — Patient Instructions (Signed)
Great to see you.  I will call you with your xray results. 

## 2014-05-16 NOTE — Progress Notes (Signed)
Subjective:   Patient ID: Dana Massey, female    DOB: June 09, 1946, 68 y.o.   MRN: [id number]  Dana Massey is a pleasant 68 y.o. year old female who presents to clinic today with Foot Pain  on 05/15/2014  HPI: Having right foot pain for over 2 weeks.  Only on "ball of foot," and with weight bearing. No known injury.  No change in foot wear.  She has been followed by neuro for "cold, blue" right foot. Per pt, EMG was normal.  No swelling or warmth of foot.  No fevers.  Current Outpatient Prescriptions on File Prior to Visit  Medication Sig Dispense Refill  . aspirin EC 81 MG tablet Take 81 mg by mouth daily.    [name] atenolol (TENORMIN) 25 MG tablet Take 1 tablet (25 mg total) by mouth daily. 90 tablet 1  . brinzolamide (AZOPT) 1 % ophthalmic suspension Place 1 drop into both eyes 2 (two) times daily.     . Calcium-Vitamin D-Vitamin K (VIACTIV) 500-100-40 MG-UNT-MCG CHEW Chew 1 capsule by mouth daily.      . cycloSPORINE (RESTASIS) 0.05 % ophthalmic emulsion Place 1 drop into both eyes 2 (two) times daily.      [name] EPINEPHrine (EPIPEN 2-PAK IJ) Inject as directed. For anaphylaxis    . fish oil-omega-3 fatty acids 1000 MG capsule Take 2 g by mouth daily.      [name] MELATONIN PO Take 6 mg by mouth at bedtime as needed.     . Multiple Vitamins-Minerals (MULTIVITAMIN WITH MINERALS) tablet Take 1 tablet by mouth daily.      . polyethylene glycol powder (MIRALAX) powder Take 17 g by mouth daily as needed.     . travoprost, benzalkonium, (TRAVATAN) 0.004 % ophthalmic solution Place 1 drop into both eyes at bedtime.      . vitamin C (ASCORBIC ACID) 500 MG tablet Take 500 mg by mouth daily.      [name] zonisamide (ZONEGRAN) 100 MG capsule Take 200mg  twice daily     No current facility-administered medications on file prior to visit.    Allergies  Allergen Reactions  . Wasp Venom Anaphylaxis    Has EpiPen    Past Medical History  Diagnosis Date  . Glaucoma   . Mitral valve prolapse   .  Palpitations   . Venous insufficiency   . Diverticulosis of colon   . IBS (irritable bowel syndrome)   . Shoulder pain   . Osteopenia   . Migraine headache   . Scoliosis   . Toxic effect of venom(989.5)     Past Surgical History  Procedure Laterality Date  . Back surgery  1994    scoliosis at [facility] spine center  . Rotator cuff repair    . Cataract extraction  11/11    left    Family History  Problem Relation Age of Onset  . Diabetes Mother     History   Social History  . Marital Status: Married    Spouse Name: [name]    Number of Children: 2  . Years of Education: N/A   Occupational History  . Not on file.   Social History Main Topics  . Smoking status: Never Smoker   . Smokeless tobacco: Never Used  . Alcohol Use: No  . Drug Use: No  . Sexual Activity:    Partners: Male     Comment: husband vasectomy   Other Topics Concern  . Not on file   Social History Narrative  Does not have a living will.   Desires CPR but would not want prolonged life support.         The PMH, PSH, Social History, Family History, Medications, and allergies have been reviewed in [facility], and have been updated if relevant.   Review of Systems  Musculoskeletal: Negative for gait problem.  Skin: Positive for color change.  Neurological: Negative.   Hematological: Negative.   Psychiatric/Behavioral: Negative.   All other systems reviewed and are negative.      Objective:    BP 124/74 mmHg  Pulse 58  Temp(Src) 97.5 F (36.4 C) (Oral)  Wt 124 lb 4 oz (56.359 kg)  SpO2 98%  LMP 09/13/2000   Physical Exam  Constitutional: She appears well-developed and well-nourished. No distress.  HENT:  Head: Normocephalic.  Cardiovascular: Normal rate.   Musculoskeletal:       Right ankle: Normal. She exhibits no swelling.       Feet:  Psychiatric: She has a normal mood and affect. Her behavior is normal. Judgment and thought content normal.  Nursing note and vitals  reviewed.         Assessment & Plan:   Right foot pain - Plan: DG Foot Complete Right No Follow-up on file.

## 2014-05-16 NOTE — Assessment & Plan Note (Signed)
Unclear etiology at this point. Does not have significant pes planus.  May still benefit from metatarsal pad/ortho inserts. Will get foot xray first.  Will also request neuro records for cold/blue foot- unclear if this is related, although unlikely given duration of symptoms.

## 2014-06-04 ENCOUNTER — Ambulatory Visit (INDEPENDENT_AMBULATORY_CARE_PROVIDER_SITE_OTHER): Payer: Commercial Managed Care - HMO | Admitting: Family Medicine

## 2014-06-04 ENCOUNTER — Encounter: Payer: Self-pay | Admitting: Family Medicine

## 2014-06-04 VITALS — BP 100/78 | HR 67 | Temp 97.7°F | Ht 64.0 in | Wt 124.8 lb

## 2014-06-04 DIAGNOSIS — G90521 Complex regional pain syndrome I of right lower limb: Secondary | ICD-10-CM

## 2014-06-04 NOTE — Progress Notes (Signed)
Pre visit review using our clinic review tool, if applicable. No additional management support is needed unless otherwise documented below in the visit note. 

## 2014-06-04 NOTE — Progress Notes (Signed)
Dr. Frederico Hamman T. Patterson Hollenbaugh, MD, [facility] [place], [address] Phone: [phone] Fax: [phone]  06/04/2014  Patient: Dana Massey, MRN: [id number], DOB: [birth year], 69 y.o.  Primary Physician:  Arnette Norris, MD  Chief Complaint: Foot Pain  Subjective:   Dana Massey is a 68 y.o. very pleasant female patient who presents with the following:  Pleasant 68 year old patient who is status post scoliosis surgery in 1974, And she has had a long-standing blue, cold foot on the RIGHT.  She also has some intermittent coolness as well as hotness and some paresthesias on the RIGHT.  She has had EMGs which have been normal.  She also has good vascular flow to that foot.  It is not very with temperature exposure.  When it is in hot water it does become a little bit redder.  In general however all toes and the forefoot are blue, purplish in coloration.  My partner wanted to have me evaluate her for forefoot pain, but at this point that pain has completely resolved.  2010 ABI > 1 bilaterally  RSD, 7 years  Past Medical History, Surgical History, Social History, Family History, Problem List, Medications, and Allergies have been reviewed and updated if relevant.  GEN: No fevers, chills. Nontoxic. Primarily MSK c/o today. MSK: Detailed in the HPI GI: tolerating PO intake without difficulty Neuro: as above Otherwise the pertinent positives of the ROS are noted above.   Objective:   BP 100/78 mmHg  Pulse 67  Temp(Src) 97.7 F (36.5 C) (Oral)  Ht 5\' 4"  (1.626 m)  Wt 124 lb 12 oz (56.586 kg)  BMI 21.40 kg/m2  LMP 09/13/2000   GEN: WDWN, NAD, Non-toxic, Alert & Oriented x 3 HEENT: Atraumatic, Normocephalic.  Ears and Nose: No external deformity. NEURO: Normal gait.  PSYCH: Normally interactive. Conversant. Not depressed or anxious appearing.  Calm demeanor.   FEET: B Echymosis: forefoot is blue and cool Edema:  no ROM: full LE B Gait: heel toe, non-antalgic MT pain: no Callus pattern: none Lateral Mall: NT Medial Mall: NT Talus: NT Navicular: NT Cuboid: NT Calcaneous: NT Metatarsals: NT 5th MT: NT Phalanges: NT Achilles: NT Plantar Fascia: NT Fat Pad: NT Peroneals: NT Post Tib: NT Great Toe: Nml motion Ant Drawer: neg ATFL: NT CFL: NT Deltoid: NT Other foot breakdown: none Long arch: preserved Transverse arch: preserved Hindfoot breakdown: none Sensation: intact   Radiology: Dg Foot Complete Right  05/15/2014   CLINICAL DATA:  RIGHT foot pain. Pain across the ball of foot for 1 week.  EXAM: RIGHT FOOT COMPLETE - 3+ VIEW  COMPARISON:  None.  FINDINGS: Anatomic alignment. No fracture. Small likely dystrophic calcification is present adjacent to the lateral great toe sesamoid bone. No sesamoid bone fracture is identified. MTP joints appear within normal limits. No erosive changes.  IMPRESSION: Negative.   Electronically Signed   By: Dereck Ligas M.D.   On: 05/15/2014 16:27    Assessment and Plan:   Reflex sympathetic dystrophy of lower extremity, right  >25 minutes spent in face to face time with patient, >50% spent in counselling or coordination of care: With a normal EMG, ABIs greater than 1, and a history of spine surgery, I think that you can assume that this is RSD on the RIGHT of a long-standing history.  The patient's forefoot pain has completely resolved.  We talked about RSD in general.  I do not think this patient has Raynaud's  phenomenon.  Consideration for potential interventions would include try cyclic antidepressants or neuropathic pain medications such as gabapentin or Lyrica.  At this point, the patient does not really want to add any kind of additional medication to the regimen.  This is reasonable, but if she becomes more symptomatic, I typically would start with a dose of Elavil at 10-25 mg daily at bedtime.  Signed,  Maud Deed. Egor Fullilove, MD   Patient's  Medications  New Prescriptions   No medications on file  Previous Medications   ASPIRIN EC 81 MG TABLET    Take 81 mg by mouth daily.   ATENOLOL (TENORMIN) 25 MG TABLET    Take 1 tablet (25 mg total) by mouth daily.   BRINZOLAMIDE (AZOPT) 1 % OPHTHALMIC SUSPENSION    Place 1 drop into both eyes 2 (two) times daily.    CALCIUM-VITAMIN D-VITAMIN K (VIACTIV) 500-100-40 MG-UNT-MCG CHEW    Chew 1 capsule by mouth daily.     CYCLOSPORINE (RESTASIS) 0.05 % OPHTHALMIC EMULSION    Place 1 drop into both eyes 2 (two) times daily.     EPINEPHRINE (EPIPEN 2-PAK IJ)    Inject as directed. For anaphylaxis   FISH OIL-OMEGA-3 FATTY ACIDS 1000 MG CAPSULE    Take 2 g by mouth daily.     LATANOPROST (XALATAN) 0.005 % OPHTHALMIC SOLUTION    Place 1 drop into both eyes at bedtime.   MELATONIN PO    Take 6 mg by mouth at bedtime as needed.    MULTIPLE VITAMINS-MINERALS (MULTIVITAMIN WITH MINERALS) TABLET    Take 1 tablet by mouth daily.     POLYETHYLENE GLYCOL POWDER (MIRALAX) POWDER    Take 17 g by mouth daily as needed.    VITAMIN C (ASCORBIC ACID) 500 MG TABLET    Take 500 mg by mouth daily.     ZONISAMIDE (ZONEGRAN) 100 MG CAPSULE    Take 200mg  twice daily  Modified Medications   No medications on file  Discontinued Medications   TRAVOPROST, BENZALKONIUM, (TRAVATAN) 0.004 % OPHTHALMIC SOLUTION    Place 1 drop into both eyes at bedtime.

## 2014-06-05 DIAGNOSIS — G90529 Complex regional pain syndrome I of unspecified lower limb: Secondary | ICD-10-CM | POA: Insufficient documentation

## 2014-06-11 ENCOUNTER — Encounter: Payer: Self-pay | Admitting: Family Medicine

## 2014-06-11 MED ORDER — ATENOLOL 25 MG PO TABS: 25.0000 mg | ORAL_TABLET | Freq: Every day | ORAL | Status: DC

## 2014-06-13 ENCOUNTER — Encounter: Payer: Self-pay | Admitting: Family Medicine

## 2014-06-13 MED ORDER — ATENOLOL 25 MG PO TABS: 25.0000 mg | ORAL_TABLET | Freq: Every day | ORAL | Status: DC

## 2014-07-03 ENCOUNTER — Encounter: Payer: Self-pay | Admitting: Family Medicine

## 2014-07-03 DIAGNOSIS — G43909 Migraine, unspecified, not intractable, without status migrainosus: Secondary | ICD-10-CM

## 2014-07-03 NOTE — Telephone Encounter (Signed)
Headache referral placed but I cannot find form in my box she is asking about.  Can you call [facility] and ask them to refax? Thanks

## 2014-07-16 ENCOUNTER — Encounter: Payer: Self-pay | Admitting: Family Medicine

## 2014-07-16 NOTE — Telephone Encounter (Signed)
I see that you read pts message. Do you, by chance, know anything about a form for completion for Silver[employer]neakers?

## 2014-07-16 NOTE — Telephone Encounter (Signed)
I havent seen or completed any forms for her, and I dont have it in my "faxed" folder either. Is pt needing to have another form sent for completion?

## 2014-07-17 NOTE — Telephone Encounter (Signed)
Spoke to pt and advised her that per Dr Deborra Medina, she did not receive a form for completion. Requested pt have it sent to direct fax

## 2014-07-17 NOTE — Telephone Encounter (Signed)
Pt left v/m requesting status of form for pt to participate in exercise program with silver sneakers. Pt request cb at [phone].

## 2014-07-18 NOTE — Telephone Encounter (Signed)
Spoke to pt and informed her that form was not received. Pt states that she will have form re-faxed

## 2014-07-26 ENCOUNTER — Encounter: Payer: Self-pay | Admitting: Family Medicine

## 2014-07-26 NOTE — Telephone Encounter (Signed)
Im not sure that the form was ever received.No record of form in pt chart. Contacted [place] personally and requested they fax form for completion. States he will attempt to find additional info;will refax form and contact office if they have any additional questions

## 2014-08-20 DIAGNOSIS — Z1231 Encounter for screening mammogram for malignant neoplasm of breast: Secondary | ICD-10-CM | POA: Diagnosis not present

## 2014-08-21 ENCOUNTER — Encounter: Payer: Self-pay | Admitting: Family Medicine

## 2014-09-11 ENCOUNTER — Encounter: Payer: Self-pay | Admitting: Family Medicine

## 2014-09-11 MED ORDER — ATENOLOL 25 MG PO TABS: 25.0000 mg | ORAL_TABLET | Freq: Every day | ORAL | Status: DC

## 2014-09-18 DIAGNOSIS — H5210 Myopia, unspecified eye: Secondary | ICD-10-CM | POA: Diagnosis not present

## 2014-09-18 DIAGNOSIS — Z0100 Encounter for examination of eyes and vision without abnormal findings: Secondary | ICD-10-CM | POA: Diagnosis not present

## 2014-09-21 DIAGNOSIS — G43019 Migraine without aura, intractable, without status migrainosus: Secondary | ICD-10-CM | POA: Diagnosis not present

## 2014-09-21 DIAGNOSIS — G43719 Chronic migraine without aura, intractable, without status migrainosus: Secondary | ICD-10-CM | POA: Diagnosis not present

## 2014-10-05 NOTE — H&P (Signed)
PATIENT NAME:  Dana Massey, Dana Massey MR#:  [id number] DATE OF BIRTH:  Mar 03, 1946  DATE OF ADMISSION:  07/16/2012  PRIMARY CARE PHYSICIAN: Dr. Lenna Gilford in [place].   REFERRING PHYSICIAN: Dr. Cinda Quest.  CHIEF COMPLAINT: Allergic reaction.  HISTORY OF PRESENT ILLNESS: The patient is a 69 year old pleasant Caucasian female with a past medical history of mitral valve prolapse, chronic migraine headaches, came into the ER with a chief complaint of shortness of breath. The patient suddenly started having shortness of breath at around 8:00 p.m. today. On her way to ER, she started noticing some rash on her upper extremities associated with itching. Eventually her face was flushed and the patient was tachycardic. When she came in to the ER, her initial vital signs were temperature 97.9, pulse 89, respiratory rate 24, blood pressure 141/80 with a pulse oximetry of 89% on room air. Immediately, the patient was placed on 2 liters of oxygen. EpiPen subcutaneous was given and Solu-Medrol 125 mg IV was given. The patient has received Benadryl, Zantac and breathing treatment. Subsequently, the patient started feeling better but she has noticed some raspy voice. Shortness of breath is improved. As the patient is concerned about raspy voice, the ER physician has asked hospitalist team to admit the patient for observation overnight. During my examination, the patient's shortness of breath is completely resolved and the rash and itching is also resolved. The patient denies using any new medications, detergents, chemicals today. She could not recall what could have caused her allergic reaction. She is looking comfortable and answering all questions appropriately, but concerned about the raspy voice. 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 The patient denies any chest pain or chest tightness. No other complaints.    PAST MEDICAL HISTORY: Mitral valve prolapse and migraine headaches.   PAST SURGICAL HISTORY: Spinal fusion, cataract repair, rotator cuff surgery.   ALLERGIES: She has no known drug allergies.   HOME MEDICATIONS: Vitamin C 500 one tablet p.o. once daily, Premarin 2.355 mg 1 application vaginal 2 times, multivitamin 1 tablet p.o. once daily, atenolol 25 mg 1 tablet once daily, aspirin 81 mg once a day.   PSYCHOSOCIAL HISTORY: Lives at home with husband. Denies smoking, alcohol or illicit drug usage.   FAMILY HISTORY: Dad is healthy. Mom has chronic back problems.  REVIEW OF SYSTEMS:  CONSTITUTIONAL: Denies any fever, fatigue, weakness, pain, weight loss, weight gain.  EYES: Denies blurry vision, inflammation, glaucoma, cataracts.  ENT: Denies tinnitus, ear pain, ear discharge, snoring, postnasal drip.  RESPIRATORY: Denies cough, wheezing, hemoptysis, dyspnea.  CARDIOVASCULAR: Denies any chest pain, palpitations, syncope. Chronic mitral valve prolapse. GASTROINTESTINAL: Denies nausea, vomiting, diarrhea, GERD.  GENITOURINARY: Denies dysuria, hematuria, renal calculi. GYNECOLOGIC: Denies breast mass or vaginal discharge.  ENDOCRINE: Denies polyuria, nocturia, thyroid problems.  HEMATOLOGIC, LYMPHATIC: Denies anemia, easy bruising.  INTEGUMENTARY: Rash has resolved. Denies any skin lesions or acne.  MUSCULOSKELETAL: Chronic low back pain but denies any neck pain, shoulder pain, knee pain. NEUROLOGIC: Denies any ataxia, vertigo, weakness, dysarthria, epilepsy. PSYCHIATRIC: Denies insomnia, ADD, OCD.   PHYSICAL EXAMINATION:  VITAL SIGNS: Temperature 97.9, pulse 90, respirations 18, blood pressure 109/52.  GENERAL APPEARANCE: Not in acute distress, thin built and moderately nourished.  HEENT: Normocephalic, atraumatic. Pupils are equally reacting to light and accommodation. No conjunctival injection. No scleral icterus. No postnasal drip. No pharyngeal or laryngeal edema. No lip swelling.  No sinus tenderness.  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  NECK: Supple. No JVD. No tracheal deviation on palpation. No thyromegaly.  LUNGS: Clear to auscultation bilaterally. Moderate air entry. No crackles. No wheezing. No accessory muscle usage. No anterior chest wall tenderness.  CARDIAC: S1, S2 normal. Regular rate and rhythm. Positive murmur from mitral valve prolapse.  GASTROINTESTINAL: Soft. Bowel sounds are positive in all 4 quadrants. Nontender, nondistended. No masses. NEUROLOGIC: Awake, alert, oriented x3. Motor and sensory are grossly intact. Reflexes are 2+.  SKIN: No rashes. No lesions. No acne.  HEMATOLOGIC, LYMPHATIC: No bruising. No petechiae.  MUSCULOSKELETAL: No joint effusion, tenderness, erythema.   LABORATORY DATA: Glucose 131, BUN 28, creatinine 1.09, sodium 141, potassium 3.8, chloride 108, CO2 if 27 anion gap 6, GFR greater than 53. Troponin T less than 0.02. WBC 9.3, hemoglobin 15.2, hematocrit 45.4, platelet count 185.  ASSESSMENT AND PLAN:  1.  Allergic reaction of unknown etiology. The patient has received EpiPen x 1. Solu-Medrol, Zantac and Benadryl were given. Nebulizer treatments were given. Condition is stable at this time. Will admit the patient under observation status for overnight observation. Will give her EpiPen on as-needed basis if the patient has laryngeal edema or chest tightness. Will provide her albuterol nebulizer treatments as needed for shortness of breath. Will give her prednisone, Zantac and Benadryl as needed.  2.  History of mitral valve prolapse, stable. Will continue atenolol.  3.  Migraine headache, stable. 4.  Will provide her gastrointestinal prophylaxis with Zantac. Deep vein thrombosis prophylaxis is not needed, as the patient is ambulatory. 5.  The plan of care was discussed in detail with the patient.   CODE STATUS: She is full code.   TOTAL TIME SPENT  ON HISTORY AND PHYSICAL: 45 minutes.   ____________________________ Nicholes Mango, MD ag:jm D: 07/16/2012 01:43:13 ET T: 07/16/2012 13:26:32 ET JOB#: [id number]  cc: Nicholes Mango, MD, <Dictator> Nicholes Mango MD ELECTRONICALLY SIGNED 07/19/2012 23:46

## 2014-10-05 NOTE — Discharge Summary (Signed)
PATIENT NAME:  Dana Massey, Dana Massey MR#:  [id number] DATE OF BIRTH:  1946/04/13  DATE OF ADMISSION:  07/16/2012 DATE OF DISCHARGE:  07/16/2012  DISCHARGE DIAGNOSES:  1.  Allergic reaction.  2.  Wheezing secondary to allergy.   IMAGING STUDIES DONE: Include a chest x-ray which showed no acute abnormalities.   Admitting history and physical: Please see detailed history and physical dictated by Dr. Margaretmary Eddy. In brief, 69 year old female patient brought into the hospital with some dysphagia, shortness of breath, wheezing, a second episode, and diffuse rash. The patient was found to have allergic reaction, given steroids, epinephrine, and admitted to the hospitalist service as she still had change in voice and swallowing  HOSPITAL COURSE: The patient improved well being on steroids. She likely had a late reaction on day two after initial contact with allergen. Presently, the etiology is unknown. We will put the patient on a prednisone taper along with giving her a prescription for EpiPen for future use, and the patient will follow up with Allergy Clinic at [facility]. Vitals are stable at the time of discharge with blood pressure 134/68, saturating 96% on room air.   DISCHARGE MEDICATIONS: Include:  1.  Prednisone 60 mg tapered over 5 days.  2.  Aspirin 81 mg oral once a day.  3.  Atenolol 25 mg oral once a day.  4.  EpiPen injection as needed for severe allergic reaction.  5.  Frova 2.5 mg oral once a day as needed.  6.  Melatonin 3 mg oral once a day at bedtime.  7.  Multivitamin 1 tablet oral once a day.  8.  Omega 3 one capsule oral 2 times a day.  9.  Ventolin HFA 1 puff inhaled 4 times a day as needed.  10.  Vitamin C 500 mg oral once a day.  11.  Zonegran 100 mg two capsules oral 2 times a day.   DISCHARGE INSTRUCTIONS: Follow up with allergy clinic at [facility]. Low salt diet. Activity as tolerated. Use EpiPen for severe allergic reactions. Benadryl p.r.n.   Time spent on day of  discharge: In discharge activity was 35 minutes.    ____________________________ Leia Alf Dequavion Follette, MD srs:th D: 07/16/2012 13:18:09 ET T: 07/17/2012 16:16:55 ET JOB#: [id number]  cc: Alveta Heimlich R. Kristeena Meineke, MD, <Dictator> Neita Carp MD ELECTRONICALLY SIGNED 07/21/2012 13:19

## 2014-10-16 ENCOUNTER — Encounter: Payer: Self-pay | Admitting: Family Medicine

## 2014-11-07 ENCOUNTER — Encounter: Payer: Self-pay | Admitting: Family Medicine

## 2014-11-07 ENCOUNTER — Other Ambulatory Visit: Payer: Self-pay | Admitting: Family Medicine

## 2014-11-07 DIAGNOSIS — Z1283 Encounter for screening for malignant neoplasm of skin: Secondary | ICD-10-CM

## 2014-12-11 DIAGNOSIS — D485 Neoplasm of uncertain behavior of skin: Secondary | ICD-10-CM | POA: Diagnosis not present

## 2014-12-11 DIAGNOSIS — D2272 Melanocytic nevi of left lower limb, including hip: Secondary | ICD-10-CM | POA: Diagnosis not present

## 2014-12-11 DIAGNOSIS — C44612 Basal cell carcinoma of skin of right upper limb, including shoulder: Secondary | ICD-10-CM | POA: Diagnosis not present

## 2014-12-11 DIAGNOSIS — L814 Other melanin hyperpigmentation: Secondary | ICD-10-CM | POA: Diagnosis not present

## 2014-12-11 DIAGNOSIS — D1801 Hemangioma of skin and subcutaneous tissue: Secondary | ICD-10-CM | POA: Diagnosis not present

## 2014-12-11 DIAGNOSIS — D225 Melanocytic nevi of trunk: Secondary | ICD-10-CM | POA: Diagnosis not present

## 2014-12-11 DIAGNOSIS — L821 Other seborrheic keratosis: Secondary | ICD-10-CM | POA: Diagnosis not present

## 2014-12-19 DIAGNOSIS — H40053 Ocular hypertension, bilateral: Secondary | ICD-10-CM | POA: Diagnosis not present

## 2014-12-24 ENCOUNTER — Ambulatory Visit (INDEPENDENT_AMBULATORY_CARE_PROVIDER_SITE_OTHER): Payer: Commercial Managed Care - HMO | Admitting: Family Medicine

## 2014-12-24 ENCOUNTER — Encounter: Payer: Self-pay | Admitting: Family Medicine

## 2014-12-24 VITALS — BP 100/60 | HR 66 | Temp 97.8°F | Ht 65.0 in | Wt 126.8 lb

## 2014-12-24 DIAGNOSIS — Z23 Encounter for immunization: Secondary | ICD-10-CM

## 2014-12-24 DIAGNOSIS — M899 Disorder of bone, unspecified: Secondary | ICD-10-CM

## 2014-12-24 DIAGNOSIS — Z01419 Encounter for gynecological examination (general) (routine) without abnormal findings: Secondary | ICD-10-CM | POA: Diagnosis not present

## 2014-12-24 DIAGNOSIS — I872 Venous insufficiency (chronic) (peripheral): Secondary | ICD-10-CM

## 2014-12-24 DIAGNOSIS — Z0000 Encounter for general adult medical examination without abnormal findings: Secondary | ICD-10-CM | POA: Insufficient documentation

## 2014-12-24 DIAGNOSIS — K5730 Diverticulosis of large intestine without perforation or abscess without bleeding: Secondary | ICD-10-CM

## 2014-12-24 DIAGNOSIS — M949 Disorder of cartilage, unspecified: Secondary | ICD-10-CM

## 2014-12-24 LAB — CBC WITH DIFFERENTIAL/PLATELET
Basophils Absolute: 0 10*3/uL (ref 0.0–0.1)
Basophils Relative: 0.5 % (ref 0.0–3.0)
EOS PCT: 2.5 % (ref 0.0–5.0)
Eosinophils Absolute: 0.2 10*3/uL (ref 0.0–0.7)
HEMATOCRIT: 44.2 % (ref 36.0–46.0)
Hemoglobin: 14.6 g/dL (ref 12.0–15.0)
Lymphocytes Relative: 26 % (ref 12.0–46.0)
Lymphs Abs: 1.9 10*3/uL (ref 0.7–4.0)
MCHC: 33 g/dL (ref 30.0–36.0)
MCV: 96.9 fl (ref 78.0–100.0)
MONOS PCT: 6 % (ref 3.0–12.0)
Monocytes Absolute: 0.4 10*3/uL (ref 0.1–1.0)
NEUTROS ABS: 4.6 10*3/uL (ref 1.4–7.7)
Neutrophils Relative %: 65 % (ref 43.0–77.0)
PLATELETS: 182 10*3/uL (ref 150.0–400.0)
RBC: 4.55 Mil/uL (ref 3.87–5.11)
RDW: 13.4 % (ref 11.5–15.5)
WBC: 7.1 10*3/uL (ref 4.0–10.5)

## 2014-12-24 LAB — LIPID PANEL
CHOL/HDL RATIO: 3
Cholesterol: 188 mg/dL (ref 0–200)
HDL: 56.2 mg/dL (ref >39.00)
LDL CALC: 105 mg/dL — AB (ref 0–99)
NonHDL: 131.8
Triglycerides: 132 mg/dL (ref 0.0–149.0)
VLDL: 26.4 mg/dL (ref 0.0–40.0)

## 2014-12-24 LAB — COMPREHENSIVE METABOLIC PANEL
ALT: 11 U/L (ref 0–35)
AST: 17 U/L (ref 0–37)
Albumin: 4.3 g/dL (ref 3.5–5.2)
Alkaline Phosphatase: 58 U/L (ref 39–117)
BILIRUBIN TOTAL: 0.3 mg/dL (ref 0.2–1.2)
BUN: 29 mg/dL — ABNORMAL HIGH (ref 6–23)
CALCIUM: 9.5 mg/dL (ref 8.4–10.5)
CO2: 30 mEq/L (ref 19–32)
Chloride: 102 mEq/L (ref 96–112)
Creatinine, Ser: 1.3 mg/dL — ABNORMAL HIGH (ref 0.40–1.20)
GFR: 43.26 mL/min — ABNORMAL LOW (ref >60.00)
Glucose, Bld: 108 mg/dL — ABNORMAL HIGH (ref 70–99)
Potassium: 3.9 mEq/L (ref 3.5–5.1)
Sodium: 138 mEq/L (ref 135–145)
Total Protein: 7.4 g/dL (ref 6.0–8.3)

## 2014-12-24 LAB — TSH: TSH: 1.65 u[IU]/mL (ref 0.35–4.50)

## 2014-12-24 NOTE — Patient Instructions (Signed)
Great to see you.   We will call you with your lab results from today and you can see them online.

## 2014-12-24 NOTE — Assessment & Plan Note (Signed)
Due for DEXA next year.

## 2014-12-24 NOTE — Progress Notes (Signed)
Subjective:   Patient ID: Dana Massey, female    DOB: [birth year], 69 y.o.   MRN: [id number]  Dana Massey is a pleasant 69 y.o. year old female who presents to clinic today with Medicare Wellness  and GYN exam,  follow up of chronic medical conditions on 68/04/2015  HPI:  I have personally reviewed the Medicare Annual Wellness questionnaire and have noted 1. The patient's medical and social history 2. Their use of alcohol, tobacco or illicit drugs 3. Their current medications and supplements 4. The patient's functional ability including ADL's, fall risks, home safety risks and hearing or visual             impairment. 5. Diet and physical activities 6. Evidence for depression or mood disorders   End of life wishes discussed and updated in Social History. The roster of all physicians providing medical care to patient - is listed in the Snapshot section of the chart.  Colonoscopy in 10/2007- Dr. Henrene Pastor- 10 year recall.  GYN- Dr. Sabra Heck- Mammogram 08/20/14 DEXA 09/27/13 Pneumovax 11/2011 prevnar 13 12/18/13 Zostavax 12/18/13    Lab Results  Component Value Date   CHOL 211* 12/18/2013   HDL 67.70 12/18/2013   LDLCALC 131* 12/18/2013   LDLDIRECT 126.9 11/25/2012   TRIG 64.0 12/18/2013   CHOLHDL 3 12/18/2013    Current Outpatient Prescriptions on File Prior to Visit  Medication Sig Dispense Refill  . aspirin EC 81 MG tablet Take 81 mg by mouth daily.    [name] atenolol (TENORMIN) 25 MG tablet Take 1 tablet (25 mg total) by mouth daily. 90 tablet 1  . brinzolamide (AZOPT) 1 % ophthalmic suspension Place 1 drop into both eyes 2 (two) times daily.     . Calcium-Vitamin D-Vitamin K (VIACTIV) 500-100-40 MG-UNT-MCG CHEW Chew 1 capsule by mouth daily.      . cycloSPORINE (RESTASIS) 0.05 % ophthalmic emulsion Place 1 drop into both eyes 2 (two) times daily.      [name] EPINEPHrine (EPIPEN 2-PAK IJ) Inject as directed. For anaphylaxis    . fish oil-omega-3 fatty acids 1000 MG capsule Take  2 g by mouth daily.      [name] latanoprost (XALATAN) 0.005 % ophthalmic solution Place 1 drop into both eyes at bedtime.    [name] MELATONIN PO Take 6 mg by mouth at bedtime as needed.     . Multiple Vitamins-Minerals (MULTIVITAMIN WITH MINERALS) tablet Take 1 tablet by mouth daily.      . polyethylene glycol powder (MIRALAX) powder Take 17 g by mouth daily as needed.     . vitamin C (ASCORBIC ACID) 500 MG tablet Take 500 mg by mouth daily.      [name] zonisamide (ZONEGRAN) 100 MG capsule Take 200mg  twice daily     No current facility-administered medications on file prior to visit.    Allergies  Allergen Reactions  . Wasp Venom Anaphylaxis    Has EpiPen    Past Medical History  Diagnosis Date  . Glaucoma   . Mitral valve prolapse   . Palpitations   . Venous insufficiency   . Diverticulosis of colon   . IBS (irritable bowel syndrome)   . Shoulder pain   . Osteopenia   . Migraine headache   . Scoliosis   . Toxic effect of venom(989.5)     Past Surgical History  Procedure Laterality Date  . Back surgery  1994    scoliosis at [facility] spine [facility]  . Rotator cuff repair    .  Cataract extraction  11/11    left    Family History  Problem Relation Age of Onset  . Diabetes Mother     History   Social History  . Marital Status: Married    Spouse Name: [name]  . Number of Children: 2  . Years of Education: N/A   Occupational History  . Not on file.   Social History Main Topics  . Smoking status: Never Smoker   . Smokeless tobacco: Never Used  . Alcohol Use: No  . Drug Use: No  . Sexual Activity:    Partners: Male     Comment: husband vasectomy   Other Topics Concern  . Not on file   Social History Narrative   Does not have a living will.   Desires CPR but would not want prolonged life support.         The PMH, PSH, Social History, Family History, Medications, and allergies have been reviewed in [facility], and have been updated if relevant.   Review of Systems    Constitutional: Negative.   HENT: Negative.   Respiratory: Negative.   Cardiovascular: Negative.   Gastrointestinal: Negative.   Endocrine: Negative.   Genitourinary: Negative.   Musculoskeletal: Negative.   Skin: Negative.   Allergic/Immunologic: Negative.   Neurological: Negative.   Hematological: Negative.   Psychiatric/Behavioral: Negative.   All other systems reviewed and are negative.      Objective:    BP 100/60 mmHg  Pulse 66  Temp(Src) 97.8 F (36.6 C) (Oral)  Ht 5\' 5"  (1.651 m)  Wt 126 lb 12.8 oz (57.516 kg)  BMI 21.10 kg/m2  LMP 09/13/2000  BP Readings from Last 3 Encounters:  12/24/14 100/60  06/04/14 100/78  05/15/14 124/74    Physical Exam  General:  Well-developed,well-nourished,in no acute distress; alert,appropriate and cooperative throughout examination Head:  normocephalic and atraumatic.   Eyes:  vision grossly intact, pupils equal, pupils round, and pupils reactive to light.   Ears:  R ear normal and L ear normal.   Nose:  no external deformity.   Mouth:  good dentition.   Neck:  No deformities, masses, or tenderness noted. Lungs:  Normal respiratory effort, chest expands symmetrically. Lungs are clear to auscultation, no crackles or wheezes. Heart:  Normal rate and regular rhythm. S1 and S2 normal without gallop, murmur, click, rub or other extra sounds. Abdomen:  Bowel sounds positive,abdomen soft and non-tender without masses, organomegaly or hernias noted. osis noted of thoracic or lumbar spine.   Extremities:  No clubbing, cyanosis, or deformity noted with normal full range of motion of all joints.  Left lower leg 1+ edema (chronic).  Neurologic:  alert & oriented X3 and gait normal.   Skin:  Intact without suspicious lesions or rashes Cervical Nodes:  No lymphadenopathy noted Axillary Nodes:  No palpable lymphadenopathy Psych:  Cognition and judgment appear intact. Alert and cooperative with normal attention span and concentration. No  apparent   Rectal:  no external abnormalities.   Genitalia:  Pelvic Exam:        External: normal female genitalia without lesions or masses        Vagina: normal without lesions or masses        Cervix: normal without lesions or masses        Adnexa: normal bimanual exam without masses or fullness        Uterus: normal by palpation            Assessment & Plan:  Medicare annual wellness visit, subsequent No Follow-up on file.

## 2014-12-24 NOTE — Addendum Note (Signed)
Addended byCloyd Stagers B on: 12/24/2014 02:45 PM   Modules accepted: Orders

## 2014-12-24 NOTE — Assessment & Plan Note (Signed)
The patients weight, height, BMI and visual acuity have been recorded in the chart.  Cognitive function assessed.   I have made referrals, counseling and provided education to the patient based review of the above and I have provided the pt with a written personalized care plan for preventive services.  Td today.  Orders Placed This Encounter  Procedures  . CBC with Differential/Platelet  . Comprehensive metabolic panel  . Lipid panel  . TSH

## 2014-12-24 NOTE — Assessment & Plan Note (Signed)
Colonoscopy UTD.  Denies changes in her bowel habits.

## 2014-12-24 NOTE — Progress Notes (Signed)
Pre visit review using our clinic review tool, if applicable. No additional management support is needed unless otherwise documented below in the visit note. 

## 2014-12-24 NOTE — Assessment & Plan Note (Signed)
BME done today.

## 2014-12-31 ENCOUNTER — Other Ambulatory Visit: Payer: Self-pay | Admitting: Family Medicine

## 2014-12-31 DIAGNOSIS — R7989 Other specified abnormal findings of blood chemistry: Secondary | ICD-10-CM

## 2015-01-02 ENCOUNTER — Encounter: Payer: Self-pay | Admitting: Family Medicine

## 2015-01-02 DIAGNOSIS — Z85828 Personal history of other malignant neoplasm of skin: Secondary | ICD-10-CM | POA: Diagnosis not present

## 2015-01-02 DIAGNOSIS — Z08 Encounter for follow-up examination after completed treatment for malignant neoplasm: Secondary | ICD-10-CM | POA: Diagnosis not present

## 2015-01-02 DIAGNOSIS — L905 Scar conditions and fibrosis of skin: Secondary | ICD-10-CM | POA: Diagnosis not present

## 2015-01-03 ENCOUNTER — Other Ambulatory Visit (INDEPENDENT_AMBULATORY_CARE_PROVIDER_SITE_OTHER): Payer: Commercial Managed Care - HMO

## 2015-01-03 DIAGNOSIS — N289 Disorder of kidney and ureter, unspecified: Secondary | ICD-10-CM | POA: Diagnosis not present

## 2015-01-03 LAB — BASIC METABOLIC PANEL
BUN: 19 mg/dL (ref 6–23)
CALCIUM: 9.5 mg/dL (ref 8.4–10.5)
CO2: 29 meq/L (ref 19–32)
CREATININE: 1.19 mg/dL (ref 0.40–1.20)
Chloride: 103 mEq/L (ref 96–112)
GFR: 47.9 mL/min — ABNORMAL LOW (ref >60.00)
Glucose, Bld: 94 mg/dL (ref 70–99)
POTASSIUM: 4.6 meq/L (ref 3.5–5.1)
Sodium: 138 mEq/L (ref 135–145)

## 2015-01-04 ENCOUNTER — Encounter: Payer: Self-pay | Admitting: *Deleted

## 2015-01-07 ENCOUNTER — Encounter: Payer: Self-pay | Admitting: Family Medicine

## 2015-02-12 DIAGNOSIS — Z08 Encounter for follow-up examination after completed treatment for malignant neoplasm: Secondary | ICD-10-CM | POA: Diagnosis not present

## 2015-02-12 DIAGNOSIS — Z85828 Personal history of other malignant neoplasm of skin: Secondary | ICD-10-CM | POA: Diagnosis not present

## 2015-02-12 DIAGNOSIS — L905 Scar conditions and fibrosis of skin: Secondary | ICD-10-CM | POA: Diagnosis not present

## 2015-03-07 ENCOUNTER — Other Ambulatory Visit: Payer: Self-pay | Admitting: Family Medicine

## 2015-03-07 ENCOUNTER — Encounter: Payer: Self-pay | Admitting: Family Medicine

## 2015-03-07 DIAGNOSIS — G43819 Other migraine, intractable, without status migrainosus: Secondary | ICD-10-CM

## 2015-03-25 ENCOUNTER — Encounter: Payer: Self-pay | Admitting: Family Medicine

## 2015-03-26 MED ORDER — ATENOLOL 25 MG PO TABS: 25.0000 mg | ORAL_TABLET | Freq: Every day | ORAL | Status: DC

## 2015-03-28 ENCOUNTER — Ambulatory Visit (INDEPENDENT_AMBULATORY_CARE_PROVIDER_SITE_OTHER): Payer: Commercial Managed Care - HMO

## 2015-03-28 DIAGNOSIS — Z23 Encounter for immunization: Secondary | ICD-10-CM

## 2015-04-16 DIAGNOSIS — G43019 Migraine without aura, intractable, without status migrainosus: Secondary | ICD-10-CM | POA: Diagnosis not present

## 2015-04-16 DIAGNOSIS — G43719 Chronic migraine without aura, intractable, without status migrainosus: Secondary | ICD-10-CM | POA: Diagnosis not present

## 2015-05-21 DIAGNOSIS — H40053 Ocular hypertension, bilateral: Secondary | ICD-10-CM | POA: Diagnosis not present

## 2015-07-28 ENCOUNTER — Encounter: Payer: Self-pay | Admitting: Family Medicine

## 2015-07-29 ENCOUNTER — Other Ambulatory Visit: Payer: Self-pay | Admitting: Family Medicine

## 2015-07-29 DIAGNOSIS — C4491 Basal cell carcinoma of skin, unspecified: Secondary | ICD-10-CM

## 2015-08-05 DIAGNOSIS — Z85828 Personal history of other malignant neoplasm of skin: Secondary | ICD-10-CM | POA: Diagnosis not present

## 2015-08-05 DIAGNOSIS — L812 Freckles: Secondary | ICD-10-CM | POA: Diagnosis not present

## 2015-08-05 DIAGNOSIS — D225 Melanocytic nevi of trunk: Secondary | ICD-10-CM | POA: Diagnosis not present

## 2015-08-05 DIAGNOSIS — L821 Other seborrheic keratosis: Secondary | ICD-10-CM | POA: Diagnosis not present

## 2015-08-05 DIAGNOSIS — L570 Actinic keratosis: Secondary | ICD-10-CM | POA: Diagnosis not present

## 2015-08-06 ENCOUNTER — Other Ambulatory Visit: Payer: Self-pay | Admitting: Family Medicine

## 2015-08-06 ENCOUNTER — Encounter: Payer: Self-pay | Admitting: Family Medicine

## 2015-08-06 DIAGNOSIS — G43809 Other migraine, not intractable, without status migrainosus: Secondary | ICD-10-CM

## 2015-08-27 DIAGNOSIS — Z1231 Encounter for screening mammogram for malignant neoplasm of breast: Secondary | ICD-10-CM | POA: Diagnosis not present

## 2015-09-04 ENCOUNTER — Encounter: Payer: Self-pay | Admitting: Family Medicine

## 2015-09-18 DIAGNOSIS — H5210 Myopia, unspecified eye: Secondary | ICD-10-CM | POA: Diagnosis not present

## 2015-10-23 DIAGNOSIS — M8589 Other specified disorders of bone density and structure, multiple sites: Secondary | ICD-10-CM | POA: Diagnosis not present

## 2015-10-28 ENCOUNTER — Encounter: Payer: Self-pay | Admitting: Family Medicine

## 2015-10-29 ENCOUNTER — Encounter: Payer: Self-pay | Admitting: Family Medicine

## 2015-10-29 ENCOUNTER — Encounter: Payer: Self-pay | Admitting: *Deleted

## 2015-10-31 DIAGNOSIS — G43719 Chronic migraine without aura, intractable, without status migrainosus: Secondary | ICD-10-CM | POA: Diagnosis not present

## 2015-10-31 DIAGNOSIS — G43019 Migraine without aura, intractable, without status migrainosus: Secondary | ICD-10-CM | POA: Diagnosis not present

## 2015-12-25 ENCOUNTER — Ambulatory Visit (INDEPENDENT_AMBULATORY_CARE_PROVIDER_SITE_OTHER): Payer: Commercial Managed Care - HMO

## 2015-12-25 ENCOUNTER — Encounter: Payer: Self-pay | Admitting: Family Medicine

## 2015-12-25 ENCOUNTER — Ambulatory Visit (INDEPENDENT_AMBULATORY_CARE_PROVIDER_SITE_OTHER): Payer: Commercial Managed Care - HMO | Admitting: Family Medicine

## 2015-12-25 VITALS — BP 110/74 | HR 66 | Temp 98.2°F | Ht 64.0 in | Wt 126.5 lb

## 2015-12-25 DIAGNOSIS — Z1159 Encounter for screening for other viral diseases: Secondary | ICD-10-CM

## 2015-12-25 DIAGNOSIS — Z01419 Encounter for gynecological examination (general) (routine) without abnormal findings: Secondary | ICD-10-CM

## 2015-12-25 DIAGNOSIS — R94120 Abnormal auditory function study: Secondary | ICD-10-CM | POA: Diagnosis not present

## 2015-12-25 DIAGNOSIS — Z0001 Encounter for general adult medical examination with abnormal findings: Secondary | ICD-10-CM

## 2015-12-25 DIAGNOSIS — F329 Major depressive disorder, single episode, unspecified: Secondary | ICD-10-CM | POA: Diagnosis not present

## 2015-12-25 DIAGNOSIS — Z0000 Encounter for general adult medical examination without abnormal findings: Secondary | ICD-10-CM

## 2015-12-25 DIAGNOSIS — G43901 Migraine, unspecified, not intractable, with status migrainosus: Secondary | ICD-10-CM

## 2015-12-25 DIAGNOSIS — F32A Depression, unspecified: Secondary | ICD-10-CM

## 2015-12-25 DIAGNOSIS — F331 Major depressive disorder, recurrent, moderate: Secondary | ICD-10-CM | POA: Insufficient documentation

## 2015-12-25 LAB — COMPREHENSIVE METABOLIC PANEL
ALBUMIN: 4.5 g/dL (ref 3.5–5.2)
ALT: 14 U/L (ref 0–35)
AST: 20 U/L (ref 0–37)
Alkaline Phosphatase: 51 U/L (ref 39–117)
BUN: 25 mg/dL — AB (ref 6–23)
CO2: 29 mEq/L (ref 19–32)
CREATININE: 1.13 mg/dL (ref 0.40–1.20)
Calcium: 10.2 mg/dL (ref 8.4–10.5)
Chloride: 108 mEq/L (ref 96–112)
GFR: 50.7 mL/min — ABNORMAL LOW (ref >60.00)
GLUCOSE: 103 mg/dL — AB (ref 70–99)
POTASSIUM: 4.4 meq/L (ref 3.5–5.1)
SODIUM: 133 meq/L — AB (ref 135–145)
TOTAL PROTEIN: 7.7 g/dL (ref 6.0–8.3)
Total Bilirubin: 0.3 mg/dL (ref 0.2–1.2)

## 2015-12-25 LAB — CBC WITH DIFFERENTIAL/PLATELET
Basophils Absolute: 0 10*3/uL (ref 0.0–0.1)
Basophils Relative: 0.8 % (ref 0.0–3.0)
EOS PCT: 2.2 % (ref 0.0–5.0)
Eosinophils Absolute: 0.1 10*3/uL (ref 0.0–0.7)
HEMATOCRIT: 44.7 % (ref 36.0–46.0)
HEMOGLOBIN: 14.9 g/dL (ref 12.0–15.0)
LYMPHS PCT: 28.7 % (ref 12.0–46.0)
Lymphs Abs: 1.6 10*3/uL (ref 0.7–4.0)
MCHC: 33.3 g/dL (ref 30.0–36.0)
MCV: 96.5 fl (ref 78.0–100.0)
MONO ABS: 0.4 10*3/uL (ref 0.1–1.0)
MONOS PCT: 7.2 % (ref 3.0–12.0)
Neutro Abs: 3.5 10*3/uL (ref 1.4–7.7)
Neutrophils Relative %: 61.1 % (ref 43.0–77.0)
Platelets: 178 10*3/uL (ref 150.0–400.0)
RBC: 4.63 Mil/uL (ref 3.87–5.11)
RDW: 13.7 % (ref 11.5–15.5)
WBC: 5.7 10*3/uL (ref 4.0–10.5)

## 2015-12-25 LAB — LIPID PANEL
CHOLESTEROL: 221 mg/dL — AB (ref 0–200)
HDL: 68.7 mg/dL (ref >39.00)
LDL CALC: 138 mg/dL — AB (ref 0–99)
NONHDL: 151.86
Total CHOL/HDL Ratio: 3
Triglycerides: 67 mg/dL (ref 0.0–149.0)
VLDL: 13.4 mg/dL (ref 0.0–40.0)

## 2015-12-25 LAB — TSH: TSH: 1.12 u[IU]/mL (ref 0.35–4.50)

## 2015-12-25 MED ORDER — ATENOLOL 25 MG PO TABS: 25.0000 mg | ORAL_TABLET | Freq: Every day | ORAL | Status: DC

## 2015-12-25 MED ORDER — SERTRALINE HCL 50 MG PO TABS: 50.0000 mg | ORAL_TABLET | Freq: Every day | ORAL | Status: DC

## 2015-12-25 NOTE — Progress Notes (Signed)
Pre visit review using our clinic review tool, if applicable. No additional management support is needed unless otherwise documented below in the visit note. 

## 2015-12-25 NOTE — Assessment & Plan Note (Addendum)
New- >25 minutes spent in face to face time with patient above and beyond the time spent for the CPX, >50% spent in counselling or coordination of care  Start Zoloft 50 mg. Patient is to take 1/2 tablet daily for 10 days, then advance to 1 full tablet thereafter. We discussed possible side effects of headache, GI upset, drowsiness, and SI/HI. If thoughts of SI/HI develop, we discussed to present to the emergency immediately. Patient verbalized understanding.

## 2015-12-25 NOTE — Progress Notes (Signed)
Subjective:   Dana Massey is a 70 y.o. female who presents for Medicare Annual (Subsequent) preventive examination.  Review of Systems:  N/A Cardiac Risk Factors include: advanced age (>68men, >62 women)     Objective:     Vitals: BP 110/74 mmHg  Pulse 66  Temp(Src) 98.2 F (36.8 C) (Oral)  Ht 5\' 4"  (1.626 m)  Wt 126 lb 8 oz (57.38 kg)  BMI 21.70 kg/m2  SpO2 97%  LMP 09/13/2000  Body mass index is 21.7 kg/(m^2).   Tobacco History  Smoking status  . Never Smoker   Smokeless tobacco  . Never Used     Counseling given: No   Past Medical History  Diagnosis Date  . Glaucoma   . Mitral valve prolapse   . Palpitations   . Venous insufficiency   . Diverticulosis of colon   . IBS (irritable bowel syndrome)   . Shoulder pain   . Osteopenia   . Migraine headache   . Scoliosis   . Toxic effect of venom(989.5)    Past Surgical History  Procedure Laterality Date  . Back surgery  1994    scoliosis at [facility] spine center  . Rotator cuff repair    . Cataract extraction  11/11    left   Family History  Problem Relation Age of Onset  . Diabetes Mother    History  Sexual Activity  . Sexual Activity:  . Partners: Male    Comment: husband vasectomy    Outpatient Encounter Prescriptions as of 12/25/2015  Medication Sig  . aspirin EC 81 MG tablet Take 81 mg by mouth daily.  [name] atenolol (TENORMIN) 25 MG tablet Take 1 tablet (25 mg total) by mouth daily.  . brinzolamide (AZOPT) 1 % ophthalmic suspension Place 1 drop into both eyes 2 (two) times daily.   . Calcium-Vitamin D-Vitamin K (VIACTIV) 500-100-40 MG-UNT-MCG CHEW Chew 1 capsule by mouth daily.    . cycloSPORINE (RESTASIS) 0.05 % ophthalmic emulsion Place 1 drop into both eyes 2 (two) times daily.    . fish oil-omega-3 fatty acids 1000 MG capsule Take 2 g by mouth daily.    [name] latanoprost (XALATAN) 0.005 % ophthalmic solution Place 1 drop into both eyes at bedtime.  [name] MELATONIN PO Take 6 mg by mouth at bedtime  as needed.   . Multiple Vitamins-Minerals (MULTIVITAMIN WITH MINERALS) tablet Take 1 tablet by mouth daily.    . polyethylene glycol powder (MIRALAX) powder Take 17 g by mouth daily as needed.   . vitamin C (ASCORBIC ACID) 500 MG tablet Take 500 mg by mouth daily.    [name] zonisamide (ZONEGRAN) 100 MG capsule Take 300 mg by mouth daily. Take 300 mg once daily  . [DISCONTINUED] EPINEPHrine (EPIPEN 2-PAK IJ) Inject as directed. For anaphylaxis   No facility-administered encounter medications on file as of 12/25/2015.    Activities of Daily Living In your present state of health, do you have any difficulty performing the following activities: 12/25/2015  Hearing? N  Vision? N  Difficulty concentrating or making decisions? N  Walking or climbing stairs? N  Dressing or bathing? N  Doing errands, shopping? N  Preparing Food and eating ? N  Using the Toilet? N  In the past six months, have you accidently leaked urine? N  Do you have problems with loss of bowel control? N  Managing your Medications? N  Managing your Finances? N  Housekeeping or managing your Housekeeping? N    Patient Care Team:  Lucille Passy, MD as PCP - General (Family Medicine) Megan Salon, MD as Consulting Physician (Gynecology) Evans Lance, MD as Consulting Physician (Cardiology) Macarthur Critchley, [place] as Referring Physician (Optometry) Druscilla Brownie, MD as Consulting Physician (Dermatology) Orie Rout, MD as Referring Physician (Specialist) Glennie Isle, PA-C as Referring Physician (Physician Assistant) Earley Favor, DDS as Referring Physician (Dentistry)    Assessment:     Hearing Screening   125Hz  250Hz  500Hz  1000Hz  2000Hz  4000Hz  8000Hz   Right ear:   0 40 40 40   Left ear:   0 40 40 0   Vision Screening Comments: Last vision exam in April 2017 with Dr. Nicki Reaper  . Exercise Activities and Dietary recommendations Current Exercise Habits: Structured exercise class, Type of exercise: strength training/weights,  Time (Minutes): 45, Frequency (Times/Week): 2, Weekly Exercise (Minutes/Week): 90, Intensity: Moderate, Exercise limited by: None identified  Goals    . Increase physical activity     Starting 12/25/2015, I will continue to exercise for at least 45 min 2 days per week.       Fall Risk Fall Risk  12/25/2015 12/25/2015 12/24/2014 11/25/2012  Falls in the past year? No No No No   Depression Screen PHQ 2/9 Scores 12/25/2015 12/25/2015 12/24/2014 11/25/2012  PHQ - 2 Score 6 6 0 0  PHQ- 9 Score 16 16 - -     Cognitive Testing MMSE - Mini Mental State Exam 12/25/2015  Orientation to time 5  Orientation to Place 5  Registration 3  Attention/ Calculation 0  Recall 3  Language- name 2 objects 0  Language- repeat 1  Language- follow 3 step command 3  Language- read & follow direction 0  Write a sentence 0  Copy design 0  Total score 20   PLEASE NOTE: A Mini-Cog screen was completed. Maximum score is 20. A value of 0 denotes this part of Folstein MMSE was not completed or the patient failed this part of the Mini-Cog screening.   Mini-Cog Screening Orientation to Time - Max 5 pts Orientation to Place - Max 5 pts Registration - Max 3 pts Recall - Max 3 pts Language Repeat - Max 1 pts Language Follow 3 Step Command - Max 3 pts   Immunization History  Administered Date(s) Administered  . Influenza Split 05/26/2011, 03/22/2012  . Influenza Whole 03/07/2008, 03/29/2009, 03/14/2010  . Influenza,inj,Quad PF,36+ Mos 03/21/2013, 02/21/2014, 03/28/2015  . Pneumococcal Conjugate-13 12/18/2013  . Pneumococcal Polysaccharide-23 11/20/2011  . Td 12/24/2014  . Zoster 12/18/2013   Screening Tests Health Maintenance  Topic Date Due  . INFLUENZA VACCINE  01/14/2016  . MAMMOGRAM  08/26/2017  . COLONOSCOPY  10/20/2017  . TETANUS/TDAP  12/23/2024  . DEXA SCAN  Completed  . ZOSTAVAX  Completed  . Hepatitis C Screening  Completed  . PNA vac Low Risk Adult  Completed      Plan:     I have  personally reviewed and addressed the Medicare Annual Wellness questionnaire and have noted the following in the patient's chart:  A. Medical and social history B. Use of alcohol, tobacco or illicit drugs  C. Current medications and supplements D. Functional ability and status E.  Nutritional status F.  Physical activity G. Advance directives H. List of other physicians I.  Hospitalizations, surgeries, and ER visits in previous 12 months J.  [place] to include hearing, vision, cognitive, depression L. Referrals and appointments - none  In addition, I have reviewed and discussed with patient certain preventive protocols,  quality metrics, and best practice recommendations. A written personalized care plan for preventive services as well as general preventive health recommendations were provided to patient.  See attached scanned questionnaire for additional information.   Signed,   Lindell Noe, MHA, BS, LPN Health Advisor

## 2015-12-25 NOTE — Assessment & Plan Note (Addendum)
Deteriorated. Atenolol refilled.  Advised to follow up with Dr. Domingo Cocking since he has been managing her headaches. The patient indicates understanding of these issues and agrees with the plan.

## 2015-12-25 NOTE — Patient Instructions (Addendum)
We are going to start Zoloft 50 mg. Take 1/2 tablet daily for 10 days, then advance to 1 full tablet thereafter. Possible side effects include headache, GI upset, drowsiness, and SI/HI.  Please update me in a few weeks.  Also let me know if you want a referral to see a therapist.  We are referring you to an audiologist.

## 2015-12-25 NOTE — Assessment & Plan Note (Signed)
Reviewed preventive care protocols, scheduled due services, and updated immunizations Discussed nutrition, exercise, diet, and healthy lifestyle.  

## 2015-12-25 NOTE — Patient Instructions (Signed)
Dana Massey , Thank you for taking time to come for your Medicare Wellness Visit. I appreciate your ongoing commitment to your health goals. Please review the following plan we discussed and let me know if I can assist you in the future.   These are the goals we discussed: Goals    . Increase physical activity     Starting 12/25/2015, I will continue to exercise for at least 45 min 2 days per week.        This is a list of the screening recommended for you and due dates:  Health Maintenance  Topic Date Due  .  Hepatitis C: One time screening is recommended by Center for Disease Control  (CDC) for  adults born from [birth year] through 1964.   Completed  . Flu Shot  01/14/2016  . Mammogram  08/26/2017  . Colon Cancer Screening  10/20/2017  . Tetanus Vaccine  12/23/2024  . DEXA scan (bone density measurement)  Completed  . Shingles Vaccine  Completed  . Pneumonia vaccines  Completed    Preventive Care for Adults  A healthy lifestyle and preventive care can promote health and wellness. Preventive health guidelines for adults include the following key practices.  . A routine yearly physical is a good way to check with your health care provider about your health and preventive screening. It is a chance to share any concerns and updates on your health and to receive a thorough exam.  . Visit your dentist for a routine exam and preventive care every 6 months. Brush your teeth twice a day and floss once a day. Good oral hygiene prevents tooth decay and gum disease.  . The frequency of eye exams is based on your age, health, family medical history, use  of contact lenses, and other factors. Follow your health care provider's ecommendations for frequency of eye exams.  . Eat a healthy diet. Foods like vegetables, fruits, whole grains, low-fat dairy products, and lean protein foods contain the nutrients you need without too many calories. Decrease your intake of foods high in solid fats, added  sugars, and salt. Eat the right amount of calories for you. Get information about a proper diet from your health care provider, if necessary.  . Regular physical exercise is one of the most important things you can do for your health. Most adults should get at least 150 minutes of moderate-intensity exercise (any activity that increases your heart rate and causes you to sweat) each week. In addition, most adults need muscle-strengthening exercises on 2 or more days a week.  Silver Sneakers may be a benefit available to you. To determine eligibility, you may visit the website: www.silversneakers.com or contact program at 681-073-2963 Mon-Fri between 8AM-8PM.   . Maintain a healthy weight. The body mass index (BMI) is a screening tool to identify possible weight problems. It provides an estimate of body fat based on height and weight. Your health care provider can find your BMI and can help you achieve or maintain a healthy weight.   For adults 20 years and older: ? A BMI below 18.5 is considered underweight. ? A BMI of 18.5 to 24.9 is normal. ? A BMI of 25 to 29.9 is considered overweight. ? A BMI of 30 and above is considered obese.   . Maintain normal blood lipids and cholesterol levels by exercising and minimizing your intake of saturated fat. Eat a balanced diet with plenty of fruit and vegetables. Blood tests for lipids and cholesterol should begin  at age 68 and be repeated every 5 years. If your lipid or cholesterol levels are high, you are over 50, or you are at high risk for heart disease, you may need your cholesterol levels checked more frequently. Ongoing high lipid and cholesterol levels should be treated with medicines if diet and exercise are not working.  . If you smoke, find out from your health care provider how to quit. If you do not use tobacco, please do not start.  . If you choose to drink alcohol, please do not consume more than 2 drinks per day. One drink is considered to  be 12 ounces (355 mL) of beer, 5 ounces (148 mL) of wine, or 1.5 ounces (44 mL) of liquor.  . If you are 35-24 years old, ask your health care provider if you should take aspirin to prevent strokes.  . Use sunscreen. Apply sunscreen liberally and repeatedly throughout the day. You should seek shade when your shadow is shorter than you. Protect yourself by wearing long sleeves, pants, a wide-brimmed hat, and sunglasses year round, whenever you are outdoors.  . Once a month, do a whole body skin exam, using a mirror to look at the skin on your back. Tell your health care provider of new moles, moles that have irregular borders, moles that are larger than a pencil eraser, or moles that have changed in shape or color.

## 2015-12-25 NOTE — Progress Notes (Signed)
PCP notes:  Health maintenance:   Hep C screening - completed  Abnormal screenings:  Hearing - failed Depression - PHQ2 and PHQ9 completed  Patient concerns: Needs refill for Atenolol. Increased frequency of migraines. Feelings of depression. PCP notified.  Nurse concerns: None  Next PCP appt: 12/25/15 @ 1215

## 2015-12-25 NOTE — Progress Notes (Signed)
Subjective:   Patient ID: Dana Massey, female    DOB: 06/17/1945, 70 y.o.   MRN: [id number]  Dana Massey is a pleasant 70 y.o. year old female who presents to clinic today with Annual Exam; Depression; and Follow-up  and follow up of chronic medical conditions on 12/25/2015  HPI:  Had annual medicare wellness visit with [name], RN this morning. Notes reviewed.  Scored 16 on PHQ9 this morning- increased symptoms of depression. Ongoing for months. Tearful, "just sad," irritable, not sleeping well.  Mom died a few months ago. Appetite ok.  No SI or HI. Sister and brother take antidepressants.  Migraines have also worsened lately- had over 15 migraines in the past month. Was taking atenolol (started for palpitations) as preventative therapy, zonegran- followed by Dr. Domingo Cocking. She feels some of her headaches are tension headache.  Also failed her hearing screen.     Lab Results  Component Value Date   CHOL 188 12/24/2014   HDL 56.20 12/24/2014   LDLCALC 105* 12/24/2014   LDLDIRECT 126.9 11/25/2012   TRIG 132.0 12/24/2014   CHOLHDL 3 12/24/2014   Lab Results  Component Value Date   NA 138 01/03/2015   K 4.6 01/03/2015   CL 103 01/03/2015   CO2 29 01/03/2015   Lab Results  Component Value Date   WBC 7.1 12/24/2014   HGB 14.6 12/24/2014   HCT 44.2 12/24/2014   MCV 96.9 12/24/2014   PLT 182.0 12/24/2014   Lab Results  Component Value Date   TSH 1.65 12/24/2014    Current Outpatient Prescriptions on File Prior to Visit  Medication Sig Dispense Refill  . aspirin EC 81 MG tablet Take 81 mg by mouth daily.    [name] atenolol (TENORMIN) 25 MG tablet Take 1 tablet (25 mg total) by mouth daily. 90 tablet 2  . brinzolamide (AZOPT) 1 % ophthalmic suspension Place 1 drop into both eyes 2 (two) times daily.     . Calcium-Vitamin D-Vitamin K (VIACTIV) 500-100-40 MG-UNT-MCG CHEW Chew 1 capsule by mouth daily.      . cycloSPORINE (RESTASIS) 0.05 % ophthalmic  emulsion Place 1 drop into both eyes 2 (two) times daily.      . fish oil-omega-3 fatty acids 1000 MG capsule Take 2 g by mouth daily.      [name] latanoprost (XALATAN) 0.005 % ophthalmic solution Place 1 drop into both eyes at bedtime.    [name] MELATONIN PO Take 6 mg by mouth at bedtime as needed.     . Multiple Vitamins-Minerals (MULTIVITAMIN WITH MINERALS) tablet Take 1 tablet by mouth daily.      . polyethylene glycol powder (MIRALAX) powder Take 17 g by mouth daily as needed.     . vitamin C (ASCORBIC ACID) 500 MG tablet Take 500 mg by mouth daily.      [name] zonisamide (ZONEGRAN) 100 MG capsule Take 300 mg by mouth daily. Take 300 mg once daily     No current facility-administered medications on file prior to visit.    Allergies  Allergen Reactions  . Wasp Venom Anaphylaxis    Has EpiPen    Past Medical History  Diagnosis Date  . Glaucoma   . Mitral valve prolapse   . Palpitations   . Venous insufficiency   . Diverticulosis of colon   . IBS (irritable bowel syndrome)   . Shoulder pain   . Osteopenia   . Migraine headache   . Scoliosis   . 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 Back surgery  1994    scoliosis at [facility]  . Rotator cuff repair    . Cataract extraction  11/11    left    Family History  Problem Relation Age of Onset  . Diabetes Mother     Social History   Social History  . Marital Status: Married    Spouse Name: [name]  . Number of Children: 2  . Years of Education: N/A   Occupational History  . Not on file.   Social History Main Topics  . Smoking status: Never Smoker   . Smokeless tobacco: Never Used  . Alcohol Use: No  . Drug Use: No  . Sexual Activity:    Partners: Male     Comment: husband vasectomy   Other Topics Concern  . Not on file   Social History Narrative   Does not have a living will.   Desires CPR but would not want prolonged life support.         The PMH, PSH, Social  History, Family History, Medications, and allergies have been reviewed in [facility], and have been updated if relevant.   Review of Systems  Constitutional: Negative.   HENT: Negative.   Respiratory: Negative.   Cardiovascular: Negative.   Gastrointestinal: Negative.   Endocrine: Negative.   Genitourinary: Negative.   Musculoskeletal: Negative.   Skin: Negative.   Allergic/Immunologic: Negative.   Neurological: Negative.   Hematological: Negative.   Psychiatric/Behavioral: Negative.   All other systems reviewed and are negative.      Objective:    BP 110/74 mmHg  Pulse 66  Temp(Src) 98.2 F (36.8 C) (Oral)  Ht 5\' 4"  (1.626 m)  Wt 126 lb 8 oz (57.38 kg)  BMI 21.70 kg/m2  SpO2 97%  LMP 09/13/2000  BP Readings from Last 3 Encounters:  12/25/15 110/74  12/25/15 110/74  12/24/14 100/60   Wt Readings from Last 3 Encounters:  12/25/15 126 lb 8 oz (57.38 kg)  12/25/15 126 lb 8 oz (57.38 kg)  12/24/14 126 lb 12.8 oz (57.516 kg)    Physical Exam  General:  Well-developed,well-nourished,in no acute distress; alert,appropriate and cooperative throughout examination Head:  normocephalic and atraumatic.   Eyes:  vision grossly intact, pupils equal, pupils round, and pupils reactive to light.   Ears:  R ear normal and L ear normal.   Nose:  no external deformity.   Mouth:  good dentition.   Neck:  No deformities, masses, or tenderness noted. Lungs:  Normal respiratory effort, chest expands symmetrically. Lungs are clear to auscultation, no crackles or wheezes. Heart:  Normal rate and regular rhythm. S1 and S2 normal without gallop, murmur, click, rub or other extra sounds. Abdomen:  Bowel sounds positive,abdomen soft and non-tender without masses, organomegaly or hernias noted. osis noted of thoracic or lumbar spine.   Extremities:  No clubbing, cyanosis, or deformity noted with normal full range of motion of all joints.  Left lower leg 1+ edema (chronic).  Neurologic:  alert &  oriented X3 and gait normal.   Skin:  Intact without suspicious lesions or rashes Cervical Nodes:  No lymphadenopathy noted Axillary Nodes:  No palpable lymphadenopathy Psych:  +flat affect, appears depressed     Assessment & Plan:   Well woman exam  Depression  Failed hearing screening  Migraine with status migrainosus, not intractable, unspecified migraine type No Follow-up on file.

## 2015-12-25 NOTE — Assessment & Plan Note (Signed)
Refer to audiology.

## 2015-12-25 NOTE — Progress Notes (Signed)
I reviewed health advisor's note, was available for consultation, and agree with documentation and plan.  

## 2015-12-26 LAB — HEPATITIS C ANTIBODY: HCV Ab: NEGATIVE

## 2015-12-26 NOTE — Addendum Note (Signed)
Addended by: Lucille Passy on: 12/26/2015 03:33 PM   Modules accepted: Miquel Dunn

## 2016-01-20 ENCOUNTER — Encounter: Payer: Self-pay | Admitting: Family Medicine

## 2016-01-22 DIAGNOSIS — H903 Sensorineural hearing loss, bilateral: Secondary | ICD-10-CM | POA: Diagnosis not present

## 2016-01-28 ENCOUNTER — Other Ambulatory Visit: Payer: Self-pay | Admitting: Family Medicine

## 2016-01-28 MED ORDER — SERTRALINE HCL 50 MG PO TABS: 75.0000 mg | ORAL_TABLET | Freq: Every day | ORAL | 3 refills | Status: DC

## 2016-02-27 ENCOUNTER — Encounter: Payer: Self-pay | Admitting: Family Medicine

## 2016-02-27 ENCOUNTER — Other Ambulatory Visit: Payer: Self-pay | Admitting: Family Medicine

## 2016-02-27 DIAGNOSIS — I341 Nonrheumatic mitral (valve) prolapse: Secondary | ICD-10-CM

## 2016-02-28 ENCOUNTER — Encounter: Payer: Self-pay | Admitting: Internal Medicine

## 2016-03-02 ENCOUNTER — Encounter: Payer: Self-pay | Admitting: Internal Medicine

## 2016-03-02 ENCOUNTER — Ambulatory Visit (INDEPENDENT_AMBULATORY_CARE_PROVIDER_SITE_OTHER): Payer: Commercial Managed Care - HMO | Admitting: Internal Medicine

## 2016-03-02 VITALS — BP 124/82 | HR 56 | Ht 64.0 in | Wt 127.6 lb

## 2016-03-02 DIAGNOSIS — R5383 Other fatigue: Secondary | ICD-10-CM | POA: Diagnosis not present

## 2016-03-02 DIAGNOSIS — R42 Dizziness and giddiness: Secondary | ICD-10-CM

## 2016-03-02 DIAGNOSIS — R002 Palpitations: Secondary | ICD-10-CM | POA: Diagnosis not present

## 2016-03-02 MED ORDER — METOPROLOL SUCCINATE ER 25 MG PO TB24: 25.0000 mg | ORAL_TABLET | Freq: Every day | ORAL | 3 refills | Status: DC

## 2016-03-02 NOTE — Patient Instructions (Addendum)
Medication Instructions:    Your physician has recommended you make the following change in your medication:   1) STOP Atenolol  2) START Toprol 25 mg once daily  --- If you need a refill on your cardiac medications before your next appointment, please call your pharmacy. ---  Labwork:  None ordered  Testing/Procedures: Your physician has requested that you have an exercise tolerance test. For further information please visit [email or web address]. Please also follow instruction sheet, as given.  Follow-Up:  Your physician wants you to follow-up in: 1 year with Dr. Lovena Le.  You will receive a reminder letter in the mail two months in advance. If you don't receive a letter, please call our office to schedule the follow-up appointment.  Thank you for choosing [facility]!!     Any Other Special Instructions Will Be Listed Below (If Applicable).  Metoprolol extended-release tablets What is this medicine? METOPROLOL (me TOE proe lole) is a beta-blocker. Beta-blockers reduce the workload on the heart and help it to beat more regularly. This medicine is used to treat high blood pressure and to prevent chest pain. It is also used to after a heart attack and to prevent an additional heart attack from occurring. This medicine may be used for other purposes; ask your health care provider or pharmacist if you have questions. What should I tell my health care provider before I take this medicine? They need to know if you have any of these conditions: -diabetes -heart or vessel disease like slow heart rate, worsening heart failure, heart block, sick sinus syndrome or Raynaud's disease -kidney disease -liver disease -lung or breathing disease, like asthma or emphysema -pheochromocytoma -thyroid disease -an unusual or allergic reaction to metoprolol, other beta-blockers, medicines, foods, dyes, or preservatives -pregnant or trying to get pregnant -breast-feeding How should I use this  medicine? Take this medicine by mouth with a glass of water. Follow the directions on the prescription label. Do not crush or chew. Take this medicine with or immediately after meals. Take your doses at regular intervals. Do not take more medicine than directed. Do not stop taking this medicine suddenly. This could lead to serious heart-related effects. Talk to your pediatrician regarding the use of this medicine in children. While this drug may be prescribed for children as young as 6 years for selected conditions, precautions do apply. Overdosage: If you think you have taken too much of this medicine contact a poison control center or emergency room at once. NOTE: This medicine is only for you. Do not share this medicine with others. What if I miss a dose? If you miss a dose, take it as soon as you can. If it is almost time for your next dose, take only that dose. Do not take double or extra doses. What may interact with this medicine? This medicine may interact with the following medications: -certain medicines for blood pressure, heart disease, irregular heart beat -certain medicines for depression, like monoamine oxidase (MAO) inhibitors, fluoxetine, or paroxetine -clonidine -dobutamine -epinephrine -isoproterenol -reserpine This list may not describe all possible interactions. Give your health care provider a list of all the medicines, herbs, non-prescription drugs, or dietary supplements you use. Also tell them if you smoke, drink alcohol, or use illegal drugs. Some items may interact with your medicine. What should I watch for while using this medicine? Visit your doctor or health care professional for regular check ups. Contact your doctor right away if your symptoms worsen. Check your blood pressure and pulse rate regularly.  Ask your health care professional what your blood pressure and pulse rate should be, and when you should contact them. You may get drowsy or dizzy. Do not drive, use  machinery, or do anything that needs mental alertness until you know how this medicine affects you. Do not sit or stand up quickly, especially if you are an older patient. This reduces the risk of dizzy or fainting spells. Contact your doctor if these symptoms continue. Alcohol may interfere with the effect of this medicine. Avoid alcoholic drinks. What side effects may I notice from receiving this medicine? Side effects that you should report to your doctor or health care professional as soon as possible: -allergic reactions like skin rash, itching or hives -cold or numb hands or feet -depression -difficulty breathing -faint -fever with sore throat -irregular heartbeat, chest pain -rapid weight gain -swollen legs or ankles Side effects that usually do not require medical attention (report to your doctor or health care professional if they continue or are bothersome): -anxiety or nervousness -change in sex drive or performance -dry skin -headache -nightmares or trouble sleeping -short term memory loss -stomach upset or diarrhea -unusually tired This list may not describe all possible side effects. Call your doctor for medical advice about side effects. You may report side effects to FDA at 1-800-FDA-1088. Where should I keep my medicine? Keep out of the reach of children. Store at room temperature between 15 and 30 degrees C (59 and 86 degrees F). Throw away any unused medicine after the expiration date. NOTE: This sheet is a summary. It may not cover all possible information. If you have questions about this medicine, talk to your doctor, pharmacist, or health care provider.    2016, Elsevier/Gold Standard. (2013-02-03 14:41:37)    Exercise Stress Electrocardiogram An exercise stress electrocardiogram is a test that is done to evaluate the blood supply to your heart. This test may also be called exercise stress electrocardiography. The test is done while you are walking on a  treadmill. The goal of this test is to raise your heart rate. This test is done to find areas of poor blood flow to the heart by determining the extent of coronary artery disease (CAD).   CAD is defined as narrowing in one or more heart (coronary) arteries of more than 70%. If you have an abnormal test result, this may mean that you are not getting adequate blood flow to your heart during exercise. Additional testing may be needed to understand why your test was abnormal. LET [facility] CARE PROVIDER KNOW ABOUT:   Any allergies you have.  All medicines you are taking, including vitamins, herbs, eye drops, creams, and over-the-counter medicines.  Previous problems you or members of your family have had with the use of anesthetics.  Any blood disorders you have.  Previous surgeries you have had.  Medical conditions you have.  Possibility of pregnancy, if this applies. RISKS AND COMPLICATIONS Generally, this is a safe procedure. However, as with any procedure, complications can occur. Possible complications can include:  Pain or pressure in the following areas:  Chest.  Jaw or neck.  Between your shoulder blades.  Radiating down your left arm.  Dizziness or light-headedness.  Shortness of breath.  Increased or irregular heartbeats.  Nausea or vomiting.  Heart attack (rare). BEFORE THE PROCEDURE  Avoid all forms of caffeine 24 hours before your test or as directed by your health care provider. This includes coffee, tea (even decaffeinated tea), caffeinated sodas, chocolate, cocoa, and  certain pain medicines.  Follow your health care provider's instructions regarding eating and drinking before the test.  Take your medicines as directed at regular times with water unless instructed otherwise. Exceptions may include:  If you have diabetes, ask how you are to take your insulin or pills. It is common to adjust insulin dosing the morning of the test.  If you are taking  beta-blocker medicines, it is important to talk to your health care provider about these medicines well before the date of your test. Taking beta-blocker medicines may interfere with the test. In some cases, these medicines need to be changed or stopped 24 hours or more before the test.  If you wear a nitroglycerin patch, it may need to be removed prior to the test. Ask your health care provider if the patch should be removed before the test.  If you use an inhaler for any breathing condition, bring it with you to the test.  If you are an outpatient, bring a snack so you can eat right after the stress phase of the test.  Do not smoke for 4 hours prior to the test or as directed by your health care provider.  Do not apply lotions, powders, creams, or oils on your chest prior to the test.  Wear loose-fitting clothes and comfortable shoes for the test. This test involves walking on a treadmill. PROCEDURE  Multiple patches (electrodes) will be put on your chest. If needed, small areas of your chest may have to be shaved to get better contact with the electrodes. Once the electrodes are attached to your body, multiple wires will be attached to the electrodes and your heart rate will be monitored.  Your heart will be monitored both at rest and while exercising.  You will walk on a treadmill. The treadmill will be started at a slow pace. The treadmill speed and incline will gradually be increased to raise your heart rate. AFTER THE PROCEDURE  Your heart rate and blood pressure will be monitored after the test.  You may return to your normal schedule including diet, activities, and medicines, unless your health care provider tells you otherwise.   This information is not intended to replace advice given to you by your health care provider. Make sure you discuss any questions you have with your health care provider.   Document Released: 05/29/2000 Document Revised: 06/06/2013 Document Reviewed:  02/06/2013 Elsevier Interactive Patient Education [employer].

## 2016-03-02 NOTE — Progress Notes (Signed)
HPI Dana Massey returns today for followup. She has a h/o frequent PAC's and non-sustained SVT, likely atrial tachycardia. She is taking atenolol. She feels well. She denies chest pain or sob. No syncope.  She is anxious as they are stopping atenolol. Her main complaint today is that when she exerts herself, she will get dizzy and lightheaded. No other complaints.  Allergies  Allergen Reactions  . Wasp Venom Anaphylaxis    Has EpiPen     Current Outpatient Prescriptions  Medication Sig Dispense Refill  . aspirin EC 81 MG tablet Take 81 mg by mouth daily.    [name] atenolol (TENORMIN) 25 MG tablet Take 1 tablet (25 mg total) by mouth daily. 90 tablet 3  . brinzolamide (AZOPT) 1 % ophthalmic suspension Place 1 drop into both eyes 2 (two) times daily.     . Calcium-Vitamin D-Vitamin K (VIACTIV) 500-100-40 MG-UNT-MCG CHEW Chew 1 capsule by mouth daily.      . cycloSPORINE (RESTASIS) 0.05 % ophthalmic emulsion Place 1 drop into both eyes 2 (two) times daily.      . fish oil-omega-3 fatty acids 1000 MG capsule Take 2 g by mouth daily.      [name] latanoprost (XALATAN) 0.005 % ophthalmic solution Place 1 drop into both eyes at bedtime.    [name] MELATONIN PO Take 6 mg by mouth at bedtime as needed.     . Multiple Vitamins-Minerals (MULTIVITAMIN WITH MINERALS) tablet Take 1 tablet by mouth daily.      . polyethylene glycol powder (MIRALAX) powder Take 17 g by mouth daily as needed.     . sertraline (ZOLOFT) 50 MG tablet Take 1.5 tablets (75 mg total) by mouth daily. 45 tablet 3  . vitamin C (ASCORBIC ACID) 500 MG tablet Take 500 mg by mouth daily.      [name] zonisamide (ZONEGRAN) 100 MG capsule Take 300 mg by mouth daily. Take 300 mg once daily     No current facility-administered medications for this visit.      Past Medical History:  Diagnosis Date  . Diverticulosis of colon   . Glaucoma   . IBS (irritable bowel syndrome)   . Migraine headache   . Mitral valve prolapse   . Osteopenia   .  Palpitations   . Scoliosis   . Shoulder pain   . Toxic effect of venom(989.5)   . Venous insufficiency     ROS:   All systems reviewed and negative except as noted in the HPI.   Past Surgical History:  Procedure Laterality Date  . BACK SURGERY  1994   scoliosis at [facility] spine center  . CATARACT EXTRACTION  11/11   left  . ROTATOR CUFF REPAIR       Family History  Problem Relation Age of Onset  . Diabetes Mother      Social History   Social History  . Marital status: Married    Spouse name: [name]  . Number of children: 2  . Years of education: N/A   Occupational History  . Not on file.   Social History Main Topics  . Smoking status: Never Smoker  . Smokeless tobacco: Never Used  . Alcohol use No  . Drug use: No  . Sexual activity: Yes    Partners: Male     Comment: husband vasectomy   Other Topics Concern  . Not on file   Social History Narrative   Does not have a living will.   Desires  CPR but would not want prolonged life support.           BP 124/82   Pulse (!) 56   Ht 5\' 4"  (1.626 m)   Wt 127 lb 9.6 oz (57.9 kg)   LMP 09/13/2000   BMI 21.90 kg/m   Physical Exam:  Well appearing 70 yo woman, NAD HEENT: Unremarkable Neck:  No JVD, no thyromegally Back:  No CVA tenderness Lungs:  Clear with no wheezes HEART:  Regular rate rhythm, no murmurs, no rubs, no clicks Abd:  soft, positive bowel sounds, no organomegally, no rebound, no guarding Ext:  2 plus pulses, no edema, no cyanosis, no clubbing Skin:  No rashes no nodules Neuro:  CN II through XII intact, motor grossly intact  EKG - nsr   Assess/Plan: 1. Palpitations - her symptoms are reasonably well controlled. We will switch her to toprol from atenolol as the supply is going away. 2. Exercise dizziness- will have her undergo a regular treadmill test. 3. Atrial tachy - she has had no sustained arrhythmias. She is encouraged to avoid caffeine.  Mikle Bosworth.D.

## 2016-03-17 DIAGNOSIS — H40013 Open angle with borderline findings, low risk, bilateral: Secondary | ICD-10-CM | POA: Diagnosis not present

## 2016-03-25 ENCOUNTER — Encounter: Payer: Self-pay | Admitting: Family Medicine

## 2016-03-25 ENCOUNTER — Other Ambulatory Visit: Payer: Self-pay | Admitting: Family Medicine

## 2016-03-25 MED ORDER — SERTRALINE HCL 50 MG PO TABS
75.0000 mg | ORAL_TABLET | Freq: Every day | ORAL | 3 refills | Status: DC
Start: 2016-03-25 — End: 2017-01-05

## 2016-03-26 ENCOUNTER — Ambulatory Visit (INDEPENDENT_AMBULATORY_CARE_PROVIDER_SITE_OTHER): Payer: Commercial Managed Care - HMO

## 2016-03-26 DIAGNOSIS — R5383 Other fatigue: Secondary | ICD-10-CM

## 2016-03-26 DIAGNOSIS — R42 Dizziness and giddiness: Secondary | ICD-10-CM | POA: Diagnosis not present

## 2016-03-26 LAB — EXERCISE TOLERANCE TEST
CHL CUP MPHR: 151 {beats}/min
CHL CUP RESTING HR STRESS: 65 {beats}/min
CSEPPHR: 137 {beats}/min
Estimated workload: 7 METS
Exercise duration (min): 6 min
Exercise duration (sec): 0 s
Percent HR: 90 %
RPE: 15

## 2016-04-15 ENCOUNTER — Ambulatory Visit (INDEPENDENT_AMBULATORY_CARE_PROVIDER_SITE_OTHER): Payer: Commercial Managed Care - HMO

## 2016-04-15 DIAGNOSIS — Z23 Encounter for immunization: Secondary | ICD-10-CM | POA: Diagnosis not present

## 2016-04-20 ENCOUNTER — Encounter: Payer: Self-pay | Admitting: Family Medicine

## 2016-04-27 DIAGNOSIS — G43719 Chronic migraine without aura, intractable, without status migrainosus: Secondary | ICD-10-CM | POA: Diagnosis not present

## 2016-04-27 DIAGNOSIS — G43019 Migraine without aura, intractable, without status migrainosus: Secondary | ICD-10-CM | POA: Diagnosis not present

## 2016-05-20 ENCOUNTER — Encounter: Payer: Self-pay | Admitting: Family Medicine

## 2016-05-20 ENCOUNTER — Ambulatory Visit (INDEPENDENT_AMBULATORY_CARE_PROVIDER_SITE_OTHER): Payer: Commercial Managed Care - HMO | Admitting: Family Medicine

## 2016-05-20 DIAGNOSIS — G250 Essential tremor: Secondary | ICD-10-CM | POA: Insufficient documentation

## 2016-05-20 NOTE — Progress Notes (Signed)
Subjective:   Patient ID: Dana Massey, female    DOB: Nov 20, 1945, 70 y.o.   MRN: [id number]  Dana Massey is a pleasant 70 y.o. year old female who presents to clinic today with Tremors  on 05/20/2016  HPI:  Has had an essential tremor in both hands for "as long as I can remember."  Usually occurs when she is reaching for something.  Feels it is getting a little worse.  No UE weakness.  Does not drink caffeine.  She is unsure if it gets worse with fatigue.  Has never taken anything for it.  Already takes a beta blocker (Toprol XL).  Current Outpatient Prescriptions on File Prior to Visit  Medication Sig Dispense Refill  . aspirin EC 81 MG tablet Take 81 mg by mouth daily.    . brinzolamide (AZOPT) 1 % ophthalmic suspension Place 1 drop into both eyes 2 (two) times daily.     . Calcium-Vitamin D-Vitamin K (VIACTIV) 500-100-40 MG-UNT-MCG CHEW Chew 1 capsule by mouth daily.      . cycloSPORINE (RESTASIS) 0.05 % ophthalmic emulsion Place 1 drop into both eyes 2 (two) times daily.      . fish oil-omega-3 fatty acids 1000 MG capsule Take 2 g by mouth daily.      [name] latanoprost (XALATAN) 0.005 % ophthalmic solution Place 1 drop into both eyes at bedtime.    [name] MELATONIN PO Take 6 mg by mouth at bedtime as needed.     . metoprolol succinate (TOPROL-XL) 25 MG 24 hr tablet Take 1 tablet (25 mg total) by mouth daily. Take with or immediately following a meal. 90 tablet 3  . Multiple Vitamins-Minerals (MULTIVITAMIN WITH MINERALS) tablet Take 1 tablet by mouth daily.      . polyethylene glycol powder (MIRALAX) powder Take 17 g by mouth daily as needed.     . sertraline (ZOLOFT) 50 MG tablet Take 1.5 tablets (75 mg total) by mouth daily. 135 tablet 3  . vitamin C (ASCORBIC ACID) 500 MG tablet Take 500 mg by mouth daily.      [name] zonisamide (ZONEGRAN) 100 MG capsule Take 200 mg by mouth daily.      No current facility-administered medications on file prior to visit.     Allergies    Allergen Reactions  . Wasp Venom Anaphylaxis    Has EpiPen    Past Medical History:  Diagnosis Date  . Diverticulosis of colon   . Glaucoma   . IBS (irritable bowel syndrome)   . Migraine headache   . Mitral valve prolapse   . Osteopenia   . Palpitations   . Scoliosis   . Shoulder pain   . Toxic effect of venom(989.5)   . Venous insufficiency     Past Surgical History:  Procedure Laterality Date  . BACK SURGERY  1994   scoliosis at [facility] spine center  . CATARACT EXTRACTION  11/11   left  . ROTATOR CUFF REPAIR      Family History  Problem Relation Age of Onset  . Diabetes Mother     Social History   Social History  . Marital status: Married    Spouse name: Dana Massey  . Number of children: 2  . Years of education: N/A   Occupational History  . Not on file.   Social History Main Topics  . Smoking status: Never Smoker  . Smokeless tobacco: Never Used  . Alcohol use No  . Drug use: No  . Sexual activity:  Yes    Partners: Male     Comment: husband vasectomy   Other Topics Concern  . Not on file   Social History Narrative   Does not have a living will.   Desires CPR but would not want prolonged life support.         The PMH, PSH, Social History, Family History, Medications, and allergies have been reviewed in [facility], and have been updated if relevant.   Review of Systems  Constitutional: Negative.   Eyes: Negative.   Respiratory: Negative.   Cardiovascular: Negative.   Gastrointestinal: Negative.   Endocrine: Negative.   Genitourinary: Negative.   Musculoskeletal: Negative.   Allergic/Immunologic: Negative.   Neurological: Positive for tremors. Negative for dizziness, seizures, syncope, facial asymmetry, speech difficulty, weakness, light-headedness, numbness and headaches.  Hematological: Negative.   Psychiatric/Behavioral: Negative.   All other systems reviewed and are negative.      Objective:    BP 116/68   Pulse 78   Temp 98 F (36.7 C)  (Oral)   Wt 129 lb 4 oz (58.6 kg)   LMP 09/13/2000   SpO2 93%   BMI 22.19 kg/m    Physical Exam  Constitutional: She appears well-developed and well-nourished. No distress.  HENT:  Head: Normocephalic and atraumatic.  Eyes: Conjunctivae are normal.  Cardiovascular: Normal rate.   Pulmonary/Chest: Effort normal.  Neurological: She is alert.  + tremor in hands bilateral when ask to touch her forehead or any type of intentional movement, none at rest.  Skin: Skin is warm and dry. She is not diaphoretic.  Psychiatric: She has a normal mood and affect. Her behavior is normal. Judgment and thought content normal.  Nursing note and vitals reviewed.         Assessment & Plan:   Essential tremor No Follow-up on file.

## 2016-05-20 NOTE — Assessment & Plan Note (Signed)
Continues to appear benign.  Discussed changing to another beta blocker that may be more effective for the tremor and or referral to neurology.  She declines both at this time. Call or return to clinic prn if these symptoms worsen or fail to improve as anticipated. The patient indicates understanding of these issues and agrees with the plan.

## 2016-05-20 NOTE — Patient Instructions (Signed)
Essential Tremor Introduction A tremor is trembling or shaking that you cannot control. Most tremors affect the hands or arms. Tremors can also affect the head, vocal cords, face, and other parts of the body. Essential tremor is a tremor without a known cause. What are the causes? Essential tremor has no known cause. What increases the risk? You may be at greater risk of essential tremor if:  You have a family member with essential tremor.  You are age 69 or older.  You take certain medicines. What are the signs or symptoms? The main sign of a tremor is uncontrolled and unintentional rhythmic shaking of a body part.  You may have difficulty eating with a spoon or fork.  You may have difficulty writing.  You may nod your head up and down or side to side.  You may have a quivering voice. Your tremors:  May get worse over time.  May come and go.  May be more noticeable on one side of your body.  May get worse due to stress, fatigue, caffeine, and extreme heat or cold.[name] How is this treated? Your tremors may go away without treatment. Mild tremors may not need treatment if they do not affect your day-to-day life. Severe tremors may need to be treated using one or a combination of the following options:  Medicines. This may include medicine that is injected.  Lifestyle changes.  Physical therapy. Follow these instructions at home:  Take medicines only as directed by your health care provider.  Limit alcohol intake to no more than 1 drink per day for nonpregnant women and 2 drinks per day for men. One drink equals 12 oz of beer, 5 oz of wine, or 1 oz of hard liquor.  Do not use any tobacco products, including cigarettes, chewing tobacco, or electronic cigarettes. If you need help quitting, ask your health care provider.  Take medicines only as directed by your health care provider.  Avoid extreme heat or cold.  Limit the amount of caffeine you consumeas directed by  your health care provider.  Try to get eight hours of sleep each night.  Find ways to manage your stress, such as meditation or yoga.  Keep all follow-up visits as directed by your health care provider. This is important. This includes any physical therapy visits. Contact a health care provider if:  You experience any changes in the location or intensity of your tremors.  You start having a tremor after starting a new medicine.  You have tremor with other symptoms such as:  Numbness.  Tingling.  Pain.  Weakness.  Your tremor gets worse.  Your tremor interferes with your daily life. This information is not intended to replace advice given to you by your health care provider. Make sure you discuss any questions you have with your health care provider. Document Released: 06/22/2014 Document Revised: 11/07/2015 Document Reviewed: 11/27/2013  2017 Elsevier

## 2016-08-04 DIAGNOSIS — D225 Melanocytic nevi of trunk: Secondary | ICD-10-CM | POA: Diagnosis not present

## 2016-08-04 DIAGNOSIS — Z85828 Personal history of other malignant neoplasm of skin: Secondary | ICD-10-CM | POA: Diagnosis not present

## 2016-08-04 DIAGNOSIS — L814 Other melanin hyperpigmentation: Secondary | ICD-10-CM | POA: Diagnosis not present

## 2016-08-04 DIAGNOSIS — L570 Actinic keratosis: Secondary | ICD-10-CM | POA: Diagnosis not present

## 2016-08-04 DIAGNOSIS — D1801 Hemangioma of skin and subcutaneous tissue: Secondary | ICD-10-CM | POA: Diagnosis not present

## 2016-08-04 DIAGNOSIS — L821 Other seborrheic keratosis: Secondary | ICD-10-CM | POA: Diagnosis not present

## 2016-09-08 DIAGNOSIS — Z1231 Encounter for screening mammogram for malignant neoplasm of breast: Secondary | ICD-10-CM | POA: Diagnosis not present

## 2016-09-09 ENCOUNTER — Encounter: Payer: Self-pay | Admitting: Family Medicine

## 2016-09-10 ENCOUNTER — Encounter: Payer: Self-pay | Admitting: Family Medicine

## 2016-09-10 ENCOUNTER — Other Ambulatory Visit: Payer: Self-pay | Admitting: Family Medicine

## 2016-09-10 DIAGNOSIS — R251 Tremor, unspecified: Secondary | ICD-10-CM

## 2016-09-22 DIAGNOSIS — H5210 Myopia, unspecified eye: Secondary | ICD-10-CM | POA: Diagnosis not present

## 2016-10-27 DIAGNOSIS — G43019 Migraine without aura, intractable, without status migrainosus: Secondary | ICD-10-CM | POA: Diagnosis not present

## 2016-10-27 DIAGNOSIS — G43719 Chronic migraine without aura, intractable, without status migrainosus: Secondary | ICD-10-CM | POA: Diagnosis not present

## 2016-11-10 DIAGNOSIS — G250 Essential tremor: Secondary | ICD-10-CM | POA: Diagnosis not present

## 2016-11-10 DIAGNOSIS — E559 Vitamin D deficiency, unspecified: Secondary | ICD-10-CM | POA: Diagnosis not present

## 2016-11-10 DIAGNOSIS — E538 Deficiency of other specified B group vitamins: Secondary | ICD-10-CM | POA: Diagnosis not present

## 2016-11-12 ENCOUNTER — Other Ambulatory Visit: Payer: Self-pay | Admitting: Neurology

## 2016-11-12 DIAGNOSIS — G250 Essential tremor: Secondary | ICD-10-CM

## 2016-11-20 ENCOUNTER — Ambulatory Visit
Admission: RE | Admit: 2016-11-20 | Discharge: 2016-11-20 | Disposition: A | Discharge status: Home / Self Care | Payer: Medicare HMO | Source: Ambulatory Visit | Attending: Neurology | Admitting: Neurology

## 2016-11-20 DIAGNOSIS — R251 Tremor, unspecified: Secondary | ICD-10-CM | POA: Diagnosis not present

## 2016-11-20 DIAGNOSIS — G250 Essential tremor: Secondary | ICD-10-CM | POA: Insufficient documentation

## 2016-12-28 NOTE — Progress Notes (Signed)
Subjective:   Dana Massey is a 71 y.o. female who presents for Medicare Annual (Subsequent) preventive examination.  Review of Systems:  No ROS.  Medicare Wellness Visit. Additional risk factors are reflected in the social history.  Cardiac Risk Factors include: advanced age (>26men, >76 women);dyslipidemia;hypertension     Objective:     Vitals: BP 124/76   Pulse 62   Ht 5\' 4"  (1.626 m)   Wt 131 lb 8 oz (59.6 kg)   LMP 09/13/2000   SpO2 96%   BMI 22.57 kg/m   Body mass index is 22.57 kg/m.   Tobacco History  Smoking Status  . Never Smoker  Smokeless Tobacco  . Never Used     Counseling given: Not Answered   Past Medical History:  Diagnosis Date  . Diverticulosis of colon   . Glaucoma   . IBS (irritable bowel syndrome)   . Migraine headache   . Mitral valve prolapse   . Osteopenia   . Palpitations   . Scoliosis   . Shoulder pain   . Toxic effect of venom(989.5)   . Venous insufficiency    Past Surgical History:  Procedure Laterality Date  . BACK SURGERY  1994   scoliosis at [facility] spine center  . CATARACT EXTRACTION  11/11   left  . ROTATOR CUFF REPAIR     Family History  Problem Relation Age of Onset  . Diabetes Mother    History  Sexual Activity  . Sexual activity: Yes  . Partners: Male    Comment: husband vasectomy    Outpatient Encounter Prescriptions as of 01/05/2017  Medication Sig  . aspirin EC 81 MG tablet Take 81 mg by mouth daily.  . brinzolamide (AZOPT) 1 % ophthalmic suspension Place 1 drop into both eyes 2 (two) times daily.   . Calcium-Vitamin D-Vitamin K (VIACTIV) 500-100-40 MG-UNT-MCG CHEW Chew 1 capsule by mouth daily.    . cycloSPORINE (RESTASIS) 0.05 % ophthalmic emulsion Place 1 drop into both eyes 2 (two) times daily.    . fish oil-omega-3 fatty acids 1000 MG capsule Take 2 g by mouth daily.    [name] latanoprost (XALATAN) 0.005 % ophthalmic solution Place 1 drop into both eyes at bedtime.  [name] MELATONIN PO Take 6 mg by  mouth at bedtime as needed.   . Multiple Vitamins-Minerals (MULTIVITAMIN WITH MINERALS) tablet Take 1 tablet by mouth daily.    . polyethylene glycol powder (MIRALAX) powder Take 17 g by mouth daily as needed.   . sertraline (ZOLOFT) 50 MG tablet Take 1.5 tablets (75 mg total) by mouth daily.  [name] zonisamide (ZONEGRAN) 100 MG capsule Take 100 mg by mouth daily.   . metoprolol succinate (TOPROL-XL) 25 MG 24 hr tablet Take 1 tablet (25 mg total) by mouth daily. Take with or immediately following a meal.  . [DISCONTINUED] vitamin C (ASCORBIC ACID) 500 MG tablet Take 500 mg by mouth daily.     No facility-administered encounter medications on file as of 01/05/2017.     Activities of Daily Living In your present state of health, do you have any difficulty performing the following activities: 01/05/2017  Hearing? N  Vision? N  Difficulty concentrating or making decisions? N  Walking or climbing stairs? N  Dressing or bathing? N  Doing errands, shopping? N  Preparing Food and eating ? N  Using the Toilet? N  In the past six months, have you accidently leaked urine? N  Do you have problems with loss of bowel  control? N  Managing your Medications? N  Managing your Finances? N  Housekeeping or managing your Housekeeping? N  Some recent data might be hidden    Patient Care Team: Lucille Passy, MD as PCP - General (Family Medicine) Evans Lance, MD as Consulting Physician (Cardiology) Macarthur Critchley, [place] as Referring Physician (Optometry) Orie Rout, MD as Consulting Physician (Neurology) Glennie Isle, PA-C as Physician Assistant (Dermatology) Earley Favor, DDS as Referring Physician (Dentistry) Vladimir Crofts, MD as Consulting Physician (Neurology)    Assessment:    Physical assessment deferred to PCP.  Exercise Activities and Dietary recommendations Current Exercise Habits: Home exercise routine, Type of exercise: Other - see comments (Silver Sneakers), Time (Minutes): 45,  Frequency (Times/Week): 3, Weekly Exercise (Minutes/Week): 135  Goals    . Increase physical activity          Starting 01/05/2017, I will continue to exercise for at least 45 min 3 days per week.       Fall Risk Fall Risk  01/05/2017 12/25/2015 12/25/2015 12/24/2014 11/25/2012  Falls in the past year? No No No No No   Depression Screen PHQ 2/9 Scores 01/05/2017 12/25/2015 12/25/2015 12/24/2014  PHQ - 2 Score 0 6 6 0  PHQ- 9 Score - 16 16 -     Cognitive Function PLEASE NOTE: A Mini-Cog screen was completed. Maximum score is 20. A value of 0 denotes this part of Folstein MMSE was not completed or the patient failed this part of the Mini-Cog screening.   Mini-Cog Screening Orientation to Time - Max 5 pts Orientation to Place - Max 5 pts Registration - Max 3 pts Recall - Max 3 pts Language Repeat - Max 1 pts Language Follow 3 Step Command - Max 3 pts      Mini-Cog - 01/05/17 1125    Normal clock drawing test? yes   How many words correct? 3      MMSE - Mini Mental State Exam 01/05/2017 12/25/2015  Orientation to time 5 5  Orientation to Place 5 5  Registration 3 3  Attention/ Calculation 0 0  Recall 3 3  Language- name 2 objects 0 0  Language- repeat 1 1  Language- follow 3 step command 3 3  Language- read & follow direction 0 0  Write a sentence 0 0  Copy design 0 0  Total score 20 20        Immunization History  Administered Date(s) Administered  . Influenza Split 05/26/2011, 03/22/2012  . Influenza Whole 03/07/2008, 03/29/2009, 03/14/2010  . Influenza,inj,Quad PF,36+ Mos 03/21/2013, 02/21/2014, 03/28/2015, 04/15/2016  . Pneumococcal Conjugate-13 12/18/2013  . Pneumococcal Polysaccharide-23 11/20/2011  . Td 12/24/2014  . Zoster 12/18/2013   Screening Tests Health Maintenance  Topic Date Due  . INFLUENZA VACCINE  01/13/2017  . COLONOSCOPY  10/20/2017  . MAMMOGRAM  09/09/2018  . TETANUS/TDAP  12/23/2024  . DEXA SCAN  Completed  . Hepatitis C Screening   Completed  . PNA vac Low Risk Adult  Completed      Plan:    Follow-up w/ PCP as scheduled.   I have personally reviewed and noted the following in the patient's chart:   . Medical and social history . Use of alcohol, tobacco or illicit drugs  . Current medications and supplements . Functional ability and status . Nutritional status . Physical activity . Advanced directives . List of other physicians . Vitals . Screenings to include cognitive, depression, and falls . Referrals and appointments  In  addition, I have reviewed and discussed with patient certain preventive protocols, quality metrics, and best practice recommendations. A written personalized care plan for preventive services as well as general preventive health recommendations were provided to patient.     Dorrene German, RN  01/05/2017

## 2016-12-28 NOTE — Progress Notes (Signed)
PCP notes:   Health maintenance: No gaps identified.    Abnormal screenings:  Hearing-failed. Saw audiologist last year after failed hearing exam and pt reports no hearing aids were recommended.   Patient concerns: None   Nurse concerns: None   Next PCP appt: Today immediately following AWV.

## 2017-01-05 ENCOUNTER — Ambulatory Visit (INDEPENDENT_AMBULATORY_CARE_PROVIDER_SITE_OTHER): Payer: Medicare HMO | Admitting: Family Medicine

## 2017-01-05 ENCOUNTER — Encounter: Payer: Self-pay | Admitting: Family Medicine

## 2017-01-05 ENCOUNTER — Other Ambulatory Visit (INDEPENDENT_AMBULATORY_CARE_PROVIDER_SITE_OTHER): Payer: Medicare HMO

## 2017-01-05 ENCOUNTER — Ambulatory Visit (INDEPENDENT_AMBULATORY_CARE_PROVIDER_SITE_OTHER): Payer: Medicare HMO

## 2017-01-05 VITALS — BP 124/76 | HR 62 | Ht 64.0 in | Wt 131.5 lb

## 2017-01-05 VITALS — BP 124/76 | HR 62 | Temp 98.0°F | Resp 16 | Ht 64.0 in | Wt 131.5 lb

## 2017-01-05 DIAGNOSIS — G43901 Migraine, unspecified, not intractable, with status migrainosus: Secondary | ICD-10-CM | POA: Diagnosis not present

## 2017-01-05 DIAGNOSIS — C4491 Basal cell carcinoma of skin, unspecified: Secondary | ICD-10-CM | POA: Diagnosis not present

## 2017-01-05 DIAGNOSIS — Z0000 Encounter for general adult medical examination without abnormal findings: Secondary | ICD-10-CM

## 2017-01-05 DIAGNOSIS — F32A Depression, unspecified: Secondary | ICD-10-CM

## 2017-01-05 DIAGNOSIS — G250 Essential tremor: Secondary | ICD-10-CM | POA: Diagnosis not present

## 2017-01-05 DIAGNOSIS — F329 Major depressive disorder, single episode, unspecified: Secondary | ICD-10-CM

## 2017-01-05 DIAGNOSIS — Z01419 Encounter for gynecological examination (general) (routine) without abnormal findings: Secondary | ICD-10-CM

## 2017-01-05 LAB — LIPID PANEL
CHOL/HDL RATIO: 3
Cholesterol: 197 mg/dL (ref 0–200)
HDL: 58.7 mg/dL (ref >39.00)
LDL CALC: 123 mg/dL — AB (ref 0–99)
NONHDL: 138.11
Triglycerides: 78 mg/dL (ref 0.0–149.0)
VLDL: 15.6 mg/dL (ref 0.0–40.0)

## 2017-01-05 MED ORDER — SERTRALINE HCL 50 MG PO TABS: 75.0000 mg | ORAL_TABLET | Freq: Every day | ORAL | 3 refills | Status: DC

## 2017-01-05 NOTE — Progress Notes (Signed)
I reviewed health advisor's note, was available for consultation, and agree with documentation and plan.  

## 2017-01-05 NOTE — Patient Instructions (Signed)
Great to see you.  WE will call you with your lab results and you can them online.

## 2017-01-05 NOTE — Patient Instructions (Addendum)
Dana Massey , Thank you for taking time to come for your Medicare Wellness Visit. I appreciate your ongoing commitment to your health goals. Please review the following plan we discussed and let me know if I can assist you in the future.   These are the goals we discussed: Goals    . Increase physical activity          Starting 01/05/2017, I will continue to exercise for at least 45 min 3 days per week.        This is a list of the screening recommended for you and due dates:  Health Maintenance  Topic Date Due  . Flu Shot  01/13/2017  . Colon Cancer Screening  10/20/2017  . Mammogram  09/09/2018  . Tetanus Vaccine  12/23/2024  . DEXA scan (bone density measurement)  Completed  .  Hepatitis C: One time screening is recommended by Center for Disease Control  (CDC) for  adults born from [birth year] through 1964.   Completed  . Pneumonia vaccines  Completed   Preventive Care for Adults  A healthy lifestyle and preventive care can promote health and wellness. Preventive health guidelines for adults include the following key practices.  . A routine yearly physical is a good way to check with your health care provider about your health and preventive screening. It is a chance to share any concerns and updates on your health and to receive a thorough exam.  . Visit your dentist for a routine exam and preventive care every 6 months. Brush your teeth twice a day and floss once a day. Good oral hygiene prevents tooth decay and gum disease.  . The frequency of eye exams is based on your age, health, family medical history, use  of contact lenses, and other factors. Follow your health care provider's ecommendations for frequency of eye exams.  . Eat a healthy diet. Foods like vegetables, fruits, whole grains, low-fat dairy products, and lean protein foods contain the nutrients you need without too many calories. Decrease your intake of foods high in solid fats, added sugars, and salt. Eat the right  amount of calories for you. Get information about a proper diet from your health care provider, if necessary.  . Regular physical exercise is one of the most important things you can do for your health. Most adults should get at least 150 minutes of moderate-intensity exercise (any activity that increases your heart rate and causes you to sweat) each week. In addition, most adults need muscle-strengthening exercises on 2 or more days a week.  Silver Sneakers may be a benefit available to you. To determine eligibility, you may visit the website: www.silversneakers.com or contact program at 9314554563 Mon-Fri between 8AM-8PM.   . Maintain a healthy weight. The body mass index (BMI) is a screening tool to identify possible weight problems. It provides an estimate of body fat based on height and weight. Your health care provider can find your BMI and can help you achieve or maintain a healthy weight.   For adults 20 years and older: ? A BMI below 18.5 is considered underweight. ? A BMI of 18.5 to 24.9 is normal. ? A BMI of 25 to 29.9 is considered overweight. ? A BMI of 30 and above is considered obese.   . Maintain normal blood lipids and cholesterol levels by exercising and minimizing your intake of saturated fat. Eat a balanced diet with plenty of fruit and vegetables. Blood tests for lipids and cholesterol should begin at age  20 and be repeated every 5 years. If your lipid or cholesterol levels are high, you are over 50, or you are at high risk for heart disease, you may need your cholesterol levels checked more frequently. Ongoing high lipid and cholesterol levels should be treated with medicines if diet and exercise are not working.  . If you smoke, find out from your health care provider how to quit. If you do not use tobacco, please do not start.  . If you choose to drink alcohol, please do not consume more than 2 drinks per day. One drink is considered to be 12 ounces (355 mL) of beer, 5  ounces (148 mL) of wine, or 1.5 ounces (44 mL) of liquor.  . If you are 8-53 years old, ask your health care provider if you should take aspirin to prevent strokes.  . Use sunscreen. Apply sunscreen liberally and repeatedly throughout the day. You should seek shade when your shadow is shorter than you. Protect yourself by wearing long sleeves, pants, a wide-brimmed hat, and sunglasses year round, whenever you are outdoors.  . Once a month, do a whole body skin exam, using a mirror to look at the skin on your back. Tell your health care provider of new moles, moles that have irregular borders, moles that are larger than a pencil eraser, or moles that have changed in shape or color.

## 2017-01-05 NOTE — Progress Notes (Signed)
Pre visit review using our clinic review tool, if applicable. No additional management support is needed unless otherwise documented below in the visit note. 

## 2017-01-05 NOTE — Assessment & Plan Note (Signed)
Improved, on lower dose of Zonegran now. No changes made. Followed by neuro.

## 2017-01-05 NOTE — Progress Notes (Signed)
Subjective:   Patient ID: Dana Massey, female    DOB: 15-Feb-1946, 71 y.o.   MRN: [id number]  Dana Massey is a pleasant 71 y.o. year old female who presents to clinic today with Annual Exam  and follow up of chronic medical conditions on 01/05/2017  HPI:  Had annual medicare wellness visit with [name], RN this morning. Notes reviewed.  No concerns.  Mammogram 08/2016 Colonoscopy 10/2007  Anxiety/depression- Much improved since she started zoloft last year-75 mg daily.  Migraines are much better.  Has actually decreased zonegran to 100 mg once daily.  Went to skin doctor in February 2018- h/o BCC.     Lab Results  Component Value Date   CHOL 221 (H) 12/25/2015   HDL 68.70 12/25/2015   LDLCALC 138 (H) 12/25/2015   LDLDIRECT 126.9 11/25/2012   TRIG 67.0 12/25/2015   CHOLHDL 3 12/25/2015   Lab Results  Component Value Date   NA 133 (L) 12/25/2015   K 4.4 12/25/2015   CL 108 12/25/2015   CO2 29 12/25/2015   Lab Results  Component Value Date   WBC 5.7 12/25/2015   HGB 14.9 12/25/2015   HCT 44.7 12/25/2015   MCV 96.5 12/25/2015   PLT 178.0 12/25/2015   Lab Results  Component Value Date   TSH 1.12 12/25/2015    Current Outpatient Prescriptions on File Prior to Visit  Medication Sig Dispense Refill  . aspirin EC 81 MG tablet Take 81 mg by mouth daily.    . brinzolamide (AZOPT) 1 % ophthalmic suspension Place 1 drop into both eyes 2 (two) times daily.     . Calcium-Vitamin D-Vitamin K (VIACTIV) 500-100-40 MG-UNT-MCG CHEW Chew 1 capsule by mouth daily.      . cycloSPORINE (RESTASIS) 0.05 % ophthalmic emulsion Place 1 drop into both eyes 2 (two) times daily.      . fish oil-omega-3 fatty acids 1000 MG capsule Take 2 g by mouth daily.      [name] latanoprost (XALATAN) 0.005 % ophthalmic solution Place 1 drop into both eyes at bedtime.    [name] MELATONIN PO Take 6 mg by mouth at bedtime as needed.     . Multiple Vitamins-Minerals (MULTIVITAMIN WITH MINERALS)  tablet Take 1 tablet by mouth daily.      . polyethylene glycol powder (MIRALAX) powder Take 17 g by mouth daily as needed.     . sertraline (ZOLOFT) 50 MG tablet Take 1.5 tablets (75 mg total) by mouth daily. 135 tablet 3  . zonisamide (ZONEGRAN) 100 MG capsule Take 100 mg by mouth daily.     . metoprolol succinate (TOPROL-XL) 25 MG 24 hr tablet Take 1 tablet (25 mg total) by mouth daily. Take with or immediately following a meal. 90 tablet 3   No current facility-administered medications on file prior to visit.     Allergies  Allergen Reactions  . Wasp Venom Anaphylaxis    Has EpiPen    Past Medical History:  Diagnosis Date  . Diverticulosis of colon   . Glaucoma   . IBS (irritable bowel syndrome)   . Migraine headache   . Mitral valve prolapse   . Osteopenia   . Palpitations   . Scoliosis   . Shoulder pain   . Toxic effect of venom(989.5)   . Venous insufficiency     Past Surgical History:  Procedure Laterality Date  . BACK SURGERY  1994   scoliosis at [facility] spine center  . CATARACT EXTRACTION  11/11  left  . ROTATOR CUFF REPAIR      Family History  Problem Relation Age of Onset  . Diabetes Mother     Social History   Social History  . Marital status: Married    Spouse name: [name]  . Number of children: 2  . Years of education: N/A   Occupational History  . Not on file.   Social History Main Topics  . Smoking status: Never Smoker  . Smokeless tobacco: Never Used  . Alcohol use No  . Drug use: No  . Sexual activity: Yes    Partners: Male     Comment: husband vasectomy   Other Topics Concern  . Not on file   Social History Narrative   Does not have a living will.   Desires CPR but would not want prolonged life support.         The PMH, PSH, Social History, Family History, Medications, and allergies have been reviewed in [facility], and have been updated if relevant.   Review of Systems  Constitutional: Negative.   HENT: Negative.   Respiratory:  Negative.   Cardiovascular: Negative.   Gastrointestinal: Negative.   Endocrine: Negative.   Genitourinary: Negative.   Musculoskeletal: Negative.   Skin: Negative.   Allergic/Immunologic: Negative.   Neurological: Negative.   Hematological: Negative.   Psychiatric/Behavioral: Negative.   All other systems reviewed and are negative.      Objective:    BP 124/76   Pulse 62   Temp 98 F (36.7 C) (Oral)   Resp 16   Ht 5\' 4"  (1.626 m)   Wt 131 lb 8 oz (59.6 kg)   LMP 09/13/2000   SpO2 96%   BMI 22.57 kg/m   BP Readings from Last 3 Encounters:  01/05/17 124/76  01/05/17 124/76  05/20/16 116/68   Wt Readings from Last 3 Encounters:  01/05/17 131 lb 8 oz (59.6 kg)  01/05/17 131 lb 8 oz (59.6 kg)  05/20/16 129 lb 4 oz (58.6 kg)    Physical Exam   General:  Well-developed,well-nourished,in no acute distress; alert,appropriate and cooperative throughout examination Head:  normocephalic and atraumatic.   Eyes:  vision grossly intact, PERRL Ears:  R ear normal and L ear normal externally, TMs clear bilaterally Nose:  no external deformity.   Mouth:  good dentition.   Neck:  No deformities, masses, or tenderness noted. Breasts:  No mass, nodules, thickening, tenderness, bulging, retraction, inflamation, nipple discharge or skin changes noted.   Lungs:  Normal respiratory effort, chest expands symmetrically. Lungs are clear to auscultation, no crackles or wheezes. Heart:  Normal rate and regular rhythm. S1 and S2 normal without gallop, murmur, click, rub or other extra sounds. Abdomen:  Bowel sounds positive,abdomen soft and non-tender without masses, organomegaly or hernias noted. Msk:  No deformity or scoliosis noted of thoracic or lumbar spine.   Extremities:  No clubbing, cyanosis, edema, or deformity noted with normal full range of motion of all joints.   Neurologic:  alert & oriented X3 and gait normal.   Skin:  Intact without suspicious lesions or rashes Psych:   Cognition and judgment appear intact. Alert and cooperative with normal attention span and concentration. No apparent delusions, illusions, hallucinations      Assessment & Plan:   Depression, unspecified depression type  Well woman exam  Essential tremor No Follow-up on file.

## 2017-01-05 NOTE — Assessment & Plan Note (Signed)
Reviewed preventive care protocols, scheduled due services, and updated immunizations Discussed nutrition, exercise, diet, and healthy lifestyle.  

## 2017-01-05 NOTE — Assessment & Plan Note (Signed)
Well controlled on current dose of zoloft.  No changes made to rxs.

## 2017-01-05 NOTE — Assessment & Plan Note (Signed)
Skin survey with derm UTD.

## 2017-02-26 ENCOUNTER — Other Ambulatory Visit: Payer: Self-pay | Admitting: Internal Medicine

## 2017-03-04 ENCOUNTER — Ambulatory Visit (INDEPENDENT_AMBULATORY_CARE_PROVIDER_SITE_OTHER): Payer: Medicare HMO | Admitting: Internal Medicine

## 2017-03-04 ENCOUNTER — Encounter: Payer: Self-pay | Admitting: Internal Medicine

## 2017-03-04 VITALS — BP 124/72 | HR 60 | Ht 64.0 in | Wt 132.0 lb

## 2017-03-04 DIAGNOSIS — R002 Palpitations: Secondary | ICD-10-CM | POA: Diagnosis not present

## 2017-03-04 NOTE — Patient Instructions (Addendum)
Medication Instructions:  ?Your physician recommends that you continue on your current medications as directed. Please refer to the Current Medication list given to you today. ? ?Labwork: ?None ordered. ? ?Testing/Procedures: ?None ordered. ? ?Follow-Up: ?Your physician wants you to follow-up as needed ? ? ?Any Other Special Instructions Will Be Listed Below (If Applicable). ? ?If you need a refill on your cardiac medications before your next appointment, please call your pharmacy.  ? ? ? ? ?

## 2017-03-04 NOTE — Progress Notes (Signed)
HPI Mrs. Balan returns today for ongoing evaluation and management of her atrial arrhythmias, PACs and nonsustained atrial tachycardia. She is a very pleasant 71 year old woman with a history of the above problems, who has done well in the interim. She is exercising regularly. No chest pain or sob. No syncope. No edema. No caffeine. Allergies  Allergen Reactions  . Wasp Venom Anaphylaxis    Has EpiPen     Current Outpatient Prescriptions  Medication Sig Dispense Refill  . aspirin EC 81 MG tablet Take 81 mg by mouth daily.    . brinzolamide (AZOPT) 1 % ophthalmic suspension Place 1 drop into both eyes 2 (two) times daily.     . Calcium-Vitamin D-Vitamin K (VIACTIV) 500-100-40 MG-UNT-MCG CHEW Chew 1 capsule by mouth daily.      . cycloSPORINE (RESTASIS) 0.05 % ophthalmic emulsion Place 1 drop into both eyes 2 (two) times daily.      . fish oil-omega-3 fatty acids 1000 MG capsule Take 2 g by mouth daily.      [name] latanoprost (XALATAN) 0.005 % ophthalmic solution Place 1 drop into both eyes at bedtime.    [name] MELATONIN PO Take 6 mg by mouth at bedtime as needed.     . metoprolol succinate (TOPROL-XL) 25 MG 24 hr tablet TAKE 1 TABLET DAILY WITH OR IMMEDIATELY FOLLOWING A MEAL 90 tablet 0  . Multiple Vitamins-Minerals (MULTIVITAMIN WITH MINERALS) tablet Take 1 tablet by mouth daily.      . polyethylene glycol powder (MIRALAX) powder Take 17 g by mouth daily as needed.     . sertraline (ZOLOFT) 50 MG tablet Take 1.5 tablets (75 mg total) by mouth daily. 135 tablet 3  . zonisamide (ZONEGRAN) 100 MG capsule Take 100 mg by mouth daily.      No current facility-administered medications for this visit.      Past Medical History:  Diagnosis Date  . Diverticulosis of colon   . Glaucoma   . IBS (irritable bowel syndrome)   . Migraine headache   . Mitral valve prolapse   . Osteopenia   . Palpitations   . Scoliosis   . Shoulder pain   . Toxic effect of venom(989.5)   . Venous  insufficiency     ROS:   All systems reviewed and negative except as noted in the HPI.   Past Surgical History:  Procedure Laterality Date  . BACK SURGERY  1994   scoliosis at [facility] spine center  . CATARACT EXTRACTION  11/11   left  . ROTATOR CUFF REPAIR       Family History  Problem Relation Age of Onset  . Diabetes Mother      Social History   Social History  . Marital status: Married    Spouse name: [name]  . Number of children: 2  . Years of education: N/A   Occupational History  . Not on file.   Social History Main Topics  . Smoking status: Never Smoker  . Smokeless tobacco: Never Used  . Alcohol use No  . Drug use: No  . Sexual activity: Yes    Partners: Male     Comment: husband vasectomy   Other Topics Concern  . Not on file   Social History Narrative   Does not have a living will.   Desires CPR but would not want prolonged life support.           BP 124/72   Pulse 60   Ht 5'  4" (1.626 m)   Wt 132 lb (59.9 kg)   LMP 09/13/2000   BMI 22.66 kg/m   Physical Exam:  Well appearing 71 yo woman, NAD HEENT: Unremarkable Neck:  6 cm JVD, no thyromegally Lymphatics:  No adenopathy Back:  No CVA tenderness Lungs:  Clear with no wheezes HEART:  Regular rate rhythm, no murmurs, no rubs, no clicks Abd:  soft, positive bowel sounds, no organomegally, no rebound, no guarding Ext:  2 plus pulses, no edema, no cyanosis, no clubbing Skin:  No rashes no nodules Neuro:  CN II through XII intact, motor grossly intact  EKG - NSR   Assess/Plan: 1. Palpitations - her symptoms are well controlled. She is off of her caffeine. She will continue her metoprolol.  2. Migraine HA's - these are well controlled with cessation of her caffeine and medical therapy. Will follow. 3. PAC's - none on her ECG. She is asymptomatic.   Mikle Bosworth.D.

## 2017-03-16 DIAGNOSIS — G250 Essential tremor: Secondary | ICD-10-CM | POA: Diagnosis not present

## 2017-03-19 ENCOUNTER — Ambulatory Visit (INDEPENDENT_AMBULATORY_CARE_PROVIDER_SITE_OTHER): Payer: Medicare HMO

## 2017-03-19 DIAGNOSIS — Z23 Encounter for immunization: Secondary | ICD-10-CM | POA: Diagnosis not present

## 2017-03-24 DIAGNOSIS — H40013 Open angle with borderline findings, low risk, bilateral: Secondary | ICD-10-CM | POA: Diagnosis not present

## 2017-04-08 ENCOUNTER — Encounter: Payer: Self-pay | Admitting: Family Medicine

## 2017-04-26 ENCOUNTER — Other Ambulatory Visit: Payer: Self-pay

## 2017-04-26 MED ORDER — METOPROLOL SUCCINATE ER 25 MG PO TB24: ORAL_TABLET | ORAL | 3 refills | Status: DC

## 2017-04-27 DIAGNOSIS — G43019 Migraine without aura, intractable, without status migrainosus: Secondary | ICD-10-CM | POA: Diagnosis not present

## 2017-04-27 DIAGNOSIS — G43719 Chronic migraine without aura, intractable, without status migrainosus: Secondary | ICD-10-CM | POA: Diagnosis not present

## 2017-04-27 DIAGNOSIS — D1801 Hemangioma of skin and subcutaneous tissue: Secondary | ICD-10-CM | POA: Diagnosis not present

## 2017-04-27 DIAGNOSIS — D225 Melanocytic nevi of trunk: Secondary | ICD-10-CM | POA: Diagnosis not present

## 2017-04-27 DIAGNOSIS — L570 Actinic keratosis: Secondary | ICD-10-CM | POA: Diagnosis not present

## 2017-04-27 DIAGNOSIS — L821 Other seborrheic keratosis: Secondary | ICD-10-CM | POA: Diagnosis not present

## 2017-09-02 DIAGNOSIS — L570 Actinic keratosis: Secondary | ICD-10-CM | POA: Diagnosis not present

## 2017-09-02 DIAGNOSIS — D1801 Hemangioma of skin and subcutaneous tissue: Secondary | ICD-10-CM | POA: Diagnosis not present

## 2017-09-02 DIAGNOSIS — L814 Other melanin hyperpigmentation: Secondary | ICD-10-CM | POA: Diagnosis not present

## 2017-09-02 DIAGNOSIS — D225 Melanocytic nevi of trunk: Secondary | ICD-10-CM | POA: Diagnosis not present

## 2017-09-02 DIAGNOSIS — L821 Other seborrheic keratosis: Secondary | ICD-10-CM | POA: Diagnosis not present

## 2017-09-09 DIAGNOSIS — Z1231 Encounter for screening mammogram for malignant neoplasm of breast: Secondary | ICD-10-CM | POA: Diagnosis not present

## 2017-09-22 DIAGNOSIS — Z0100 Encounter for examination of eyes and vision without abnormal findings: Secondary | ICD-10-CM | POA: Diagnosis not present

## 2017-09-22 DIAGNOSIS — H5210 Myopia, unspecified eye: Secondary | ICD-10-CM | POA: Diagnosis not present

## 2017-10-14 ENCOUNTER — Encounter: Payer: Self-pay | Admitting: Internal Medicine

## 2017-10-22 DIAGNOSIS — H40053 Ocular hypertension, bilateral: Secondary | ICD-10-CM | POA: Diagnosis not present

## 2017-10-26 DIAGNOSIS — G43719 Chronic migraine without aura, intractable, without status migrainosus: Secondary | ICD-10-CM | POA: Diagnosis not present

## 2017-10-26 DIAGNOSIS — M8589 Other specified disorders of bone density and structure, multiple sites: Secondary | ICD-10-CM | POA: Diagnosis not present

## 2017-10-26 DIAGNOSIS — G43019 Migraine without aura, intractable, without status migrainosus: Secondary | ICD-10-CM | POA: Diagnosis not present

## 2017-12-21 ENCOUNTER — Ambulatory Visit (AMBULATORY_SURGERY_CENTER): Payer: Self-pay | Admitting: *Deleted

## 2017-12-21 ENCOUNTER — Other Ambulatory Visit: Payer: Self-pay

## 2017-12-21 VITALS — Ht 64.0 in | Wt 134.8 lb

## 2017-12-21 DIAGNOSIS — Z1211 Encounter for screening for malignant neoplasm of colon: Secondary | ICD-10-CM

## 2017-12-21 MED ORDER — SUPREP BOWEL PREP KIT 17.5-3.13-1.6 GM/177ML PO SOLN: 1.0000 | Freq: Once | ORAL | 0 refills | Status: AC

## 2017-12-21 NOTE — Progress Notes (Signed)
Patient denies any allergies to egg or soy products. Patient denies complications with anesthesia/sedation.  Patient denies oxygen use at home and denies diet medications. Patient denied information on colonoscopy.

## 2017-12-22 ENCOUNTER — Encounter: Payer: Self-pay | Admitting: Internal Medicine

## 2018-01-04 ENCOUNTER — Ambulatory Visit (AMBULATORY_SURGERY_CENTER): Payer: Medicare HMO | Admitting: Internal Medicine

## 2018-01-04 ENCOUNTER — Encounter: Payer: Self-pay | Admitting: Internal Medicine

## 2018-01-04 VITALS — BP 97/50 | HR 59 | Temp 98.6°F | Resp 21 | Ht 64.0 in | Wt 134.0 lb

## 2018-01-04 DIAGNOSIS — D125 Benign neoplasm of sigmoid colon: Secondary | ICD-10-CM

## 2018-01-04 DIAGNOSIS — Z1211 Encounter for screening for malignant neoplasm of colon: Secondary | ICD-10-CM

## 2018-01-04 DIAGNOSIS — Z86010 Personal history of colonic polyps: Secondary | ICD-10-CM | POA: Diagnosis not present

## 2018-01-04 DIAGNOSIS — K635 Polyp of colon: Secondary | ICD-10-CM

## 2018-01-04 DIAGNOSIS — D122 Benign neoplasm of ascending colon: Secondary | ICD-10-CM | POA: Diagnosis not present

## 2018-01-04 MED ORDER — SODIUM CHLORIDE 0.9 % IV SOLN: 500.0000 mL | Freq: Once | INTRAVENOUS | Status: DC

## 2018-01-04 NOTE — Progress Notes (Signed)
Report given to PACU, vss 

## 2018-01-04 NOTE — Progress Notes (Signed)
Called to room to assist during endoscopic procedure.  Patient ID and intended procedure confirmed with present staff. Received instructions for my participation in the procedure from the performing physician.  

## 2018-01-04 NOTE — Progress Notes (Addendum)
Pt has metal rod with screws in most of her spine. Maw   Pt's states no medical or surgical changes since previsit or office visit.

## 2018-01-04 NOTE — Op Note (Signed)
[facility] Patient Name: Dana Massey Procedure Date: 01/04/2018 11:23 AM MRN: [id number] Endoscopist: Docia Chuck. Henrene Pastor , MD Age: 72 Referring MD:  Date of Birth: 15-Feb-1946 Gender: Female Account #: [id number] Procedure:                Colonoscopy, With cold snare polypectomy x 2 Indications:              Screening for colorectal malignant neoplasm.                            Negative previous examinations 2001, 2009 Medicines:                Monitored Anesthesia Care Procedure:                Pre-Anesthesia Assessment:                           - Prior to the procedure, a History and Physical                            was performed, and patient medications and                            allergies were reviewed. The patient's tolerance of                            previous anesthesia was also reviewed. The risks                            and benefits of the procedure and the sedation                            options and risks were discussed with the patient.                            All questions were answered, and informed consent                            was obtained. Prior Anticoagulants: The patient has                            taken no previous anticoagulant or antiplatelet                            agents. After reviewing the risks and benefits, the                            patient was deemed in satisfactory condition to                            undergo the procedure.                           After obtaining informed consent, the colonoscope  was passed under direct vision. Throughout the                            procedure, the patient's blood pressure, pulse, and                            oxygen saturations were monitored continuously. The                            Model CF-HQ190L [id number]) scope was introduced                            through the anus and advanced to the the cecum,   identified by appendiceal orifice and ileocecal                            valve. The ileocecal valve, appendiceal orifice,                            and rectum were photographed. The quality of the                            bowel preparation was excellent. The colonoscopy                            was performed without difficulty. The patient                            tolerated the procedure well. The bowel preparation                            used was SUPREP. Scope In: 11:32:54 AM Scope Out: 11:49:05 AM Scope Withdrawal Time: 0 hours 11 minutes 58 seconds  Total Procedure Duration: 0 hours 16 minutes 11 seconds  Findings:                 Two sessile polyps were found in the sigmoid colon                            and proximal ascending colon. The polyps were 3 to                            8 mm in size. These polyps were removed with a cold                            snare. Resection and retrieval were complete.                           Multiple diverticula were found in the left colon.                           The exam was otherwise without abnormality on  direct and retroflexion views. Complications:            No immediate complications. Estimated blood loss:                            None. Estimated Blood Loss:     Estimated blood loss: none. Impression:               - Two 3 to 8 mm polyps in the sigmoid colon and in                            the proximal ascending colon, removed with a cold                            snare. Resected and retrieved.                           - Diverticulosis in the left colon.                           - The examination was otherwise normal on direct                            and retroflexion views. Recommendation:           - Repeat colonoscopy in 5 years for surveillance.                           - Patient has a contact number available for                            emergencies. The signs and symptoms of  potential                            delayed complications were discussed with the                            patient. Return to normal activities tomorrow.                            Written discharge instructions were provided to the                            patient.                           - Resume previous diet.                           - Continue present medications.                           - Await pathology results. Docia Chuck. Henrene Pastor, MD 01/04/2018 11:55:10 AM This report has been signed electronically.

## 2018-01-04 NOTE — Patient Instructions (Signed)
YOU HAD AN ENDOSCOPIC PROCEDURE TODAY: Refer to the procedure report and other information in the discharge instructions given to you for any specific questions about what was found during the examination. If this information does not answer your questions, please call [place] office at [phone] to clarify.   **Handouts given on polyps and diverticulosis**   YOU SHOULD EXPECT: Some feelings of bloating in the abdomen. Passage of more gas than usual. Walking can help get rid of the air that was put into your GI tract during the procedure and reduce the bloating. If you had a lower endoscopy (such as a colonoscopy or flexible sigmoidoscopy) you may notice spotting of blood in your stool or on the toilet paper. Some abdominal soreness may be present for a day or two, also.  DIET: Your first meal following the procedure should be a light meal and then it is ok to progress to your normal diet. A half-sandwich or bowl of soup is an example of a good first meal. Heavy or fried foods are harder to digest and may make you feel nauseous or bloated. Drink plenty of fluids but you should avoid alcoholic beverages for 24 hours. If you had a esophageal dilation, please see attached instructions for diet.    ACTIVITY: Your care partner should take you home directly after the procedure. You should plan to take it easy, moving slowly for the rest of the day. You can resume normal activity the day after the procedure however YOU SHOULD NOT DRIVE, use power tools, machinery or perform tasks that involve climbing or major physical exertion for 24 hours (because of the sedation medicines used during the test).   SYMPTOMS TO REPORT IMMEDIATELY: A gastroenterologist can be reached at any hour. Please call [phone]  for any of the following symptoms:  Following lower endoscopy (colonoscopy, flexible sigmoidoscopy) Excessive amounts of blood in the stool  Significant tenderness, worsening of abdominal pains  Swelling  of the abdomen that is new, acute  Fever of 100 or higher   FOLLOW UP:  If any biopsies were taken you will be contacted by phone or by letter within the next 1-3 weeks. Call [phone]  if you have not heard about the biopsies in 3 weeks.  Please also call with any specific questions about appointments or follow up tests.

## 2018-01-05 ENCOUNTER — Telehealth: Payer: Self-pay | Admitting: *Deleted

## 2018-01-05 NOTE — Telephone Encounter (Signed)
  Follow up Call-  Call back number 01/04/2018  Post procedure Call Back phone  # [phone] hm  Permission to leave phone message Yes  Some recent data might be hidden     Patient questions:  Do you have a fever, pain , or abdominal swelling? No. Pain Score  0 *  Have you tolerated food without any problems? Yes.    Have you been able to return to your normal activities? Yes.    Do you have any questions about your discharge instructions: Diet   No. Medications  No. Follow up visit  No.  Do you have questions or concerns about your Care? No.  Actions: * If pain score is 4 or above: No action needed, pain <4.

## 2018-01-06 ENCOUNTER — Other Ambulatory Visit: Payer: Self-pay | Admitting: Family Medicine

## 2018-01-06 ENCOUNTER — Other Ambulatory Visit: Payer: Self-pay | Admitting: Internal Medicine

## 2018-01-10 ENCOUNTER — Encounter: Payer: Self-pay | Admitting: Internal Medicine

## 2018-01-17 NOTE — Progress Notes (Signed)
Subjective:   Dana Massey is a 72 y.o. female who presents for Medicare Annual (Subsequent) preventive examination.  Review of Systems: No ROS.  Medicare Wellness Visit. Additional risk factors are reflected in the social history. Cardiac Risk Factors include: advanced age (>82men, >56 women) Sleep patterns: Melatonin 6 mg nightly. Sleeps 8-9hrs.  Home Safety/Smoke Alarms: Feels safe in home. Smoke alarms in place.  Living environment; residence and Firearm Safety: Lives in 2 story home with husband.   Female:       Mammo-  Per pt 08/13/17 [facility]     Dexa scan- per pt 09/13/17 with [facility]     CCS-due 2024 Objective:     Vitals: BP 118/76 (BP Location: Left Arm, Patient Position: Sitting, Cuff Size: Normal)   Pulse 66   Ht 5\' 4"  (1.626 m)   Wt 133 lb 6.4 oz (60.5 kg)   LMP 09/13/2000   SpO2 97%   BMI 22.90 kg/m   Body mass index is 22.9 kg/m.  Advanced Directives 01/19/2018 01/05/2017 12/25/2015 12/25/2015 12/18/2013  Does Patient Have a Medical Advance Directive? Yes Yes Yes Yes Patient does not have advance directive  Type of Advance Directive [place];Living will [place];Living will [place];Living will [place];Living will -  Does patient want to make changes to medical advance directive? - - No - Patient declined No - Patient declined -  Copy of [place] in Chart? No - copy requested No - copy requested No - copy requested No - copy requested -    Tobacco Social History   Tobacco Use  Smoking Status Never Smoker  Smokeless Tobacco Never Used     Counseling given: Not Answered   Clinical Intake: Pain : No/denies pain    Past Medical History:  Diagnosis Date  . Cataract    left eye surgery to remove, right eye just watching currently  . Constipation   . Depression   . Diverticulosis of colon   . Glaucoma    ocular htn bilateral eyes - uses drops   .  History of blood transfusion 1994   with spinal fusion surgery  . IBS (irritable bowel syndrome)   . Migraine headache    resolved, no current problem  . Mitral valve prolapse    patient denies this dx  . Neuromuscular disorder ([place])    essential tremor hands  . Osteopenia   . PAC (premature atrial contraction)    on metoprolol  . Palpitations   . Scoliosis   . Shoulder pain    resolved, no loonger a problem  . Toxic effect of venom(989.5)   . Venous insufficiency    Past Surgical History:  Procedure Laterality Date  . BACK SURGERY  1994   scoliosis at [facility] spine center  . CATARACT EXTRACTION Left 04/2010  . COLONOSCOPY  2009   [facility]- Normal  . ROTATOR CUFF REPAIR  2006  . WISDOM TOOTH EXTRACTION     Family History  Problem Relation Age of Onset  . Diabetes Mother   . Colon cancer Neg Hx   . Rectal cancer Neg Hx   . Stomach cancer Neg Hx   . Esophageal cancer Neg Hx    Social History   Socioeconomic History  . Marital status: Married    Spouse name: [name]  . Number of children: 2  . Years of education: Not on file  . Highest education level: Not on file  Occupational History  . Not on file  Social Needs  . Financial resource strain: Not on file  . Food insecurity:    Worry: Not on file    Inability: Not on file  . Transportation needs:    Medical: Not on file    Non-medical: Not on file  Tobacco Use  . Smoking status: Never Smoker  . Smokeless tobacco: Never Used  Substance and Sexual Activity  . Alcohol use: Yes    Comment: rarely  . Drug use: No  . Sexual activity: Yes    Partners: Male    Birth control/protection: Post-menopausal    Comment: husband vasectomy  Lifestyle  . Physical activity:    Days per week: Not on file    Minutes per session: Not on file  . Stress: Not on file  Relationships  . Social connections:    Talks on phone: Not on file    Gets together: Not on file    Attends religious service: Not on file    Active member of  club or organization: Not on file    Attends meetings of clubs or organizations: Not on file    Relationship status: Not on file  Other Topics Concern  . Not on file  Social History Narrative   Does not have a living will.   Desires CPR but would not want prolonged life support.          Outpatient Encounter Medications as of 01/19/2018  Medication Sig  . Acetaminophen 500 MG coapsule   . aspirin EC 81 MG tablet Take 81 mg by mouth daily.  . Calcium-Vitamin D-Vitamin K (VIACTIV) 500-100-40 MG-UNT-MCG CHEW Chew 1 capsule by mouth daily.    . cycloSPORINE (RESTASIS) 0.05 % ophthalmic emulsion Place 1 drop into both eyes 2 (two) times daily.    . dorzolamide (TRUSOPT) 2 % ophthalmic solution 1 drop 2 (two) times daily.  . fish oil-omega-3 fatty acids 1000 MG capsule Take 2 g by mouth daily.    [name] latanoprost (XALATAN) 0.005 % ophthalmic solution Place 1 drop into both eyes at bedtime.  [name] MELATONIN PO Take 6 mg by mouth at bedtime as needed.   . metoprolol succinate (TOPROL-XL) 25 MG 24 hr tablet TAKE 1 TABLET DAILY WITH OR IMMEDIATELY FOLLOWING A MEAL. Please make yearly appt with Dr. Lovena Le for September. 1st attempt  . Multiple Vitamins-Minerals (MULTIVITAMIN WITH MINERALS) tablet Take 1 tablet by mouth daily.    . polyethylene glycol powder (MIRALAX) powder Take 17 g by mouth daily as needed.   . sertraline (ZOLOFT) 50 MG tablet TAKE 1 AND 1/2 TABLETS EVERY DAY   No facility-administered encounter medications on file as of 01/19/2018.     Activities of Daily Living In your present state of health, do you have any difficulty performing the following activities: 01/19/2018  Hearing? N  Vision? N  Difficulty concentrating or making decisions? N  Walking or climbing stairs? N  Dressing or bathing? N  Doing errands, shopping? N  Preparing Food and eating ? N  Using the Toilet? N  In the past six months, have you accidently leaked urine? N  Do you have problems with loss of bowel control?  N  Managing your Medications? N  Managing your Finances? N  Housekeeping or managing your Housekeeping? N  Some recent data might be hidden    Patient Care Team: Lucille Passy, MD as PCP - General (Family Medicine) Evans Lance, MD as Consulting Physician (Cardiology) Nicki Reaper,  Wille Glaser, OD as Referring Physician (Optometry) Orie Rout, MD as Consulting Physician (Neurology) Glennie Isle PA-C as Physician Assistant (Dermatology) Vladimir Crofts, MD as Consulting Physician (Neurology)    Assessment:   This is a routine wellness examination for Elandra. Physical assessment deferred to PCP.  Exercise Activities and Dietary recommendations Current Exercise Habits: Structured exercise class, Type of exercise: calisthenics;stretching;walking, Time (Minutes): 60, Frequency (Times/Week): 3, Weekly Exercise (Minutes/Week): 180, Intensity: Mild, Exercise limited by: None identified   Diet (meal preparation, eat out, water intake, caffeinated beverages, dairy products, fruits and vegetables): in general, a "healthy" diet  , well balanced   Goals    . Maintain active lifestyle.       Fall Risk Fall Risk  01/19/2018 01/05/2017 12/25/2015 12/25/2015 12/24/2014  Falls in the past year? No No No No No    Depression Screen PHQ 2/9 Scores 01/19/2018 01/05/2017 12/25/2015 12/25/2015  PHQ - 2 Score 0 0 6 6  PHQ- 9 Score - - 16 16     Cognitive Function Ad8 score reviewed for issues:  Issues making decisions:no  Less interest in hobbies / activities:no  Repeats questions, stories (family complaining):no  Trouble using ordinary gadgets (microwave, computer, phone):no  Forgets the month or year: no  Mismanaging finances: no  Remembering appts:no  Daily problems with thinking and/or memory:no Ad8 score is=0     MMSE - Mini Mental State Exam 01/05/2017 12/25/2015  Orientation to time 5 5  Orientation to Place 5 5  Registration 3 3  Attention/ Calculation 0 0  Recall 3 3  Language-  name 2 objects 0 0  Language- repeat 1 1  Language- follow 3 step command 3 3  Language- read & follow direction 0 0  Write a sentence 0 0  Copy design 0 0  Total score 20 20        Immunization History  Administered Date(s) Administered  . Influenza Split 05/26/2011, 03/22/2012  . Influenza Whole 03/07/2008, 03/29/2009, 03/14/2010  . Influenza,inj,Quad PF,6+ Mos 03/21/2013, 02/21/2014, 03/28/2015, 04/15/2016, 03/19/2017  . Pneumococcal Conjugate-13 12/18/2013  . Pneumococcal Polysaccharide-23 11/20/2011  . Td 12/24/2014  . Zoster 12/18/2013   Screening Tests Health Maintenance  Topic Date Due  . INFLUENZA VACCINE  01/13/2018  . MAMMOGRAM  09/09/2018  . COLONOSCOPY  01/05/2023  . TETANUS/TDAP  12/23/2024  . DEXA SCAN  Completed  . Hepatitis C Screening  Completed  . PNA vac Low Risk Adult  Completed       Plan:    Please schedule your next medicare wellness visit with me in 1 yr.  Follow up with Dr.Aron   I have personally reviewed and noted the following in the patient's chart:   . Medical and social history . Use of alcohol, tobacco or illicit drugs  . Current medications and supplements . Functional ability and status . Nutritional status . Physical activity . Advanced directives . List of other physicians . Hospitalizations, surgeries, and ER visits in previous 12 months . Vitals . Screenings to include cognitive, depression, and falls . Referrals and appointments  In addition, I have reviewed and discussed with patient certain preventive protocols, quality metrics, and best practice recommendations. A written personalized care plan for preventive services as well as general preventive health recommendations were provided to patient.     Shela Nevin, [place]  01/19/2018

## 2018-01-18 ENCOUNTER — Other Ambulatory Visit: Payer: Self-pay

## 2018-01-18 DIAGNOSIS — F32A Depression, unspecified: Secondary | ICD-10-CM

## 2018-01-18 DIAGNOSIS — E785 Hyperlipidemia, unspecified: Secondary | ICD-10-CM

## 2018-01-18 DIAGNOSIS — F329 Major depressive disorder, single episode, unspecified: Secondary | ICD-10-CM

## 2018-01-19 ENCOUNTER — Encounter: Payer: Self-pay | Admitting: Family Medicine

## 2018-01-19 ENCOUNTER — Ambulatory Visit (INDEPENDENT_AMBULATORY_CARE_PROVIDER_SITE_OTHER): Payer: Medicare HMO | Admitting: Behavioral Health

## 2018-01-19 ENCOUNTER — Ambulatory Visit (INDEPENDENT_AMBULATORY_CARE_PROVIDER_SITE_OTHER): Payer: Medicare HMO | Admitting: Family Medicine

## 2018-01-19 ENCOUNTER — Encounter: Payer: Self-pay | Admitting: Behavioral Health

## 2018-01-19 VITALS — BP 118/76 | HR 66 | Temp 98.6°F | Ht 64.0 in | Wt 133.4 lb

## 2018-01-19 VITALS — BP 118/76 | HR 66 | Ht 64.0 in | Wt 133.4 lb

## 2018-01-19 DIAGNOSIS — F32A Depression, unspecified: Secondary | ICD-10-CM

## 2018-01-19 DIAGNOSIS — Z0000 Encounter for general adult medical examination without abnormal findings: Secondary | ICD-10-CM

## 2018-01-19 DIAGNOSIS — C4491 Basal cell carcinoma of skin, unspecified: Secondary | ICD-10-CM

## 2018-01-19 DIAGNOSIS — E785 Hyperlipidemia, unspecified: Secondary | ICD-10-CM | POA: Diagnosis not present

## 2018-01-19 DIAGNOSIS — Z01419 Encounter for gynecological examination (general) (routine) without abnormal findings: Secondary | ICD-10-CM

## 2018-01-19 DIAGNOSIS — F329 Major depressive disorder, single episode, unspecified: Secondary | ICD-10-CM | POA: Diagnosis not present

## 2018-01-19 LAB — CBC WITH DIFFERENTIAL/PLATELET
BASOS ABS: 0.1 10*3/uL (ref 0.0–0.1)
Basophils Relative: 1.2 % (ref 0.0–3.0)
EOS ABS: 0.1 10*3/uL (ref 0.0–0.7)
Eosinophils Relative: 2.1 % (ref 0.0–5.0)
HCT: 45.9 % (ref 36.0–46.0)
HEMOGLOBIN: 15.2 g/dL — AB (ref 12.0–15.0)
LYMPHS ABS: 0.9 10*3/uL (ref 0.7–4.0)
Lymphocytes Relative: 18.5 % (ref 12.0–46.0)
MCHC: 33.2 g/dL (ref 30.0–36.0)
MCV: 94.8 fl (ref 78.0–100.0)
MONO ABS: 0.4 10*3/uL (ref 0.1–1.0)
Monocytes Relative: 8.2 % (ref 3.0–12.0)
NEUTROS PCT: 70 % (ref 43.0–77.0)
Neutro Abs: 3.6 10*3/uL (ref 1.4–7.7)
Platelets: 186 10*3/uL (ref 150.0–400.0)
RBC: 4.84 Mil/uL (ref 3.87–5.11)
RDW: 14 % (ref 11.5–15.5)
WBC: 5.1 10*3/uL (ref 4.0–10.5)

## 2018-01-19 LAB — LIPID PANEL
Cholesterol: 209 mg/dL — ABNORMAL HIGH (ref 0–200)
HDL: 55.2 mg/dL (ref >39.00)
LDL Cholesterol: 132 mg/dL — ABNORMAL HIGH (ref 0–99)
NONHDL: 153.32
Total CHOL/HDL Ratio: 4
Triglycerides: 105 mg/dL (ref 0.0–149.0)
VLDL: 21 mg/dL (ref 0.0–40.0)

## 2018-01-19 LAB — COMPREHENSIVE METABOLIC PANEL
ALBUMIN: 4.1 g/dL (ref 3.5–5.2)
ALK PHOS: 57 U/L (ref 39–117)
ALT: 10 U/L (ref 0–35)
AST: 17 U/L (ref 0–37)
BILIRUBIN TOTAL: 0.6 mg/dL (ref 0.2–1.2)
BUN: 15 mg/dL (ref 6–23)
CO2: 33 mEq/L — ABNORMAL HIGH (ref 19–32)
CREATININE: 1.03 mg/dL (ref 0.40–1.20)
Calcium: 9.9 mg/dL (ref 8.4–10.5)
Chloride: 102 mEq/L (ref 96–112)
GFR: 56.09 mL/min — ABNORMAL LOW (ref >60.00)
GLUCOSE: 97 mg/dL (ref 70–99)
POTASSIUM: 5 meq/L (ref 3.5–5.1)
SODIUM: 141 meq/L (ref 135–145)
Total Protein: 7.3 g/dL (ref 6.0–8.3)

## 2018-01-19 LAB — TSH: TSH: 3.32 u[IU]/mL (ref 0.35–4.50)

## 2018-01-19 NOTE — Assessment & Plan Note (Signed)
Followed by dermatology yearly

## 2018-01-19 NOTE — Assessment & Plan Note (Signed)
Well controlled on current dose of zoloft. No changes made today.

## 2018-01-19 NOTE — Progress Notes (Signed)
I reviewed health advisor's note, was available for consultation, and agree with documentation and plan.  

## 2018-01-19 NOTE — Progress Notes (Signed)
Subjective:   Patient ID: Dana Massey, female    DOB: Feb 10, 1946, 72 y.o.   MRN: [id number]  Dana Massey is a pleasant 72 y.o. year old female who presents to clinic today with Follow-up (Pt last mamm. 08/2017 , pt agreed to have [facility] send in results. )  and follow up of chronic medical conditions on 01/19/2018  HPI:  Had annual medicare wellness visit with Glenard Haring, RN this morning. Notes reviewed.  No concerns.  Still going to silver sneakers three times per year. Health Maintenance  Topic Date Due  . INFLUENZA VACCINE  01/13/2018  . MAMMOGRAM  09/09/2018  . COLONOSCOPY  01/05/2023  . TETANUS/TDAP  12/23/2024  . DEXA SCAN  Completed  . Hepatitis C Screening  Completed  . PNA vac Low Risk Adult  Completed     Anxiety/depression- Much improved with zoloft last year-75 mg daily.  Migraines are much better.  Has actually decreased zonegran to 100 mg once daily.  Saw Dermatologist, [place] dermatologist in 08/2017- h/o BCC. Only a precancerous lesion frozen.     Lab Results  Component Value Date   CHOL 197 01/05/2017   HDL 58.70 01/05/2017   LDLCALC 123 (H) 01/05/2017   LDLDIRECT 126.9 11/25/2012   TRIG 78.0 01/05/2017   CHOLHDL 3 01/05/2017   Lab Results  Component Value Date   NA 133 (L) 12/25/2015   K 4.4 12/25/2015   CL 108 12/25/2015   CO2 29 12/25/2015   Lab Results  Component Value Date   WBC 5.7 12/25/2015   HGB 14.9 12/25/2015   HCT 44.7 12/25/2015   MCV 96.5 12/25/2015   PLT 178.0 12/25/2015   Lab Results  Component Value Date   TSH 1.12 12/25/2015    Current Outpatient Medications on File Prior to Visit  Medication Sig Dispense Refill  . Acetaminophen 500 MG coapsule     . aspirin EC 81 MG tablet Take 81 mg by mouth daily.    . Calcium-Vitamin D-Vitamin K (VIACTIV) 500-100-40 MG-UNT-MCG CHEW Chew 1 capsule by mouth daily.      . cycloSPORINE (RESTASIS) 0.05 % ophthalmic emulsion Place 1 drop into both eyes 2 (two) times daily.        . dorzolamide (TRUSOPT) 2 % ophthalmic solution 1 drop 2 (two) times daily.    . fish oil-omega-3 fatty acids 1000 MG capsule Take 2 g by mouth daily.      [name] latanoprost (XALATAN) 0.005 % ophthalmic solution Place 1 drop into both eyes at bedtime.    [name] MELATONIN PO Take 6 mg by mouth at bedtime as needed.     . metoprolol succinate (TOPROL-XL) 25 MG 24 hr tablet TAKE 1 TABLET DAILY WITH OR IMMEDIATELY FOLLOWING A MEAL. Please make yearly appt with Dr. Lovena Le for September. 1st attempt 90 tablet 0  . Multiple Vitamins-Minerals (MULTIVITAMIN WITH MINERALS) tablet Take 1 tablet by mouth daily.      . polyethylene glycol powder (MIRALAX) powder Take 17 g by mouth daily as needed.     . sertraline (ZOLOFT) 50 MG tablet TAKE 1 AND 1/2 TABLETS EVERY DAY 135 tablet 0   No current facility-administered medications on file prior to visit.     Allergies  Allergen Reactions  . Wasp Venom Anaphylaxis    Has EpiPen Has EpiPen    Past Medical History:  Diagnosis Date  . Cataract    left eye surgery to remove, right eye just watching currently  . Constipation   .  Depression   . Diverticulosis of colon   . Glaucoma    ocular htn bilateral eyes - uses drops   . History of blood transfusion 1994   with spinal fusion surgery  . IBS (irritable bowel syndrome)   . Migraine headache    resolved, no current problem  . Mitral valve prolapse    patient denies this dx  . Neuromuscular disorder ([place])    essential tremor hands  . Osteopenia   . PAC (premature atrial contraction)    on metoprolol  . Palpitations   . Scoliosis   . Shoulder pain    resolved, no loonger a problem  . Toxic effect of venom(989.5)   . Venous insufficiency     Past Surgical History:  Procedure Laterality Date  . BACK SURGERY  1994   scoliosis at [facility] spine center  . CATARACT EXTRACTION Left 04/2010  . COLONOSCOPY  2009   [facility]- Normal  . ROTATOR CUFF REPAIR  2006  . WISDOM TOOTH EXTRACTION      Family History   Problem Relation Age of Onset  . Diabetes Mother   . Colon cancer Neg Hx   . Rectal cancer Neg Hx   . Stomach cancer Neg Hx   . Esophageal cancer Neg Hx     Social History   Socioeconomic History  . Marital status: Married    Spouse name: Dana Massey  . Number of children: 2  . Years of education: Not on file  . Highest education level: Not on file  Occupational History  . Not on file  Social Needs  . Financial resource strain: Not on file  . Food insecurity:    Worry: Not on file    Inability: Not on file  . Transportation needs:    Medical: Not on file    Non-medical: Not on file  Tobacco Use  . Smoking status: Never Smoker  . Smokeless tobacco: Never Used  Substance and Sexual Activity  . Alcohol use: Yes    Comment: rarely  . Drug use: No  . Sexual activity: Yes    Partners: Male    Birth control/protection: Post-menopausal    Comment: husband vasectomy  Lifestyle  . Physical activity:    Days per week: Not on file    Minutes per session: Not on file  . Stress: Not on file  Relationships  . Social connections:    Talks on phone: Not on file    Gets together: Not on file    Attends religious service: Not on file    Active member of club or organization: Not on file    Attends meetings of clubs or organizations: Not on file    Relationship status: Not on file  . Intimate partner violence:    Fear of current or ex partner: Not on file    Emotionally abused: Not on file    Physically abused: Not on file    Forced sexual activity: Not on file  Other Topics Concern  . Not on file  Social History Narrative   Does not have a living will.   Desires CPR but would not want prolonged life support.         The PMH, PSH, Social History, Family History, Medications, and allergies have been reviewed in [facility], and have been updated if relevant.   Review of Systems  Constitutional: Negative.   HENT: Negative.   Respiratory: Negative.   Cardiovascular: Negative.    Gastrointestinal: Negative.   Endocrine: Negative.  Genitourinary: Negative.   Musculoskeletal: Negative.   Skin: Negative.   Allergic/Immunologic: Negative.   Neurological: Negative.   Hematological: Negative.   Psychiatric/Behavioral: Negative.   All other systems reviewed and are negative.      Objective:    BP 118/76 (BP Location: Left Arm, Patient Position: Sitting, Cuff Size: Normal)   Pulse 66   Temp 98.6 F (37 C) (Oral)   Ht _0  (1.626 m)   Wt 133 lb 6.4 oz (60.5 kg)   LMP 09/13/2000   SpO2 97%   BMI 22.90 kg/m   BP Readings from Last 3 Encounters:  01/19/18 118/76  01/19/18 118/76  01/04/18 (!) 97/50   Wt Readings from Last 3 Encounters:  01/19/18 133 lb 6.4 oz (60.5 kg)  01/19/18 133 lb 6.4 oz (60.5 kg)  01/04/18 134 lb (60.8 kg)    Physical Exam   General:  Well-developed,well-nourished,in no acute distress; alert,appropriate and cooperative throughout examination Head:  normocephalic and atraumatic.   Eyes:  vision grossly intact, PERRL Ears:  R ear normal and L ear normal externally, TMs clear bilaterally Nose:  no external deformity.   Mouth:  good dentition.   Neck:  No deformities, masses, or tenderness noted. Breasts:  No mass, nodules, thickening, tenderness, bulging, retraction, inflamation, nipple discharge or skin changes noted.   Lungs:  Normal respiratory effort, chest expands symmetrically. Lungs are clear to auscultation, no crackles or wheezes. Heart:  Normal rate and regular rhythm. S1 and S2 normal without gallop, murmur, click, rub or other extra sounds. Abdomen:  Bowel sounds positive,abdomen soft and non-tender without masses, organomegaly or hernias noted. Msk:  No deformity or scoliosis noted of thoracic or lumbar spine.   Extremities:  No clubbing, cyanosis, edema, or deformity noted with normal full range of motion of all joints.   Neurologic:  alert & oriented X3 and gait normal.   Skin:  Intact without suspicious lesions  or rashes Cervical Nodes:  No lymphadenopathy noted Axillary Nodes:  No palpable lymphadenopathy Psych:  Cognition and judgment appear intact. Alert and cooperative with normal attention span and concentration. No apparent delusions, illusions, hallucinations       Assessment & Plan:   Depression, unspecified depression type - Plan: TSH  Hyperlipidemia, unspecified hyperlipidemia type - Plan: TSH, CBC w/Diff, Comp Met (CMET), Lipid Profile No follow-ups on file.

## 2018-01-19 NOTE — Assessment & Plan Note (Signed)
Reviewed preventive care protocols, scheduled due services, and updated immunizations Discussed nutrition, exercise, diet, and healthy lifestyle.  

## 2018-01-19 NOTE — Patient Instructions (Signed)
Please schedule your next medicare wellness visit with me in 1 yr.  Follow up with Dr.Aron    Dana Massey , Thank you for taking time to come for your Medicare Wellness Visit. I appreciate your ongoing commitment to your health goals. Please review the following plan we discussed and let me know if I can assist you in the future.   These are the goals we discussed: Goals    . Maintain active lifestyle.       This is a list of the screening recommended for you and due dates:  Health Maintenance  Topic Date Due  . Flu Shot  01/13/2018  . Mammogram  09/09/2018  . Colon Cancer Screening  01/05/2023  . Tetanus Vaccine  12/23/2024  . DEXA scan (bone density measurement)  Completed  .  Hepatitis C: One time screening is recommended by Center for Disease Control  (CDC) for  adults born from [birth year] through 1964.   Completed  . Pneumonia vaccines  Completed    Preventive Care 29 Years and Older, Female Preventive care refers to lifestyle choices and visits with your health care provider that can promote health and wellness. What does preventive care include?  A yearly physical exam. This is also called an annual well check.  Dental exams once or twice a year.  Routine eye exams. Ask your health care provider how often you should have your eyes checked.  Personal lifestyle choices, including: ? Daily care of your teeth and gums. ? Regular physical activity. ? Eating a healthy diet. ? Avoiding tobacco and drug use. ? Limiting alcohol use. ? Practicing safe sex. ? Taking low-dose aspirin every day. ? Taking vitamin and mineral supplements as recommended by your health care provider. What happens during an annual well check? The services and screenings done by your health care provider during your annual well check will depend on your age, overall health, lifestyle risk factors, and family history of disease. Counseling Your health care provider may ask you questions about  your:  Alcohol use.  Tobacco use.  Drug use.  Emotional well-being.  Home and relationship well-being.  Sexual activity.  Eating habits.  History of falls.  Memory and ability to understand (cognition).  Work and work Statistician.  Reproductive health.  Screening You may have the following tests or measurements:  Height, weight, and BMI.  Blood pressure.  Lipid and cholesterol levels. These may be checked every 5 years, or more frequently if you are over 52 years old.  Skin check.  Lung cancer screening. You may have this screening every year starting at age 78 if you have a 30-pack-year history of smoking and currently smoke or have quit within the past 15 years.  Fecal occult blood test (FOBT) of the stool. You may have this test every year starting at age 61.  Flexible sigmoidoscopy or colonoscopy. You may have a sigmoidoscopy every 5 years or a colonoscopy every 10 years starting at age 17.  Hepatitis C blood test.  Hepatitis B blood test.  Sexually transmitted disease (STD) testing.  Diabetes screening. This is done by checking your blood sugar (glucose) after you have not eaten for a while (fasting). You may have this done every 1-3 years.  Bone density scan. This is done to screen for osteoporosis. You may have this done starting at age 1.  Mammogram. This may be done every 1-2 years. Talk to your health care provider about how often you should have regular mammograms.  Talk  with your health care provider about your test results, treatment options, and if necessary, the need for more tests. Vaccines Your health care provider may recommend certain vaccines, such as:  Influenza vaccine. This is recommended every year.  Tetanus, diphtheria, and acellular pertussis (Tdap, Td) vaccine. You may need a Td booster every 10 years.  Varicella vaccine. You may need this if you have not been vaccinated.  Zoster vaccine. You may need this after age  66.  Measles, mumps, and rubella (MMR) vaccine. You may need at least one dose of MMR if you were born in 1957 or later. You may also need a second dose.  Pneumococcal 13-valent conjugate (PCV13) vaccine. One dose is recommended after age 65.  Pneumococcal polysaccharide (PPSV23) vaccine. One dose is recommended after age 69.  Meningococcal vaccine. You may need this if you have certain conditions.  Hepatitis A vaccine. You may need this if you have certain conditions or if you travel or work in places where you may be exposed to hepatitis A.  Hepatitis B vaccine. You may need this if you have certain conditions or if you travel or work in places where you may be exposed to hepatitis B.  Haemophilus influenzae type b (Hib) vaccine. You may need this if you have certain conditions.  Talk to your health care provider about which screenings and vaccines you need and how often you need them. This information is not intended to replace advice given to you by your health care provider. Make sure you discuss any questions you have with your health care provider. Document Released: 06/28/2015 Document Revised: 02/19/2016 Document Reviewed: 04/02/2015 Elsevier Interactive Patient Education  [employer].

## 2018-01-19 NOTE — Patient Instructions (Signed)
Great to see you. I will call you with your lab results from today and you can view them online.   

## 2018-03-12 ENCOUNTER — Encounter: Payer: Self-pay | Admitting: Family Medicine

## 2018-03-14 DIAGNOSIS — G250 Essential tremor: Secondary | ICD-10-CM | POA: Diagnosis not present

## 2018-03-21 DIAGNOSIS — H40013 Open angle with borderline findings, low risk, bilateral: Secondary | ICD-10-CM | POA: Diagnosis not present

## 2018-05-28 ENCOUNTER — Other Ambulatory Visit: Payer: Self-pay | Admitting: Family Medicine

## 2018-05-30 MED ORDER — SERTRALINE HCL 50 MG PO TABS: 50.0000 mg | ORAL_TABLET | Freq: Every day | ORAL | 0 refills | Status: DC

## 2018-07-20 ENCOUNTER — Other Ambulatory Visit: Payer: Self-pay | Admitting: Internal Medicine

## 2018-07-20 MED ORDER — METOPROLOL SUCCINATE ER 25 MG PO TB24
ORAL_TABLET | ORAL | 0 refills | Status: DC
Start: 2018-07-20 — End: 2018-08-11

## 2018-07-27 ENCOUNTER — Encounter: Payer: Self-pay | Admitting: Internal Medicine

## 2018-08-11 ENCOUNTER — Ambulatory Visit: Payer: Medicare HMO | Admitting: Internal Medicine

## 2018-08-11 ENCOUNTER — Encounter: Payer: Self-pay | Admitting: Internal Medicine

## 2018-08-11 VITALS — BP 110/74 | HR 62 | Ht 64.0 in | Wt 139.4 lb

## 2018-08-11 DIAGNOSIS — R002 Palpitations: Secondary | ICD-10-CM | POA: Diagnosis not present

## 2018-08-11 MED ORDER — METOPROLOL SUCCINATE ER 25 MG PO TB24: ORAL_TABLET | ORAL | 3 refills | Status: DC

## 2018-08-11 NOTE — Patient Instructions (Signed)

## 2018-08-11 NOTE — Progress Notes (Signed)
HPI Dana Massey returns today for ongoing evaluation and management of her atrial arrhythmias, PACs and nonsustained atrial tachycardia. She is a very pleasant 73 year old woman with a history of the above problems, who has done well in the interim. She is exercising regularly. No chest pain or sob. No syncope. No edema. No caffeine. She notes that when she exercises her heart rate increases but then slows back down. However, when she is at rest she will sometimes notice palpitations lasting for a few seconds but never longer.  Allergies  Allergen Reactions  . Wasp Venom Anaphylaxis    Has EpiPen Has EpiPen     Current Outpatient Medications  Medication Sig Dispense Refill  . Acetaminophen 500 MG coapsule     . aspirin EC 81 MG tablet Take 81 mg by mouth daily.    . Calcium-Vitamin D-Vitamin K (VIACTIV) 500-100-40 MG-UNT-MCG CHEW Chew 1 capsule by mouth daily.      . cycloSPORINE (RESTASIS) 0.05 % ophthalmic emulsion Place 1 drop into both eyes 2 (two) times daily.      . dorzolamide (TRUSOPT) 2 % ophthalmic solution 1 drop 2 (two) times daily.    . fish oil-omega-3 fatty acids 1000 MG capsule Take 2 g by mouth daily.      [name] latanoprost (XALATAN) 0.005 % ophthalmic solution Place 1 drop into both eyes at bedtime.    [name] MELATONIN PO Take 6 mg by mouth at bedtime as needed.     . metoprolol succinate (TOPROL-XL) 25 MG 24 hr tablet TAKE 1 TABLET DAILY WITH OR IMMEDIATELY FOLLOWING A MEAL. Please make overdue appt with Dr. Lovena Le before anymore refills. 2nd attempt 15 tablet 0  . Multiple Vitamins-Minerals (MULTIVITAMIN WITH MINERALS) tablet Take 1 tablet by mouth daily.      . polyethylene glycol powder (MIRALAX) powder Take 17 g by mouth daily as needed.     . sertraline (ZOLOFT) 50 MG tablet Take 1 tablet (50 mg total) by mouth daily. 135 tablet 0   No current facility-administered medications for this visit.      Past Medical History:  Diagnosis Date  . Cataract    left eye  surgery to remove, right eye just watching currently  . Constipation   . Depression   . Diverticulosis of colon   . Glaucoma    ocular htn bilateral eyes - uses drops   . History of blood transfusion 1994   with spinal fusion surgery  . IBS (irritable bowel syndrome)   . Migraine headache    resolved, no current problem  . Mitral valve prolapse    patient denies this dx  . Neuromuscular disorder ([place])    essential tremor hands  . Osteopenia   . PAC (premature atrial contraction)    on metoprolol  . Palpitations   . Scoliosis   . Shoulder pain    resolved, no loonger a problem  . Toxic effect of venom(989.5)   . Venous insufficiency     ROS:   All systems reviewed and negative except as noted in the HPI.   Past Surgical History:  Procedure Laterality Date  . BACK SURGERY  1994   scoliosis at [facility]  . CATARACT EXTRACTION Left 04/2010  . COLONOSCOPY  2009   [facility]- Normal  . ROTATOR CUFF REPAIR  2006  . WISDOM TOOTH EXTRACTION       Family History  Problem Relation Age of Onset  . Diabetes Mother   . Colon  cancer Neg Hx   . Rectal cancer Neg Hx   . Stomach cancer Neg Hx   . Esophageal cancer Neg Hx      Social History   Socioeconomic History  . Marital status: Married    Spouse name: [name]  . Number of children: 2  . Years of education: Not on file  . Highest education level: Not on file  Occupational History  . Not on file  Social Needs  . Financial resource strain: Not on file  . Food insecurity:    Worry: Not on file    Inability: Not on file  . Transportation needs:    Medical: Not on file    Non-medical: Not on file  Tobacco Use  . Smoking status: Never Smoker  . Smokeless tobacco: Never Used  Substance and Sexual Activity  . Alcohol use: Yes    Comment: rarely  . Drug use: No  . Sexual activity: Yes    Partners: Male    Birth control/protection: Post-menopausal    Comment: husband vasectomy  Lifestyle  . Physical  activity:    Days per week: Not on file    Minutes per session: Not on file  . Stress: Not on file  Relationships  . Social connections:    Talks on phone: Not on file    Gets together: Not on file    Attends religious service: Not on file    Active member of club or organization: Not on file    Attends meetings of clubs or organizations: Not on file    Relationship status: Not on file  . Intimate partner violence:    Fear of current or ex partner: Not on file    Emotionally abused: Not on file    Physically abused: Not on file    Forced sexual activity: Not on file  Other Topics Concern  . Not on file  Social History Narrative   Does not have a living will.   Desires CPR but would not want prolonged life support.           BP 110/74   Pulse 62   Ht 5\' 4"  (1.626 m)   Wt 139 lb 6.4 oz (63.2 kg)   LMP 09/13/2000   SpO2 91%   BMI 23.93 kg/m   Physical Exam:  Well appearing NAD HEENT: Unremarkable Neck:  No JVD, no thyromegally Lymphatics:  No adenopathy Back:  No CVA tenderness Lungs:  Clear with no wheezes HEART:  Regular rate rhythm, no murmurs, no rubs, no clicks Abd:  soft, positive bowel sounds, no organomegally, no rebound, no guarding Ext:  2 plus pulses, no edema, no cyanosis, no clubbing Skin:  No rashes no nodules Neuro:  CN II through XII intact, motor grossly intact  EKG - nsr with T wave abnormality  Assess/Plan: 1. Palpitations - her symptoms are mostly controlled. We discussed giving her a script for low dose short aciting lopressor but I do not think she is having enough arrhythmia to justify. I spent 15 minutes including 50% face to face time with the patient.  Mikle Bosworth.D.

## 2018-08-21 ENCOUNTER — Other Ambulatory Visit: Payer: Self-pay | Admitting: Family Medicine

## 2018-11-08 ENCOUNTER — Other Ambulatory Visit: Payer: Self-pay | Admitting: Family Medicine

## 2018-11-23 DIAGNOSIS — Z1231 Encounter for screening mammogram for malignant neoplasm of breast: Secondary | ICD-10-CM | POA: Diagnosis not present

## 2018-11-23 LAB — HM MAMMOGRAPHY

## 2018-11-30 ENCOUNTER — Encounter: Payer: Self-pay | Admitting: Family Medicine

## 2018-11-30 NOTE — Progress Notes (Signed)
[facility]/thx dmf 

## 2018-12-01 DIAGNOSIS — H5210 Myopia, unspecified eye: Secondary | ICD-10-CM | POA: Diagnosis not present

## 2018-12-06 DIAGNOSIS — L570 Actinic keratosis: Secondary | ICD-10-CM | POA: Diagnosis not present

## 2018-12-06 DIAGNOSIS — L814 Other melanin hyperpigmentation: Secondary | ICD-10-CM | POA: Diagnosis not present

## 2018-12-06 DIAGNOSIS — Z85828 Personal history of other malignant neoplasm of skin: Secondary | ICD-10-CM | POA: Diagnosis not present

## 2018-12-06 DIAGNOSIS — D1801 Hemangioma of skin and subcutaneous tissue: Secondary | ICD-10-CM | POA: Diagnosis not present

## 2018-12-06 DIAGNOSIS — L821 Other seborrheic keratosis: Secondary | ICD-10-CM | POA: Diagnosis not present

## 2018-12-06 DIAGNOSIS — D225 Melanocytic nevi of trunk: Secondary | ICD-10-CM | POA: Diagnosis not present

## 2019-01-06 ENCOUNTER — Other Ambulatory Visit: Payer: Self-pay

## 2019-01-06 MED ORDER — SERTRALINE HCL 50 MG PO TABS: 50.0000 mg | ORAL_TABLET | Freq: Every day | ORAL | 0 refills | Status: DC

## 2019-02-07 ENCOUNTER — Telehealth: Payer: Self-pay

## 2019-02-07 DIAGNOSIS — H4010X1 Unspecified open-angle glaucoma, mild stage: Secondary | ICD-10-CM | POA: Insufficient documentation

## 2019-02-07 NOTE — Telephone Encounter (Signed)
Questions for Screening COVID-19  Symptom onset:n/a  Travel or Contacts: no During this illness, did/does the patient experience any of the following symptoms? Fever >100.55F []   Yes [x]   No []   Unknown Subjective fever (felt feverish) []   Yes [x]   No []   Unknown Chills []   Yes [x]   No []   Unknown Muscle aches (myalgia) []   Yes [x]   No []   Unknown Runny nose (rhinorrhea) []   Yes [x]   No []   Unknown Sore throat []   Yes [x]   No []   Unknown Cough (new onset or worsening of chronic cough) []   Yes [x]   No []   Unknown Shortness of breath (dyspnea) []   Yes [x]   No []   Unknown Nausea or vomiting []   Yes [x]   No []   Unknown Headache []   Yes [x]   No []   Unknown Abdominal pain  []   Yes [x]   No []   Unknown Diarrhea (?3 loose/looser than normal stools/24hr period) []   Yes [x]   No []   Unknown Other, specify:  Patient risk factors: Smoker? []   Current []   Former []   Never If female, currently pregnant? []   Yes []   No  Patient Active Problem List   Diagnosis Date Noted  . Essential tremor 05/20/2016  . Well woman exam 12/25/2015  . Depression 12/25/2015  . BCC (basal cell carcinoma of skin) 07/29/2015  . Reflex sympathetic dystrophy of lower extremity 06/05/2014  . Migraine headache 10/10/2007  . GLAUCOMA 10/10/2007  . MITRAL VALVE PROLAPSE 10/10/2007  . Venous (peripheral) insufficiency 10/10/2007  . Diverticulosis of large intestine 10/10/2007  . IRRITABLE BOWEL SYNDROME 10/10/2007  . Disorder of bone and cartilage 10/10/2007  . SCOLIOSIS 10/10/2007    Plan:  []   High risk for COVID-19 with red flags go to ED (with CP, SOB, weak/lightheaded, or fever > 101.5). Call ahead.  []   High risk for COVID-19 but stable. Inform provider and coordinate time for [facility] visit.   []   No red flags but URI signs or symptoms okay for [facility] visit.

## 2019-02-07 NOTE — Progress Notes (Signed)
Subjective:   JOCELLYN ZEPEDA is a 73 y.o. female who presents for Medicare Annual (Subsequent) preventive examination.  Review of Systems:  Cardiac Risk Factors include: advanced age (>22men, >33 women) Sleep patterns: Takes Melatonin 6mg . Sleeps well.  Home Safety/Smoke Alarms: Feels safe in home. Smoke alarms in place.  Lives in 2 story home with husband. Master on 1 st. No issue w/ stairs. Walk-in shower.   Female:     Mammo- 11/23/18  Dexa scan- 10/26/17 [facility]- due 2024. Eye- [place]. Last exam 11/2018  Objective:     Vitals: BP 124/78 (BP Location: Left Arm, Patient Position: Sitting, Cuff Size: Normal)   Temp 98.3 F (36.8 C) (Oral)   Ht 5\' 4"  (1.626 m)   Wt 135 lb 3.2 oz (61.3 kg)   LMP 09/13/2000   SpO2 95%   BMI 23.21 kg/m   Body mass index is 23.21 kg/m.  Advanced Directives 02/08/2019 01/19/2018 01/05/2017 12/25/2015 12/25/2015 12/18/2013  Does Patient Have a Medical Advance Directive? Yes Yes Yes Yes Yes Patient does not have advance directive  Type of Advance Directive - [place];Living will [place];Living will [place];Living will [place];Living will -  Does patient want to make changes to medical advance directive? No - Patient declined - - No - Patient declined No - Patient declined -  Copy of [place] in Chart? - No - copy requested No - copy requested No - copy requested No - copy requested -    Tobacco Social History   Tobacco Use  Smoking Status Never Smoker  Smokeless Tobacco Never Used     Counseling given: Not Answered   Clinical Intake: Pain : No/denies pain    Past Medical History:  Diagnosis Date  . Cataract    left eye surgery to remove, right eye just watching currently  . Constipation   . Depression   . Diverticulosis of colon   . Glaucoma    ocular htn bilateral eyes - uses drops   . History of blood transfusion 1994   with  spinal fusion surgery  . IBS (irritable bowel syndrome)   . Migraine headache    resolved, no current problem  . Mitral valve prolapse    patient denies this dx  . Neuromuscular disorder ([place])    essential tremor hands  . Osteopenia   . PAC (premature atrial contraction)    on metoprolol  . Palpitations   . Scoliosis   . Shoulder pain    resolved, no loonger a problem  . Toxic effect of venom(989.5)   . Venous insufficiency    Past Surgical History:  Procedure Laterality Date  . BACK SURGERY  1994   scoliosis at [facility] spine center  . CATARACT EXTRACTION Left 04/2010  . COLONOSCOPY  2009   [place]- Normal  . ROTATOR CUFF REPAIR  2006  . WISDOM TOOTH EXTRACTION     Family History  Problem Relation Age of Onset  . Diabetes Mother   . Colon cancer Neg Hx   . Rectal cancer Neg Hx   . Stomach cancer Neg Hx   . Esophageal cancer Neg Hx    Social History   Socioeconomic History  . Marital status: Married    Spouse name: [name]  . Number of children: 2  . Years of education: Not on file  . Highest education level: Not on file  Occupational History  . Not on file  Social Needs  . Financial resource strain: Not on file  . Food insecurity    Worry: Not on file    Inability: Not on file  . Transportation needs    Medical: Not on file    Non-medical: Not on file  Tobacco Use  . Smoking status: Never Smoker  . Smokeless tobacco: Never Used  Substance and Sexual Activity  . Alcohol use: Yes    Comment: rarely  . Drug use: No  . Sexual activity: Yes    Partners: Male    Birth control/protection: Post-menopausal    Comment: husband vasectomy  Lifestyle  . Physical activity    Days per week: Not on file    Minutes per session: Not on file  . Stress: Not on file  Relationships  . Social Herbalist on phone: Not on file    Gets together: Not on file    Attends religious service: Not on file    Active member of club or organization: Not on file    Attends  meetings of clubs or organizations: Not on file    Relationship status: Not on file  Other Topics Concern  . Not on file  Social History Narrative   Does not have a living will.   Desires CPR but would not want prolonged life support.          Outpatient Encounter Medications as of 02/08/2019  Medication Sig  . Acetaminophen 500 MG coapsule   . aspirin EC 81 MG tablet Take 81 mg by mouth daily.  . Calcium-Vitamin D-Vitamin K (VIACTIV) 500-100-40 MG-UNT-MCG CHEW Chew 1 capsule by mouth daily.    . cycloSPORINE (RESTASIS) 0.05 % ophthalmic emulsion Place 1 drop into both eyes 2 (two) times daily.    . dorzolamide (TRUSOPT) 2 % ophthalmic solution 1 drop 2 (two) times daily.  . fish oil-omega-3 fatty acids 1000 MG capsule Take 2 g by mouth daily.    [name] latanoprost (XALATAN) 0.005 % ophthalmic solution Place 1 drop into both eyes at bedtime.  [name] MELATONIN PO Take 6 mg by mouth at bedtime as needed.   . metoprolol succinate (TOPROL-XL) 25 MG 24 hr tablet TAKE 1 TABLET DAILY WITH OR IMMEDIATELY FOLLOWING A MEAL.  . Multiple Vitamins-Minerals (MULTIVITAMIN WITH MINERALS) tablet Take 1 tablet by mouth daily.    . polyethylene glycol powder (MIRALAX) powder Take 17 g by mouth daily as needed.   . sertraline (ZOLOFT) 50 MG tablet Take 1 tablet (50 mg total) by mouth daily. Must sched appt in August   No facility-administered encounter medications on file as of 02/08/2019.     Activities of Daily Living In your present state of health, do you have any difficulty performing the following activities: 02/08/2019  Hearing? N  Vision? N  Difficulty concentrating or making decisions? N  Walking or climbing stairs? N  Dressing or bathing? N  Doing errands, shopping? N  Preparing Food and eating ? N  Using the Toilet? N  In the past six months, have you accidently leaked urine? N  Do you have problems with loss of bowel control? N  Managing your Medications? N  Managing your Finances? N   Housekeeping or managing your Housekeeping? N  Some recent data might be hidden    Patient Care Team: Lucille Passy, MD as PCP - General (Family Medicine) Evans Lance, MD as Consulting Physician (Cardiology) Macarthur Critchley, [place] as Referring Physician (Optometry) Orie Rout, MD as Consulting Physician (  Neurology) Glennie Isle, PA-C as Physician Assistant (Dermatology) Vladimir Crofts, MD as Consulting Physician (Neurology)    Assessment:   This is a routine wellness examination for Abia. Physical assessment deferred to PCP.  Exercise Activities and Dietary recommendations Current Exercise Habits: Home exercise routine, Time (Minutes): 45, Frequency (Times/Week): 3, Weekly Exercise (Minutes/Week): 135, Intensity: Moderate, Exercise limited by: None identified Diet (meal preparation, eat out, water intake, caffeinated beverages, dairy products, fruits and vegetables): in general, a "healthy" diet  , well balanced  Goals    . Increase physical activity     Starting 01/05/2017, I will continue to exercise for at least 45 min 3 days per week.     . Maintain active lifestyle.       Fall Risk Fall Risk  02/08/2019 01/19/2018 01/05/2017 12/25/2015 12/25/2015  Falls in the past year? 0 No No No No    Depression Screen PHQ 2/9 Scores 02/08/2019 01/19/2018 01/05/2017 12/25/2015  PHQ - 2 Score 0 0 0 6  PHQ- 9 Score - - - 16     Cognitive Function  MMSE - Mini Mental State Exam 01/05/2017 12/25/2015  Orientation to time 5 5  Orientation to Place 5 5  Registration 3 3  Attention/ Calculation 0 0  Recall 3 3  Language- name 2 objects 0 0  Language- repeat 1 1  Language- follow 3 step command 3 3  Language- read & follow direction 0 0  Write a sentence 0 0  Copy design 0 0  Total score 20 20     6CIT Screen 02/08/2019  What Year? 0 points  What month? 0 points  What time? 0 points  Count back from 20 0 points  Months in reverse 0 points  Repeat phrase 0 points  Total Score 0     Immunization History  Administered Date(s) Administered  . Influenza Split 05/26/2011, 03/22/2012  . Influenza Whole 03/07/2008, 03/29/2009, 03/14/2010  . Influenza,inj,Quad PF,6+ Mos 03/21/2013, 02/21/2014, 03/28/2015, 04/15/2016, 03/19/2017  . Pneumococcal Conjugate-13 12/18/2013  . Pneumococcal Polysaccharide-23 11/20/2011  . Td 12/24/2014  . Zoster 12/18/2013    Screening Tests Health Maintenance  Topic Date Due  . INFLUENZA VACCINE  01/14/2019  . DEXA SCAN  10/27/2019  . MAMMOGRAM  11/23/2019  . COLONOSCOPY  01/05/2023  . TETANUS/TDAP  12/23/2024  . Hepatitis C Screening  Completed  . PNA vac Low Risk Adult  Completed        Plan:   Keep up the great work!  See you next year!   I have personally reviewed and noted the following in the patient's chart:   . Medical and social history . Use of alcohol, tobacco or illicit drugs  . Current medications and supplements . Functional ability and status . Nutritional status . Physical activity . Advanced directives . List of other physicians . Hospitalizations, surgeries, and ER visits in previous 12 months . Vitals . Screenings to include cognitive, depression, and falls . Referrals and appointments  In addition, I have reviewed and discussed with patient certain preventive protocols, quality metrics, and best practice recommendations. A written personalized care plan for preventive services as well as general preventive health recommendations were provided to patient.     Naaman Plummer [place], [place]  02/08/2019

## 2019-02-07 NOTE — Progress Notes (Signed)
Subjective:   Patient ID: Dana Massey, female    DOB: May 11, 1946, 73 y.o.   MRN: [id number]  Dana Massey is a pleasant 73 y.o. year old female who presents to clinic today with Follow-up (no complaints/ wants flu shot)  and follow up of chronic medical conditions on 02/08/2019  HPI:  Had annual medicare wellness visit with Glenard Haring, RN this morning. Notes reviewed.    Still going  to silver sneakers three times per week now on zoom.  Doing well. Health Maintenance  Topic Date Due  . INFLUENZA VACCINE  01/14/2019  . DEXA SCAN  10/27/2019  . MAMMOGRAM  11/23/2019  . COLONOSCOPY  01/05/2023  . TETANUS/TDAP  12/23/2024  . Hepatitis C Screening  Completed  . PNA vac Low Risk Adult  Completed   HTN- doing well on Metoprolol XL- Saw cardiologist, Dr. Lovena Le in 07/2018. No chest pain, dizziness, no SOB.   Anxiety/depression- Much improved with zoloft 75 mg daily.  No flowsheet data found.   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 Macarthur Critchley.  Was last seen in 11/2017.  Migraines are much better.  She is no longer taking zonisamide. Cannot even remember when she had her last migraine.  Essential Tremor- last seen by neurology, Dr. Manuella Ghazi, on 03/14/2018- note reviewed.  Decided not to start medication as tremors were not bothersome enough to require treatment according to pt.  She has another appointment scheduled for next  month.  Saw Dermatologist, [place] [facility]ologist in- h/o [facility]. Was last seen in 11/2018  HLD-    Lab Results  Component Value Date   CHOL 209 (H) 01/19/2018   HDL 55.20 01/19/2018   LDLCALC 132 (H) 01/19/2018   LDLDIRECT 126.9 11/25/2012   TRIG 105.0 01/19/2018   CHOLHDL 4 01/19/2018   Lab Results  Component Value Date   NA 141 01/19/2018   K 5.0 01/19/2018   CL 102 01/19/2018   CO2 33 (H) 01/19/2018   Lab Results  Component Value Date   WBC 5.1 01/19/2018   HGB 15.2 (H) 01/19/2018   HCT 45.9 01/19/2018   MCV 94.8 01/19/2018   PLT 186.0 01/19/2018   Lab Results  Component Value Date   TSH 3.32 01/19/2018    Current Outpatient Medications on File Prior to Visit  Medication Sig Dispense Refill  . Acetaminophen 500 MG coapsule     . aspirin EC 81 MG tablet Take 81 mg by mouth daily.    . Calcium-Vitamin D-Vitamin K (VIACTIV) 500-100-40 MG-UNT-MCG CHEW Chew 1 capsule by mouth daily.      . cycloSPORINE (RESTASIS) 0.05 % ophthalmic emulsion Place 1 drop into both eyes 2 (two) times daily.      . dorzolamide (TRUSOPT) 2 % ophthalmic solution 1  drop 2 (two) times daily.    . fish oil-omega-3 fatty acids 1000 MG capsule Take 2 g by mouth daily.      [name] latanoprost (XALATAN) 0.005 % ophthalmic solution Place 1 drop into both eyes at bedtime.    [name] MELATONIN PO Take 6 mg by mouth at bedtime as needed.     . metoprolol succinate (TOPROL-XL) 25 MG 24 hr tablet TAKE 1 TABLET DAILY WITH OR IMMEDIATELY FOLLOWING A MEAL. 90 tablet 3  . Multiple Vitamins-Minerals (MULTIVITAMIN WITH MINERALS) tablet Take 1 tablet by mouth daily.      . polyethylene glycol powder (MIRALAX) powder Take 17 g by mouth daily as needed.     . sertraline (ZOLOFT) 50 MG tablet Take 1 tablet (50 mg total) by mouth daily. Must sched appt in August 90 tablet 0   No current facility-administered medications on file prior to visit.     Allergies  Allergen Reactions  . Wasp Venom Anaphylaxis    Has EpiPen Has  EpiPen    Past Medical History:  Diagnosis Date  . Cataract    left eye surgery to remove, right eye just watching currently  . Constipation   . Depression   . Diverticulosis of colon   . Glaucoma    ocular htn bilateral eyes - uses drops   . History of blood transfusion 1994   with spinal fusion surgery  . IBS (irritable bowel syndrome)   . Migraine headache    resolved, no current problem  . Mitral valve prolapse    patient denies this dx  . Neuromuscular disorder ([place])    essential tremor hands  . Osteopenia   . PAC (premature atrial contraction)    on metoprolol  . Palpitations   . Scoliosis   . Shoulder pain    resolved, no loonger a problem  . Toxic effect of venom(989.5)   . Venous insufficiency     Past Surgical History:  Procedure Laterality Date  . BACK SURGERY  1994   scoliosis at [facility]  . CATARACT EXTRACTION Left 04/2010  . COLONOSCOPY  2009   [place]- Normal  . ROTATOR CUFF REPAIR  2006  . WISDOM TOOTH EXTRACTION      Family History  Problem Relation Age of Onset  . Diabetes Mother   . Colon cancer Neg Hx   . Rectal cancer Neg Hx   . Stomach cancer Neg Hx   . Esophageal cancer Neg Hx     Social History   Socioeconomic History  . Marital status: Married    Spouse name: [name]  . Number of children: 2  . Years of education: Not on file  . Highest education level: Not on file  Occupational History  . Not on file  Social Needs  . Financial resource strain: Not on file  . Food insecurity    Worry: Not on file    Inability: Not on file  . Transportation needs    Medical: Not on file    Non-medical: Not on file  Tobacco Use  . Smoking status: Never Smoker  . Smokeless tobacco: Never Used  Substance and Sexual Activity  . Alcohol use: Yes    Comment: rarely  . Drug use: No  . Sexual activity: Yes    Partners: Male    Birth control/protection: Post-menopausal    Comment: husband vasectomy  Lifestyle  . Physical activity     Days per week: Not on file    Minutes per  session: Not on file  . Stress: Not on file  Relationships  . Social Herbalist on phone: Not on file    Gets together: Not on file    Attends religious service: Not on file    Active member of club or organization: Not on file    Attends meetings of clubs or organizations: Not on file    Relationship status: Not on file  . Intimate partner violence    Fear of current or ex partner: Not on file    Emotionally abused: Not on file    Physically abused: Not on file    Forced sexual activity: Not on file  Other Topics Concern  . Not on file  Social History Narrative   Does not have a living will.   Desires CPR but would not want prolonged life support.         The PMH, PSH, Social History, Family History, Medications, and allergies have been reviewed in [facility], and have been updated if relevant.   Review of Systems  Constitutional: Negative.   HENT: Negative.   Respiratory: Negative.   Cardiovascular: Negative.   Gastrointestinal: Negative.   Endocrine: Negative.   Genitourinary: Negative.   Musculoskeletal: Negative.   Skin: Negative.   Allergic/Immunologic: Negative.   Neurological: Negative.   Hematological: Negative.   Psychiatric/Behavioral: Negative.   All other systems reviewed and are negative.      Objective:    BP 124/78   Pulse 64   Temp 98.3 F (36.8 C) (Oral)   Ht 5\' 4"  (1.626 m)   Wt 135 lb 3.2 oz (61.3 kg)   LMP 09/13/2000   SpO2 95%   BMI 23.21 kg/m   BP Readings from Last 3 Encounters:  02/08/19 124/78  02/08/19 124/78  08/11/18 110/74   Wt Readings from Last 3 Encounters:  02/08/19 135 lb 3.2 oz (61.3 kg)  02/08/19 135 lb 3.2 oz (61.3 kg)  08/11/18 139 lb 6.4 oz (63.2 kg)    Physical Exam   General:  Well-developed,well-nourished,in no acute distress; alert,appropriate and cooperative throughout examination Head:  normocephalic and atraumatic.   Eyes:  vision grossly intact, PERRL  Ears:  R ear normal and L ear normal externally, TMs clear bilaterally Nose:  no external deformity.   Mouth:  good dentition.   Neck:  No deformities, masses, or tenderness noted. Lungs:  Normal respiratory effort, chest expands symmetrically. Lungs are clear to auscultation, no crackles or wheezes. Heart:  Normal rate and regular rhythm. S1 and S2 normal without gallop, murmur, click, rub or other extra sounds. Abdomen:  Bowel sounds positive,abdomen soft and non-tender without masses, organomegaly or hernias noted. Msk:  No deformity or scoliosis noted of thoracic or lumbar spine.   Extremities:  No clubbing, cyanosis, edema, or deformity noted with normal full range of motion of all joints.   Neurologic:  alert & oriented X3 and gait normal.   Skin:  Intact without suspicious lesions or rashes Cervical Nodes:  No lymphadenopathy noted Axillary Nodes:  No palpable lymphadenopathy Psych:  Cognition and judgment appear intact. Alert and cooperative with normal attention span and concentration. No apparent delusions, illusions, hallucinations       Assessment & Plan:   Essential tremor  Depression, unspecified depression type  Basal cell carcinoma (BCC), unspecified site  Hyperlipidemia, unspecified hyperlipidemia type - Plan: Comprehensive metabolic panel, Lipid panel, CBC with Differential/Platelet, TSH  Open-angle glaucoma of both eyes, mild stage, unspecified open-angle glaucoma type  Need for influenza vaccination - Plan: Flu Vaccine QUAD High Dose(Fluad) No follow-ups on file.

## 2019-02-07 NOTE — Patient Instructions (Signed)
Great to see you. I will call you with your lab results from today and you can view them online.   

## 2019-02-08 ENCOUNTER — Encounter: Payer: Self-pay | Admitting: Family Medicine

## 2019-02-08 ENCOUNTER — Ambulatory Visit (INDEPENDENT_AMBULATORY_CARE_PROVIDER_SITE_OTHER): Payer: Medicare HMO | Admitting: *Deleted

## 2019-02-08 ENCOUNTER — Ambulatory Visit (INDEPENDENT_AMBULATORY_CARE_PROVIDER_SITE_OTHER): Payer: Medicare HMO | Admitting: Family Medicine

## 2019-02-08 ENCOUNTER — Encounter: Payer: Self-pay | Admitting: *Deleted

## 2019-02-08 VITALS — BP 124/78 | Temp 98.3°F | Ht 64.0 in | Wt 135.2 lb

## 2019-02-08 VITALS — BP 124/78 | HR 64 | Temp 98.3°F | Ht 64.0 in | Wt 135.2 lb

## 2019-02-08 DIAGNOSIS — E785 Hyperlipidemia, unspecified: Secondary | ICD-10-CM | POA: Diagnosis not present

## 2019-02-08 DIAGNOSIS — F329 Major depressive disorder, single episode, unspecified: Secondary | ICD-10-CM | POA: Diagnosis not present

## 2019-02-08 DIAGNOSIS — Z23 Encounter for immunization: Secondary | ICD-10-CM

## 2019-02-08 DIAGNOSIS — G250 Essential tremor: Secondary | ICD-10-CM

## 2019-02-08 DIAGNOSIS — H4010X1 Unspecified open-angle glaucoma, mild stage: Secondary | ICD-10-CM

## 2019-02-08 DIAGNOSIS — G43901 Migraine, unspecified, not intractable, with status migrainosus: Secondary | ICD-10-CM

## 2019-02-08 DIAGNOSIS — M8580 Other specified disorders of bone density and structure, unspecified site: Secondary | ICD-10-CM | POA: Insufficient documentation

## 2019-02-08 DIAGNOSIS — R002 Palpitations: Secondary | ICD-10-CM | POA: Diagnosis not present

## 2019-02-08 DIAGNOSIS — M8589 Other specified disorders of bone density and structure, multiple sites: Secondary | ICD-10-CM | POA: Diagnosis not present

## 2019-02-08 DIAGNOSIS — Z0000 Encounter for general adult medical examination without abnormal findings: Secondary | ICD-10-CM

## 2019-02-08 DIAGNOSIS — C4491 Basal cell carcinoma of skin, unspecified: Secondary | ICD-10-CM | POA: Diagnosis not present

## 2019-02-08 DIAGNOSIS — F32A Depression, unspecified: Secondary | ICD-10-CM

## 2019-02-08 LAB — COMPREHENSIVE METABOLIC PANEL
ALT: 14 U/L (ref 0–35)
AST: 21 U/L (ref 0–37)
Albumin: 4.2 g/dL (ref 3.5–5.2)
Alkaline Phosphatase: 56 U/L (ref 39–117)
BUN: 19 mg/dL (ref 6–23)
CO2: 29 mEq/L (ref 19–32)
Calcium: 9.4 mg/dL (ref 8.4–10.5)
Chloride: 100 mEq/L (ref 96–112)
Creatinine, Ser: 0.99 mg/dL (ref 0.40–1.20)
GFR: 55.07 mL/min — ABNORMAL LOW (ref >60.00)
Glucose, Bld: 96 mg/dL (ref 70–99)
Potassium: 4.7 mEq/L (ref 3.5–5.1)
Sodium: 138 mEq/L (ref 135–145)
Total Bilirubin: 0.4 mg/dL (ref 0.2–1.2)
Total Protein: 7.4 g/dL (ref 6.0–8.3)

## 2019-02-08 LAB — LIPID PANEL
Cholesterol: 210 mg/dL — ABNORMAL HIGH (ref 0–200)
HDL: 55.1 mg/dL (ref >39.00)
LDL Cholesterol: 134 mg/dL — ABNORMAL HIGH (ref 0–99)
NonHDL: 154.57
Total CHOL/HDL Ratio: 4
Triglycerides: 101 mg/dL (ref 0.0–149.0)
VLDL: 20.2 mg/dL (ref 0.0–40.0)

## 2019-02-08 LAB — CBC WITH DIFFERENTIAL/PLATELET
Basophils Absolute: 0.1 10*3/uL (ref 0.0–0.1)
Basophils Relative: 1.1 % (ref 0.0–3.0)
Eosinophils Absolute: 0.1 10*3/uL (ref 0.0–0.7)
Eosinophils Relative: 2.1 % (ref 0.0–5.0)
HCT: 48.1 % — ABNORMAL HIGH (ref 36.0–46.0)
Hemoglobin: 15.8 g/dL — ABNORMAL HIGH (ref 12.0–15.0)
Lymphocytes Relative: 21.6 % (ref 12.0–46.0)
Lymphs Abs: 1.3 10*3/uL (ref 0.7–4.0)
MCHC: 32.8 g/dL (ref 30.0–36.0)
MCV: 97.4 fl (ref 78.0–100.0)
Monocytes Absolute: 0.4 10*3/uL (ref 0.1–1.0)
Monocytes Relative: 6.5 % (ref 3.0–12.0)
Neutro Abs: 4.1 10*3/uL (ref 1.4–7.7)
Neutrophils Relative %: 68.7 % (ref 43.0–77.0)
Platelets: 185 10*3/uL (ref 150.0–400.0)
RBC: 4.94 Mil/uL (ref 3.87–5.11)
RDW: 14.1 % (ref 11.5–15.5)
WBC: 6 10*3/uL (ref 4.0–10.5)

## 2019-02-08 LAB — VITAMIN D 25 HYDROXY (VIT D DEFICIENCY, FRACTURES): VITD: 63.48 ng/mL (ref 30.00–100.00)

## 2019-02-08 LAB — TSH: TSH: 2.21 u[IU]/mL (ref 0.35–4.50)

## 2019-02-08 MED ORDER — SERTRALINE HCL 50 MG PO TABS: 75.0000 mg | ORAL_TABLET | Freq: Every day | ORAL | 3 refills | Status: DC

## 2019-02-08 NOTE — Assessment & Plan Note (Signed)
Doing well on zoloft 75 mg daily. eRx refills sent. Depression screen [facility] 2/9 02/08/2019 01/19/2018 01/05/2017 12/25/2015 12/25/2015  Decreased Interest 0 0 0 3 3  Down, Depressed, Hopeless 0 0 0 3 3  PHQ - 2 Score 0 0 0 6 6  Altered sleeping - - - 3 3  Tired, decreased energy - - - 3 3  Change in appetite - - - 3 3  Feeling bad or failure about yourself  - - - 0 0  Trouble concentrating - - - 1 1  Moving slowly or fidgety/restless - - - 0 0  Suicidal thoughts - - - 0 0  PHQ-9 Score - - - 16 16  Difficult doing work/chores - - - Somewhat difficult Somewhat difficult

## 2019-02-08 NOTE — Assessment & Plan Note (Signed)
Followed by neuro, Dr. Manuella Ghazi.  Has an appt to see him again next month.  Does not feel she needs rxs at this time.

## 2019-02-08 NOTE — Patient Instructions (Signed)
Keep up the great work!  See you next year!   Dana Massey , Thank you for taking time to come for your Medicare Wellness Visit. I appreciate your ongoing commitment to your health goals. Please review the following plan we discussed and let me know if I can assist you in the future.   These are the goals we discussed: Goals    . Increase physical activity     Starting 01/05/2017, I will continue to exercise for at least 45 min 3 days per week.     . Maintain active lifestyle.       This is a list of the screening recommended for you and due dates:  Health Maintenance  Topic Date Due  . Flu Shot  01/14/2019  . DEXA scan (bone density measurement)  10/27/2019  . Mammogram  11/23/2019  . Colon Cancer Screening  01/05/2023  . Tetanus Vaccine  12/23/2024  .  Hepatitis C: One time screening is recommended by Center for Disease Control  (CDC) for  adults born from [birth year] through 1964.   Completed  . Pneumonia vaccines  Completed    Health Maintenance After Age 79 After age 61, you are at a higher risk for certain long-term diseases and infections as well as injuries from falls. Falls are a major cause of broken bones and head injuries in people who are older than age 46. Getting regular preventive care can help to keep you healthy and well. Preventive care includes getting regular testing and making lifestyle changes as recommended by your health care provider. Talk with your health care provider about:  Which screenings and tests you should have. A screening is a test that checks for a disease when you have no symptoms.  A diet and exercise plan that is right for you. What should I know about screenings and tests to prevent falls? Screening and testing are the best ways to find a health problem early. Early diagnosis and treatment give you the best chance of managing medical conditions that are common after age 26. Certain conditions and lifestyle choices may make you more likely to have a  fall. Your health care provider may recommend:  Regular vision checks. Poor vision and conditions such as cataracts can make you more likely to have a fall. If you wear glasses, make sure to get your prescription updated if your vision changes.  Medicine review. Work with your health care provider to regularly review all of the medicines you are taking, including over-the-counter medicines. Ask your health care provider about any side effects that may make you more likely to have a fall. Tell your health care provider if any medicines that you take make you feel dizzy or sleepy.  Osteoporosis screening. Osteoporosis is a condition that causes the bones to get weaker. This can make the bones weak and cause them to break more easily.  Blood pressure screening. Blood pressure changes and medicines to control blood pressure can make you feel dizzy.  Strength and balance checks. Your health care provider may recommend certain tests to check your strength and balance while standing, walking, or changing positions.  Foot health exam. Foot pain and numbness, as well as not wearing proper footwear, can make you more likely to have a fall.  Depression screening. You may be more likely to have a fall if you have a fear of falling, feel emotionally low, or feel unable to do activities that you used to do.  Alcohol use screening. Using too  much alcohol can affect your balance and may make you more likely to have a fall. What actions can I take to lower my risk of falls? General instructions  Talk with your health care provider about your risks for falling. Tell your health care provider if: ? You fall. Be sure to tell your health care provider about all falls, even ones that seem minor. ? You feel dizzy, sleepy, or off-balance.  Take over-the-counter and prescription medicines only as told by your health care provider. These include any supplements.  Eat a healthy diet and maintain a healthy weight. A  healthy diet includes low-fat dairy products, low-fat (lean) meats, and fiber from whole grains, beans, and lots of fruits and vegetables. Home safety  Remove any tripping hazards, such as rugs, cords, and clutter.  Install safety equipment such as grab bars in bathrooms and safety rails on stairs.  Keep rooms and walkways well-lit. Activity   Follow a regular exercise program to stay fit. This will help you maintain your balance. Ask your health care provider what types of exercise are appropriate for you.  If you need a cane or walker, use it as recommended by your health care provider.  Wear supportive shoes that have nonskid soles. Lifestyle  Do not drink alcohol if your health care provider tells you not to drink.  If you drink alcohol, limit how much you have: ? 0-1 drink a day for women. ? 0-2 drinks a day for men.  Be aware of how much alcohol is in your drink. In the [place] one drink equals one typical bottle of beer (12 oz), one-half glass of wine (5 oz), or one shot of hard liquor (1 oz).  Do not use any products that contain nicotine or tobacco, such as cigarettes and e-cigarettes. If you need help quitting, ask your health care provider. Summary  Having a healthy lifestyle and getting preventive care can help to protect your health and wellness after age 13.  Screening and testing are the best way to find a health problem early and help you avoid having a fall. Early diagnosis and treatment give you the best chance for managing medical conditions that are more common for people who are older than age 23.  Falls are a major cause of broken bones and head injuries in people who are older than age 18. Take precautions to prevent a fall at home.  Work with your health care provider to learn what changes you can make to improve your health and wellness and to prevent falls. This information is not intended to replace advice given to you by your health care provider. Make  sure you discuss any questions you have with your health care provider. Document Released: 04/14/2017 Document Revised: 09/22/2018 Document Reviewed: 04/14/2017 Elsevier Patient Education  2020 [employer].

## 2019-02-08 NOTE — Assessment & Plan Note (Signed)
Has not had a migraine in well over  Year.  No longer taking zomig.

## 2019-02-08 NOTE — Assessment & Plan Note (Signed)
Followed by optho, last seen in 11/2018.

## 2019-02-08 NOTE — Assessment & Plan Note (Signed)
Followed by Dr. Allyson Sabal.

## 2019-02-08 NOTE — Assessment & Plan Note (Addendum)
continue good calcium and vitamin d intake in diet and weight bearing exercise as she has been doing.  Due for follow up DEXA in 2021. Check calcium and Vitamin D today.

## 2019-02-08 NOTE — Assessment & Plan Note (Signed)
Well controlled on current dose of Toprol XL.  Followed by cardiology as well.

## 2019-02-12 ENCOUNTER — Other Ambulatory Visit: Payer: Self-pay | Admitting: Family Medicine

## 2019-02-12 DIAGNOSIS — D751 Secondary polycythemia: Secondary | ICD-10-CM

## 2019-02-22 ENCOUNTER — Encounter: Payer: Self-pay | Admitting: Family Medicine

## 2019-03-09 ENCOUNTER — Telehealth: Payer: Self-pay

## 2019-03-09 NOTE — Telephone Encounter (Signed)
During this illness, did/does the patient experience any of the following symptoms? Fever >100.4F []  Yes [x]  No []  Unknown Subjective fever (felt feverish) []  Yes [x]  No []  Unknown Chills []  Yes [x]  No []  Unknown Muscle aches (myalgia) []  Yes [x]  No []  Unknown Runny nose (rhinorrhea) []  Yes [x]  No []  Unknown Sore throat []  Yes [x]  No []  Unknown Cough (new onset or worsening of chronic cough) []  Yes [x]  No []  Unknown Shortness of breath (dyspnea) []  Yes [x]  No []  Unknown Nausea or vomiting []  Yes [x]  No []  Unknown Headache []  Yes [x]  No []  Unknown Abdominal pain  []  Yes [x]  No []  Unknown Diarrhea (?3 loose/looser than normal stools/24hr period) []  Yes []  No []  Unknown Other, specify:   

## 2019-03-10 ENCOUNTER — Other Ambulatory Visit: Payer: Self-pay

## 2019-03-10 ENCOUNTER — Other Ambulatory Visit (INDEPENDENT_AMBULATORY_CARE_PROVIDER_SITE_OTHER): Payer: Medicare HMO

## 2019-03-10 ENCOUNTER — Telehealth: Payer: Self-pay | Admitting: Family Medicine

## 2019-03-10 DIAGNOSIS — D751 Secondary polycythemia: Secondary | ICD-10-CM

## 2019-03-10 LAB — CBC WITH DIFFERENTIAL/PLATELET
Basophils Absolute: 0 10*3/uL (ref 0.0–0.1)
Basophils Relative: 1.2 % (ref 0.0–3.0)
Eosinophils Absolute: 0.2 10*3/uL (ref 0.0–0.7)
Eosinophils Relative: 5 % (ref 0.0–5.0)
HCT: 63.9 % (ref 36.0–46.0)
Hemoglobin: 21.1 g/dL (ref 12.0–15.0)
Lymphocytes Relative: 24.8 % (ref 12.0–46.0)
Lymphs Abs: 0.9 10*3/uL (ref 0.7–4.0)
MCHC: 33.1 g/dL (ref 30.0–36.0)
MCV: 97.3 fl (ref 78.0–100.0)
Monocytes Absolute: 0.2 10*3/uL (ref 0.1–1.0)
Monocytes Relative: 6.9 % (ref 3.0–12.0)
Neutro Abs: 2.2 10*3/uL (ref 1.4–7.7)
Neutrophils Relative %: 62.1 % (ref 43.0–77.0)
Platelets: 74 10*3/uL — ABNORMAL LOW (ref 150.0–400.0)
RBC: 6.53 Mil/uL — ABNORMAL HIGH (ref 3.87–5.11)
RDW: 13.9 % (ref 11.5–15.5)
WBC: 3.5 10*3/uL — ABNORMAL LOW (ref 4.0–10.5)

## 2019-03-10 LAB — URINALYSIS, ROUTINE W REFLEX MICROSCOPIC
Bilirubin Urine: NEGATIVE
Hgb urine dipstick: NEGATIVE
Ketones, ur: NEGATIVE
Nitrite: NEGATIVE
RBC / HPF: NONE SEEN (ref 0–?)
Specific Gravity, Urine: 1.015 (ref 1.000–1.030)
Total Protein, Urine: NEGATIVE
Urine Glucose: NEGATIVE
Urobilinogen, UA: 0.2 (ref 0.0–1.0)
pH: 8 (ref 5.0–8.0)

## 2019-03-10 NOTE — Telephone Encounter (Signed)
I spoke with pt. She is not currently having any symptoms, will proceed to the ED if any do start to occur.   Dr. Deborra Medina - FYI.

## 2019-03-10 NOTE — Telephone Encounter (Signed)
Critical result of Hgb 21.1 and Hct of 63.9.  These numbers are very high. Please contact patient as ask if she is having any symptoms of chest pain, headache, itching, abdominal fullness or distention, leg swelling or pain.  If she has or develops any of these symptoms she needs to go to the emergency room ASAP.  I have placed an urgent referral to hematology for further evaluation of this.

## 2019-03-13 ENCOUNTER — Encounter: Payer: Self-pay | Admitting: Oncology

## 2019-03-13 ENCOUNTER — Other Ambulatory Visit: Payer: Self-pay

## 2019-03-13 ENCOUNTER — Inpatient Hospital Stay: Payer: Medicare HMO | Attending: Oncology | Admitting: Oncology

## 2019-03-13 ENCOUNTER — Inpatient Hospital Stay: Payer: Medicare HMO

## 2019-03-13 VITALS — BP 126/74 | HR 81 | Temp 98.3°F | Wt 136.6 lb

## 2019-03-13 DIAGNOSIS — D696 Thrombocytopenia, unspecified: Secondary | ICD-10-CM | POA: Diagnosis not present

## 2019-03-13 DIAGNOSIS — F329 Major depressive disorder, single episode, unspecified: Secondary | ICD-10-CM | POA: Diagnosis not present

## 2019-03-13 DIAGNOSIS — D751 Secondary polycythemia: Secondary | ICD-10-CM | POA: Diagnosis not present

## 2019-03-13 DIAGNOSIS — I872 Venous insufficiency (chronic) (peripheral): Secondary | ICD-10-CM | POA: Diagnosis not present

## 2019-03-13 DIAGNOSIS — M8580 Other specified disorders of bone density and structure, unspecified site: Secondary | ICD-10-CM | POA: Insufficient documentation

## 2019-03-13 DIAGNOSIS — Z7982 Long term (current) use of aspirin: Secondary | ICD-10-CM | POA: Diagnosis not present

## 2019-03-13 DIAGNOSIS — Z79899 Other long term (current) drug therapy: Secondary | ICD-10-CM | POA: Insufficient documentation

## 2019-03-13 DIAGNOSIS — H409 Unspecified glaucoma: Secondary | ICD-10-CM | POA: Diagnosis not present

## 2019-03-13 DIAGNOSIS — I10 Essential (primary) hypertension: Secondary | ICD-10-CM | POA: Diagnosis not present

## 2019-03-13 LAB — CBC WITH DIFFERENTIAL/PLATELET
Abs Immature Granulocytes: 0.03 10*3/uL (ref 0.00–0.07)
Basophils Absolute: 0.1 10*3/uL (ref 0.0–0.1)
Basophils Relative: 2 %
Eosinophils Absolute: 0.1 10*3/uL (ref 0.0–0.5)
Eosinophils Relative: 3 %
HCT: 49.1 % — ABNORMAL HIGH (ref 36.0–46.0)
Hemoglobin: 16.4 g/dL — ABNORMAL HIGH (ref 12.0–15.0)
Immature Granulocytes: 1 %
Lymphocytes Relative: 21 %
Lymphs Abs: 1.1 10*3/uL (ref 0.7–4.0)
MCH: 32 pg (ref 26.0–34.0)
MCHC: 33.4 g/dL (ref 30.0–36.0)
MCV: 95.7 fL (ref 80.0–100.0)
Monocytes Absolute: 0.4 10*3/uL (ref 0.1–1.0)
Monocytes Relative: 8 %
Neutro Abs: 3.4 10*3/uL (ref 1.7–7.7)
Neutrophils Relative %: 65 %
Platelets: 194 10*3/uL (ref 150–400)
RBC: 5.13 MIL/uL — ABNORMAL HIGH (ref 3.87–5.11)
RDW: 13 % (ref 11.5–15.5)
WBC: 5.2 10*3/uL (ref 4.0–10.5)
nRBC: 0 % (ref 0.0–0.2)

## 2019-03-13 LAB — COMPREHENSIVE METABOLIC PANEL
ALT: 18 U/L (ref 0–44)
AST: 23 U/L (ref 15–41)
Albumin: 4.4 g/dL (ref 3.5–5.0)
Alkaline Phosphatase: 72 U/L (ref 38–126)
Anion gap: 11 (ref 5–15)
BUN: 23 mg/dL (ref 8–23)
CO2: 30 mmol/L (ref 22–32)
Calcium: 10.1 mg/dL (ref 8.9–10.3)
Chloride: 98 mmol/L (ref 98–111)
Creatinine, Ser: 0.95 mg/dL (ref 0.44–1.00)
GFR calc Af Amer: >60 mL/min (ref >60)
GFR calc non Af Amer: 60 mL/min — ABNORMAL LOW (ref >60)
Glucose, Bld: 80 mg/dL (ref 70–99)
Potassium: 4.4 mmol/L (ref 3.5–5.1)
Sodium: 139 mmol/L (ref 135–145)
Total Bilirubin: 0.6 mg/dL (ref 0.3–1.2)
Total Protein: 8.2 g/dL — ABNORMAL HIGH (ref 6.5–8.1)

## 2019-03-13 LAB — ERYTHROPOIETIN: Erythropoietin: 8.1 m[IU]/mL (ref 2.6–18.5)

## 2019-03-13 LAB — TECHNOLOGIST SMEAR REVIEW: Plt Morphology: ADEQUATE

## 2019-03-13 LAB — VITAMIN B12: Vitamin B-12: 687 pg/mL (ref 180–914)

## 2019-03-13 LAB — PATHOLOGIST SMEAR REVIEW

## 2019-03-13 LAB — FOLATE: Folate: 91.3 ng/mL (ref >5.9)

## 2019-03-13 NOTE — Progress Notes (Signed)
Hematology/Oncology follow up note [facility] Telephone:(336) [phone] Fax:(336) [phone]   Patient Care Team: Lucille Passy, MD as PCP - General (Family Medicine) Evans Lance, MD as Consulting Physician (Cardiology) Macarthur Critchley, [place] as Referring Physician (Optometry) Orie Rout, MD as Consulting Physician (Neurology) Glennie Isle, PA-C as Physician Assistant (Dermatology) Vladimir Crofts, MD as Consulting Physician (Neurology)  REFERRING PROVIDER: Luetta Nutting, DO  CHIEF COMPLAINTS/REASON FOR VISIT:  Evaluation of polycytosis  HISTORY OF PRESENTING ILLNESS:  Dana Massey is a 73 y.o. female who was seen in consultation at the request of Luetta Nutting, DO for evaluation of polycytosis/erythrocytosis Reviewed patient's recent lab work  03/10/2019 labs showed elevated hemoglobin at 21.1 with hematocrit of 63.9 total white count 3.5, platelet counts 74,000. Reviewed patient's previous blood work.  Anisocytosis is chronic Onset, duration since 2010 No aggravating or alleviating factors.  Most recent blood work seems to be in acute increase of her hemoglobin chronic baseline.  Associated signs or symptoms: Denies weight loss, fever, chills, fatigue, night sweats.   Context:  Smoking history: Never smoker. Denies any herbal supplementation. History of blood clots: Denies Daytime somnolence: Denies Family history of polycythemia: Denies  Patient has a history of vein insufficiency.  She has mild headache today.  Has a history of migraine.  No double vision, focal deficits, slurred speech she has noticed some right lower quadrant abdominal discomfort for the past few days.  No nausea vomiting  Review of Systems  Constitutional: Negative for appetite change, chills, fatigue and fever.  HENT:   Negative for hearing loss and voice change.   Eyes: Negative for eye problems.  Respiratory: Negative for chest tightness and cough.   Cardiovascular:  Negative for chest pain.  Gastrointestinal: Negative for abdominal distention, abdominal pain and blood in stool.  Endocrine: Negative for hot flashes.  Genitourinary: Negative for difficulty urinating and frequency.   Musculoskeletal: Negative for arthralgias.  Skin: Negative for itching and rash.  Neurological: Positive for headaches. Negative for extremity weakness.  Hematological: Negative for adenopathy.  Psychiatric/Behavioral: Negative for confusion.    MEDICAL HISTORY:  Past Medical History:  Diagnosis Date  . Cataract    left eye surgery to remove, right eye just watching currently  . Constipation   . Depression   . Diverticulosis of colon   . Glaucoma    ocular htn bilateral eyes - uses drops   . History of blood transfusion 1994   with spinal fusion surgery  . IBS (irritable bowel syndrome)   . Migraine headache    resolved, no current problem  . Mitral valve prolapse    patient denies this dx  . Neuromuscular disorder ([place])    essential tremor hands  . Osteopenia   . PAC (premature atrial contraction)    on metoprolol  . Palpitations   . Scoliosis   . Shoulder pain    resolved, no loonger a problem  . Toxic effect of venom(989.5)   . Venous insufficiency     SURGICAL HISTORY: Past Surgical History:  Procedure Laterality Date  . BACK SURGERY  1994   scoliosis at [facility] spine center  . CATARACT EXTRACTION Left 04/2010  . COLONOSCOPY  2009   [place]- Normal  . ROTATOR CUFF REPAIR  2006  . WISDOM TOOTH EXTRACTION      SOCIAL HISTORY: Social History   Socioeconomic History  . Marital status: Married    Spouse name: [name]  . Number of children: 2  . Years  of education: Not on file  . Highest education level: Not on file  Occupational History  . Not on file  Social Needs  . Financial resource strain: Not on file  . Food insecurity    Worry: Not on file    Inability: Not on file  . Transportation needs    Medical: Not on file    Non-medical: Not  on file  Tobacco Use  . Smoking status: Never Smoker  . Smokeless tobacco: Never Used  Substance and Sexual Activity  . Alcohol use: Yes    Comment: rarely  . Drug use: No  . Sexual activity: Yes    Partners: Male    Birth control/protection: Post-menopausal    Comment: husband vasectomy  Lifestyle  . Physical activity    Days per week: Not on file    Minutes per session: Not on file  . Stress: Not on file  Relationships  . Social Herbalist on phone: Not on file    Gets together: Not on file    Attends religious service: Not on file    Active member of club or organization: Not on file    Attends meetings of clubs or organizations: Not on file    Relationship status: Not on file  . Intimate partner violence    Fear of current or ex partner: Not on file    Emotionally abused: Not on file    Physically abused: Not on file    Forced sexual activity: Not on file  Other Topics Concern  . Not on file  Social History Narrative   Does not have a living will.   Desires CPR but would not want prolonged life support.          FAMILY HISTORY: Family History  Problem Relation Age of Onset  . Diabetes Mother   . Colon cancer Neg Hx   . Rectal cancer Neg Hx   . Stomach cancer Neg Hx   . Esophageal cancer Neg Hx     ALLERGIES:  is allergic to wasp venom.  MEDICATIONS:  Current Outpatient Medications  Medication Sig Dispense Refill  . Acetaminophen 500 MG coapsule     . aspirin EC 81 MG tablet Take 81 mg by mouth daily.    . Calcium-Vitamin D-Vitamin K (VIACTIV) 500-100-40 MG-UNT-MCG CHEW Chew 1 capsule by mouth daily.      . cycloSPORINE (RESTASIS) 0.05 % ophthalmic emulsion Place 1 drop into both eyes 2 (two) times daily.      . dorzolamide (TRUSOPT) 2 % ophthalmic solution 1 drop 2 (two) times daily.    . fish oil-omega-3 fatty acids 1000 MG capsule Take 2 g by mouth daily.      [name] latanoprost (XALATAN) 0.005 % ophthalmic solution Place 1 drop into both eyes at  bedtime.    [name] MELATONIN PO Take 6 mg by mouth at bedtime as needed.     . metoprolol succinate (TOPROL-XL) 25 MG 24 hr tablet TAKE 1 TABLET DAILY WITH OR IMMEDIATELY FOLLOWING A MEAL. 90 tablet 3  . Multiple Vitamins-Minerals (MULTIVITAMIN WITH MINERALS) tablet Take 1 tablet by mouth daily.      . polyethylene glycol powder (MIRALAX) powder Take 17 g by mouth daily as needed.     . sertraline (ZOLOFT) 50 MG tablet Take 1.5 tablets (75 mg total) by mouth daily. 135 tablet 3   No current facility-administered medications for this visit.      PHYSICAL EXAMINATION: ECOG PERFORMANCE STATUS: 0 -  Asymptomatic Vitals:   03/13/19 1059  BP: 126/74  Pulse: 81  Temp: 98.3 F (36.8 C)   Filed Weights   03/13/19 1059  Weight: 136 lb 9.6 oz (62 kg)    Physical Exam Constitutional:      General: She is not in acute distress. HENT:     Head: Normocephalic and atraumatic.  Eyes:     General: No scleral icterus.    Pupils: Pupils are equal, round, and reactive to light.  Neck:     Musculoskeletal: Normal range of motion and neck supple.  Cardiovascular:     Rate and Rhythm: Normal rate and regular rhythm.     Heart sounds: Normal heart sounds.  Pulmonary:     Effort: Pulmonary effort is normal. No respiratory distress.     Breath sounds: No wheezing.  Abdominal:     General: Bowel sounds are normal. There is no distension.     Palpations: Abdomen is soft. There is no mass.     Tenderness: There is no abdominal tenderness.  Musculoskeletal: Normal range of motion.        General: No deformity.  Skin:    General: Skin is warm and dry.     Findings: No erythema or rash.  Neurological:     Mental Status: She is alert and oriented to person, place, and time.     Cranial Nerves: No cranial nerve deficit.     Coordination: Coordination normal.  Psychiatric:        Behavior: Behavior normal.        Thought Content: Thought content normal.     RADIOGRAPHIC STUDIES: I have personally  reviewed the radiological images as listed and agreed with the findings in the report. No results found.   LABORATORY DATA:  I have reviewed the data as listed Lab Results  Component Value Date   WBC 5.2 03/13/2019   HGB 16.4 (H) 03/13/2019   HCT 49.1 (H) 03/13/2019   MCV 95.7 03/13/2019   PLT 194 03/13/2019   Recent Labs    02/08/19 1139 03/13/19 1124  NA 138 139  K 4.7 4.4  CL 100 98  CO2 29 30  GLUCOSE 96 80  BUN 19 23  CREATININE 0.99 0.95  CALCIUM 9.4 10.1  GFRNONAA  --  60*  GFRAA  --  >60  PROT 7.4 8.2*  ALBUMIN 4.2 4.4  AST 21 23  ALT 14 18  ALKPHOS 56 72  BILITOT 0.4 0.6   Iron/TIBC/Ferritin/ %Sat No results found for: IRON, TIBC, FERRITIN, IRONPCTSAT      ASSESSMENT & PLAN:  1. Erythrocytosis   2. Thrombocytopenia ([place])    Labs are reviewed and discussed with patient. Polycythemia (erythrocytosis) is an abnormal elevation of hemoglobin (Hgb) and/or hematocrit (Hct) in peripheral blood, and this can be caused by primary etiology, ie bone marrow mutation, or secondary etiology, ie hypoxia, smoking, androgen supplements, etc.  She had acute increase of hemoglobin level to her chronically erythrocytosis level. Recommend repeat CBC and if hemoglobin is truly around 21, patient would need urgent phlebotomy discussed with her. Check erythropoietin, smear carbo monoxide level, rule out primary etiology, JAK2 with reflex to other mutations Thrombocytopenia, acute, platelet count 74,000.  No acute bleeding.  Check vitamin B12 and folate.  Repeat CBC was reviewed.  Hemoglobin 16.4, with a hematocrit of 49.1.  No need for urgent phlebotomy.  Awaiting rest of the work-up results. Platelet counts have normalized to her baseline at 1 94,000.  Patient will follow-up in the clinic in 2 to 3 weeks to discuss blood work. Orders Placed This Encounter  Procedures  . CBC with Differential/Platelet    Standing Status:   Future    Number of Occurrences:   1    Standing  Expiration Date:   03/12/2020  . JAK2 V617F, w Reflex to CALR/E12/MPL    Standing Status:   Future    Number of Occurrences:   1    Standing Expiration Date:   03/12/2020  . Carbon monoxide, blood (performed at ref lab)    Standing Status:   Future    Number of Occurrences:   1    Standing Expiration Date:   03/12/2020  . Erythropoietin    Standing Status:   Future    Number of Occurrences:   1    Standing Expiration Date:   03/12/2020  . Technologist smear review    Standing Status:   Future    Number of Occurrences:   1    Standing Expiration Date:   03/12/2020  . Comprehensive metabolic panel    Standing Status:   Future    Number of Occurrences:   1    Standing Expiration Date:   03/12/2020  . Folate    Standing Status:   Future    Number of Occurrences:   1    Standing Expiration Date:   03/12/2020  . Vitamin B12    Standing Status:   Future    Number of Occurrences:   1    Standing Expiration Date:   03/12/2020    We spent sufficient time to discuss many aspect of care, questions were answered to patient's satisfaction. The patient knows to call the clinic with any problems questions or concerns.  Cc Luetta Nutting, DO  Return of visit: 2 weeks Thank you for this kind referral and the opportunity to participate in the care of this patient. A copy of today's note is routed to referring provider  Total face to face encounter time for this patient visit was 45 min. >50% of the time was  spent in counseling and coordination of care.    Earlie Server, MD, PhD 03/13/2019

## 2019-03-13 NOTE — Progress Notes (Signed)
Patient here for initial evaluation for polycythemia.

## 2019-03-14 LAB — CARBON MONOXIDE, BLOOD (PERFORMED AT REF LAB): Carbon Monoxide, Blood: 2.4 % (ref 0.0–3.6)

## 2019-03-14 LAB — ERYTHROPOIETIN: Erythropoietin: 9.6 m[IU]/mL (ref 2.6–18.5)

## 2019-03-21 ENCOUNTER — Encounter: Payer: Self-pay | Admitting: Oncology

## 2019-03-23 DIAGNOSIS — H40013 Open angle with borderline findings, low risk, bilateral: Secondary | ICD-10-CM | POA: Diagnosis not present

## 2019-03-23 LAB — CALR + JAK2 E12-15 + MPL (REFLEXED)

## 2019-03-23 LAB — JAK2 V617F, W REFLEX TO CALR/E12/MPL

## 2019-04-03 NOTE — Progress Notes (Signed)
Patient is coming in for follow up she is doing well no complaints  

## 2019-04-04 ENCOUNTER — Other Ambulatory Visit: Payer: Self-pay | Admitting: Family Medicine

## 2019-04-04 ENCOUNTER — Inpatient Hospital Stay: Payer: Medicare HMO | Attending: Oncology | Admitting: Oncology

## 2019-04-04 ENCOUNTER — Encounter: Payer: Self-pay | Admitting: Oncology

## 2019-04-04 ENCOUNTER — Other Ambulatory Visit: Payer: Self-pay

## 2019-04-04 VITALS — BP 135/58 | HR 77 | Temp 98.1°F | Resp 16 | Wt 138.9 lb

## 2019-04-04 DIAGNOSIS — Z833 Family history of diabetes mellitus: Secondary | ICD-10-CM | POA: Diagnosis not present

## 2019-04-04 DIAGNOSIS — Z7982 Long term (current) use of aspirin: Secondary | ICD-10-CM | POA: Insufficient documentation

## 2019-04-04 DIAGNOSIS — Z79899 Other long term (current) drug therapy: Secondary | ICD-10-CM | POA: Diagnosis not present

## 2019-04-04 DIAGNOSIS — I10 Essential (primary) hypertension: Secondary | ICD-10-CM | POA: Diagnosis not present

## 2019-04-04 DIAGNOSIS — F329 Major depressive disorder, single episode, unspecified: Secondary | ICD-10-CM | POA: Insufficient documentation

## 2019-04-04 DIAGNOSIS — M8580 Other specified disorders of bone density and structure, unspecified site: Secondary | ICD-10-CM | POA: Diagnosis not present

## 2019-04-04 DIAGNOSIS — D751 Secondary polycythemia: Secondary | ICD-10-CM | POA: Diagnosis not present

## 2019-04-04 DIAGNOSIS — K589 Irritable bowel syndrome without diarrhea: Secondary | ICD-10-CM | POA: Insufficient documentation

## 2019-04-04 NOTE — Progress Notes (Signed)
Hematology/Oncology follow up note [facility] Telephone:(336) [phone] Fax:(336) [phone]   Patient Care Team: Lucille Passy, MD as PCP - General (Family Medicine) Evans Lance, MD as Consulting Physician (Cardiology) Macarthur Critchley, [place] as Referring Physician (Optometry) Orie Rout, MD as Consulting Physician (Neurology) Glennie Isle PA-C as Physician Assistant (Dermatology) Vladimir Crofts, MD as Consulting Physician (Neurology)  REFERRING PROVIDER: Lucille Passy, MD  CHIEF COMPLAINTS/REASON FOR VISIT:  Follow-up for erythrocytosis  HISTORY OF PRESENTING ILLNESS:  Dana Massey is a 73 y.o. female who was seen in consultation at the request of Lucille Passy, MD for evaluation of polycytosis/erythrocytosis Reviewed patient's recent lab work  03/10/2019 labs showed elevated hemoglobin at 21.1 with hematocrit of 63.9 total white count 3.5, platelet counts 74,000. Reviewed patient's previous blood work.  Anisocytosis is chronic Onset, duration since 2010 No aggravating or alleviating factors.  Most recent blood work seems to be in acute increase of her hemoglobin chronic baseline.  Associated signs or symptoms: Denies weight loss, fever, chills, fatigue, night sweats.   Context:  Smoking history: Never smoker. Denies any herbal supplementation. History of blood clots: Denies Daytime somnolence: Denies Family history of polycythemia: Denies  Patient has a history of vein insufficiency.  She has mild headache today.  Has a history of migraine.  No double vision, focal deficits, slurred speech she has noticed some right lower quadrant abdominal discomfort for the past few days.  No nausea vomiting  INTERVAL HISTORY Dana Massey is a 73 y.o. female who has above history reviewed by me today presents for follow up visit for management of erythrocytosis Problems and complaints are listed below: During the interval patient has had work-up done and  she presents to discuss results. No headache today.  Denies any new complaints.  Denies any constitutional symptoms.  Review of Systems  Constitutional: Negative for appetite change, chills, fatigue and fever.  HENT:   Negative for hearing loss and voice change.   Eyes: Negative for eye problems.  Respiratory: Negative for chest tightness and cough.   Cardiovascular: Negative for chest pain.  Gastrointestinal: Negative for abdominal distention, abdominal pain and blood in stool.  Endocrine: Negative for hot flashes.  Genitourinary: Negative for difficulty urinating and frequency.   Musculoskeletal: Negative for arthralgias.  Skin: Negative for itching and rash.  Neurological: Negative for extremity weakness and headaches.  Hematological: Negative for adenopathy.  Psychiatric/Behavioral: Negative for confusion.    MEDICAL HISTORY:  Past Medical History:  Diagnosis Date  . Cataract    left eye surgery to remove, right eye just watching currently  . Constipation   . Depression   . Diverticulosis of colon   . Glaucoma    ocular htn bilateral eyes - uses drops   . History of blood transfusion 1994   with spinal fusion surgery  . IBS (irritable bowel syndrome)   . Migraine headache    resolved, no current problem  . Mitral valve prolapse    patient denies this dx  . Neuromuscular disorder ([place])    essential tremor hands  . Osteopenia   . PAC (premature atrial contraction)    on metoprolol  . Palpitations   . Scoliosis   . Shoulder pain    resolved, no loonger a problem  . Toxic effect of venom(989.5)   . Venous insufficiency     SURGICAL HISTORY: Past Surgical History:  Procedure Laterality Date  . BACK SURGERY  1994   scoliosis at [facility]  spine center  . CATARACT EXTRACTION Left 04/2010  . COLONOSCOPY  2009   [place]- Normal  . ROTATOR CUFF REPAIR  2006  . WISDOM TOOTH EXTRACTION      SOCIAL HISTORY: Social History   Socioeconomic History  . Marital status:  Married    Spouse name: [name]  . Number of children: 2  . Years of education: Not on file  . Highest education level: Not on file  Occupational History  . Not on file  Social Needs  . Financial resource strain: Not on file  . Food insecurity    Worry: Not on file    Inability: Not on file  . Transportation needs    Medical: Not on file    Non-medical: Not on file  Tobacco Use  . Smoking status: Never Smoker  . Smokeless tobacco: Never Used  Substance and Sexual Activity  . Alcohol use: Yes    Comment: rarely  . Drug use: No  . Sexual activity: Yes    Partners: Male    Birth control/protection: Post-menopausal    Comment: husband vasectomy  Lifestyle  . Physical activity    Days per week: Not on file    Minutes per session: Not on file  . Stress: Not on file  Relationships  . Social Herbalist on phone: Not on file    Gets together: Not on file    Attends religious service: Not on file    Active member of club or organization: Not on file    Attends meetings of clubs or organizations: Not on file    Relationship status: Not on file  . Intimate partner violence    Fear of current or ex partner: Not on file    Emotionally abused: Not on file    Physically abused: Not on file    Forced sexual activity: Not on file  Other Topics Concern  . Not on file  Social History Narrative   Does not have a living will.   Desires CPR but would not want prolonged life support.          FAMILY HISTORY: Family History  Problem Relation Age of Onset  . Diabetes Mother   . Colon cancer Neg Hx   . Rectal cancer Neg Hx   . Stomach cancer Neg Hx   . Esophageal cancer Neg Hx     ALLERGIES:  is allergic to wasp venom.  MEDICATIONS:  Current Outpatient Medications  Medication Sig Dispense Refill  . Acetaminophen 500 MG coapsule     . aspirin EC 81 MG tablet Take 81 mg by mouth daily.    . Calcium-Vitamin D-Vitamin K (VIACTIV) 500-100-40 MG-UNT-MCG CHEW Chew 1  capsule by mouth daily.      . cycloSPORINE (RESTASIS) 0.05 % ophthalmic emulsion Place 1 drop into both eyes 2 (two) times daily.      . dorzolamide (TRUSOPT) 2 % ophthalmic solution 1 drop 2 (two) times daily.    . fish oil-omega-3 fatty acids 1000 MG capsule Take 2 g by mouth daily.      [name] latanoprost (XALATAN) 0.005 % ophthalmic solution Place 1 drop into both eyes at bedtime.    [name] MELATONIN PO Take 6 mg by mouth at bedtime as needed.     . metoprolol succinate (TOPROL-XL) 25 MG 24 hr tablet TAKE 1 TABLET DAILY WITH OR IMMEDIATELY FOLLOWING A MEAL. 90 tablet 3  . Multiple Vitamins-Minerals (MULTIVITAMIN WITH MINERALS) tablet Take 1 tablet by mouth  daily.      . polyethylene glycol powder (MIRALAX) powder Take 17 g by mouth daily as needed.     . sertraline (ZOLOFT) 50 MG tablet Take 1.5 tablets (75 mg total) by mouth daily. 135 tablet 3   No current facility-administered medications for this visit.      PHYSICAL EXAMINATION: ECOG PERFORMANCE STATUS: 0 - Asymptomatic Vitals:   04/04/19 1031  BP: (!) 135/58  Pulse: 77  Resp: 16  Temp: 98.1 F (36.7 C)   Filed Weights   04/04/19 1031  Weight: 138 lb 14.4 oz (63 kg)    Physical Exam Constitutional:      General: She is not in acute distress. HENT:     Head: Normocephalic and atraumatic.  Eyes:     General: No scleral icterus.    Pupils: Pupils are equal, round, and reactive to light.  Neck:     Musculoskeletal: Normal range of motion and neck supple.  Cardiovascular:     Rate and Rhythm: Normal rate and regular rhythm.     Heart sounds: Normal heart sounds.  Pulmonary:     Effort: Pulmonary effort is normal. No respiratory distress.     Breath sounds: No wheezing.  Abdominal:     General: Bowel sounds are normal. There is no distension.     Palpations: Abdomen is soft. There is no mass.     Tenderness: There is no abdominal tenderness.  Musculoskeletal: Normal range of motion.        General: No deformity.  Skin:     General: Skin is warm and dry.     Findings: No erythema or rash.  Neurological:     Mental Status: She is alert and oriented to person, place, and time.     Cranial Nerves: No cranial nerve deficit.     Coordination: Coordination normal.  Psychiatric:        Behavior: Behavior normal.        Thought Content: Thought content normal.     RADIOGRAPHIC STUDIES: I have personally reviewed the radiological images as listed and agreed with the findings in the report. No results found.   LABORATORY DATA:  I have reviewed the data as listed Lab Results  Component Value Date   WBC 5.2 03/13/2019   HGB 16.4 (H) 03/13/2019   HCT 49.1 (H) 03/13/2019   MCV 95.7 03/13/2019   PLT 194 03/13/2019   Recent Labs    02/08/19 1139 03/13/19 1124  NA 138 139  K 4.7 4.4  CL 100 98  CO2 29 30  GLUCOSE 96 80  BUN 19 23  CREATININE 0.99 0.95  CALCIUM 9.4 10.1  GFRNONAA  --  60*  GFRAA  --  >60  PROT 7.4 8.2*  ALBUMIN 4.2 4.4  AST 21 23  ALT 14 18  ALKPHOS 56 72  BILITOT 0.4 0.6   Iron/TIBC/Ferritin/ %Sat No results found for: IRON, TIBC, FERRITIN, IRONPCTSAT      ASSESSMENT & PLAN:  1. Erythrocytosis    #Labs reviewed and discussed with patient. JAK2 V617F mutation negative, with reflex to other mutations CALR, MPL, JAK 2 Ex 12-15 mutations negative. This ruled out majority of the etiology of primary erythrocytosis. Patient is a non-smoker. I recommend patient to have sleep study done to rule out sleep apnea as a secondary erythrocytosis etiology. If no sleep apnea, I would recommend to proceed with bone marrow biopsy for further evaluation. I will touch base with patient's primary care provider Dr. Deborra Medina  regarding sleep study.  Sent secure chat message.  Thrombocytopenia, platelet count resolved. Erythrocytosis, hemoglobin 16.4, decreased from the hemoglobin level of 21.1 on 03/10/2019.  I wonder there might be lab error on the sample/processing.  I discussed with patient  that hemoglobin is 16.4, hematocrit 49.1. For secondary erythrocytosis, would proceed phlebotomy if hematocrit persistently above 50. No urgent phlebotomy needed today.  She can follow-up in 2 months with repeat blood work and evaluation of need for phlebotomy.   Orders Placed This Encounter  Procedures  . CBC with Differential/Platelet    Standing Status:   Future    Standing Expiration Date:   04/03/2020    We spent sufficient time to discuss many aspect of care, questions were answered to patient's satisfaction. The patient knows to call the clinic with any problems questions or concerns.  Cc Lucille Passy, MD  Return of visit: 2 months.  Earlie Server, MD, PhD 04/04/2019

## 2019-04-06 ENCOUNTER — Encounter: Payer: Self-pay | Admitting: Family Medicine

## 2019-04-07 ENCOUNTER — Other Ambulatory Visit: Payer: Self-pay | Admitting: Family Medicine

## 2019-04-07 DIAGNOSIS — D751 Secondary polycythemia: Secondary | ICD-10-CM

## 2019-04-20 ENCOUNTER — Encounter: Payer: Self-pay | Admitting: Family Medicine

## 2019-04-26 ENCOUNTER — Ambulatory Visit: Payer: Medicare HMO | Admitting: Pulmonary Disease

## 2019-04-26 ENCOUNTER — Encounter: Payer: Self-pay | Admitting: Pulmonary Disease

## 2019-04-26 ENCOUNTER — Other Ambulatory Visit: Payer: Self-pay

## 2019-04-26 VITALS — BP 128/72 | HR 77 | Temp 97.7°F | Ht 64.0 in | Wt 138.4 lb

## 2019-04-26 DIAGNOSIS — R0683 Snoring: Secondary | ICD-10-CM | POA: Diagnosis not present

## 2019-04-26 DIAGNOSIS — D751 Secondary polycythemia: Secondary | ICD-10-CM | POA: Diagnosis not present

## 2019-04-26 NOTE — Progress Notes (Signed)
Pulmonary, Critical Care, and Sleep Medicine  Chief Complaint  Patient presents with  . sleep consult    Per Dr. Deborra Medina- no prior sleep study- c/o occ daytime sleepiness, restless sleep and loud snoring.     Constitutional:  BP 128/72 (BP Location: Left Arm, Cuff Size: Normal)   Pulse 77   Temp 97.7 F (36.5 C) (Temporal)   Ht 5\' 4"  (1.626 m)   Wt 138 lb 6.4 oz (62.8 kg)   LMP 09/13/2000   SpO2 94%   BMI 23.76 kg/m   Past Medical History:  Tremors, Migraine HA, Depression, Glaucoma, Scoliosis, MVP, IBS, Diverticulosis, Cataract  Brief Summary:  Dana Massey is a 73 y.o. female with polycythemia and snoring.  She was noted to have elevated hemoglobin and seen by hematology.  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  She was advised to have sleep testing to assess for obstructive sleep apnea.   She snores some at night.  Has trouble sleeping on her back.  Has frequent dreams at night.  She wakes up frequently during the night, but then is able to go back to sleep.  Has trouble staying awake when reading.  She goes to sleep at 1130 pm.  She falls asleep in 15 minutes.  She gets out of bed at 8 am.  She feels okay in the morning.  She denies morning headache.  She does not use anything to help her stay awake.  For the past 3 years she has been taking melatonin about 1 hour before she goes to sleep.  This helps some.  She denies sleep walking, sleep talking, bruxism, or nightmares.  There is no history of restless legs.  She denies sleep hallucinations, sleep paralysis, or cataplexy.  The Epworth score is 4 out of 24.  She has chronic swelling in Lt leg.  Has been present for 15 years.   Physical Exam:   Appearance - well kempt   ENMT - clear nasal mucosa, midline nasal  septum, no oral exudates, no LAN, trachea midline, torus palatinus noted, high arched palate, MP 2  Respiratory - normal chest wall, normal respiratory effort, no accessory muscle use, no wheeze/rales  CV - s1s2 regular rate and rhythm, no murmurs, swelling of Lt leg, no edema in Rt leg, radial pulses symmetric  GI - soft, non tender, no masses  Lymph - no adenopathy noted in neck and axillary areas  MSK - normal gait  Ext - no cyanosis, clubbing, or joint inflammation noted  Skin - no rashes, lesions, or ulcers  Neuro - normal strength, oriented x 3  Psych - normal mood and affect  Discussion:  She was found to have polycythemia.  She has snoring, sleep disruption and daytime sleepiness.  She also has history of depression.  It is possible that she could have obstructive sleep apnea with nocturnal hypoxemia contributing to her findings.  Assessment/Plan:   Snoring with excessive daytime sleepiness. - will need to arrange for a home sleep study  Obesity. - discussed how weight can impact sleep and risk for sleep disordered breathing - discussed options to assist with weight loss: combination of diet modification, cardiovascular and strength training exercises  Cardiovascular risk. - had an extensive discussion regarding the adverse health consequences related to untreated sleep disordered breathing - specifically discussed the risks for hypertension, coronary artery disease, cardiac dysrhythmias, cerebrovascular disease, and diabetes - lifestyle modification discussed  Safe driving practices. - discussed how sleep disruption can increase risk of accidents, particularly when  driving - safe driving practices were discussed  Therapies for obstructive sleep apnea. - if the sleep study shows significant sleep apnea, then various therapies for treatment were reviewed: CPAP, oral appliance, and surgical interventions   Patient Instructions  Will arrange for home sleep study Will call to arrange for follow up after sleep study reviewed     Chesley Mires, MD [place] Pager: [phone] 04/26/2019, 9:31 AM  Flow Sheet    Sleep tests:    Review  of Systems:  Reviewed and negative  Medications:   Allergies as of 04/26/2019      Reactions   Wasp Venom Anaphylaxis   Has EpiPen Has EpiPen      Medication List       Accurate as of April 26, 2019  9:31 AM. If you have any questions, ask your nurse or doctor.        Acetaminophen 500 MG coapsule   aspirin EC 81 MG tablet Take 81 mg by mouth daily.   cycloSPORINE 0.05 % ophthalmic emulsion Commonly known as: RESTASIS Place 1 drop into both eyes 2 (two) times daily.   dorzolamide 2 % ophthalmic solution Commonly known as: TRUSOPT 1 drop 2 (two) times daily.   fish oil-omega-3 fatty acids 1000 MG capsule Take 2 g by mouth daily.   latanoprost 0.005 % ophthalmic solution Commonly known as: XALATAN Place 1 drop into both eyes at bedtime.   MELATONIN PO Take 6 mg by mouth at bedtime as needed.   metoprolol succinate 25 MG 24 hr tablet Commonly known as: TOPROL-XL TAKE 1 TABLET DAILY WITH OR IMMEDIATELY FOLLOWING A MEAL.   MiraLax 17 GM/SCOOP powder Generic drug: polyethylene glycol powder Take 17 g by mouth daily as needed.   multivitamin with minerals tablet Take 1 tablet by mouth daily.   sertraline 50 MG tablet Commonly known as: ZOLOFT Take 1.5 tablets (75 mg total) by mouth daily.   Viactiv [id number] MG-UNT-MCG Chew Generic drug: Calcium-Vitamin D-Vitamin K Chew 1 capsule by mouth daily.       Past Surgical History:  She  has a past surgical history that includes Back surgery (1994); Rotator cuff repair (2006); Cataract extraction (Left, 04/2010); Wisdom tooth extraction; and Colonoscopy (2009).  Family History:  Her family history includes Diabetes in her mother.  Social History:  She  reports that she has never smoked. She has never used smokeless tobacco. She reports current alcohol use. She reports that she does not use drugs.

## 2019-04-26 NOTE — Patient Instructions (Signed)
Will arrange for home sleep study Will call to arrange for follow up after sleep study reviewed  

## 2019-05-04 NOTE — Telephone Encounter (Signed)
PCC - please advise. Thanks! 

## 2019-05-04 NOTE — Telephone Encounter (Signed)
Please let the pt know thru MyChart that I verified we do have pt's sleep study order and it has been precerted.  Order was placed on 11/11 and it does usually take a couple of weeks for a pt to be called.  We call pt's in the order that they come in.  There are several pt's ahead of her to be called but we will get to her as soon as we can.

## 2019-05-22 ENCOUNTER — Encounter: Payer: Self-pay | Admitting: Oncology

## 2019-05-24 NOTE — Telephone Encounter (Signed)
The sleep studies were put on hold for a bit due to the increase in covid cases per [facility].  We just are trying to get them scheduled again.  We are scheduling them in the order they were put in the system.  Please let pt know thru MyChart we will get her scheduled as soon as we can but will take a bit longer.

## 2019-05-24 NOTE — Telephone Encounter (Signed)
Mychart message sent by pt which I have posted below:   To: [facility]OL    From: Dana Massey    Created: 05/24/2019 4:52 PM     *-*-*This message was handled on 05/24/2019 4:54 PM by Holbert Caples P*-*-*  Dr. Soon,  It's been 4 weeks since I saw you and a home sleep study was ordered. However I have not yet been scheduled for it. I have had to cancel my 12/21 visit with Dr. Tasia Catchings because she wants to see sleep study results before she can proceed with further treatment.   Obviously I am anxious to get this done and was originally told it would take about two weeks from the time the test was ordered. Can you give me an update?  Thanks,  Dana Massey      PCCs, please advise on this for pt. Thanks!

## 2019-06-05 ENCOUNTER — Ambulatory Visit: Payer: Medicare HMO | Admitting: Oncology

## 2019-06-05 ENCOUNTER — Other Ambulatory Visit: Payer: Medicare HMO

## 2019-06-19 ENCOUNTER — Other Ambulatory Visit: Payer: Self-pay

## 2019-06-19 ENCOUNTER — Ambulatory Visit: Payer: Medicare HMO

## 2019-06-19 DIAGNOSIS — R0683 Snoring: Secondary | ICD-10-CM

## 2019-06-20 ENCOUNTER — Telehealth: Payer: Self-pay | Admitting: General Surgery

## 2019-06-20 NOTE — Telephone Encounter (Signed)
-----   Message from Chesley Mires, MD sent at 06/20/2019  3:41 PM EST ----- This patient had home sleep study from 06/19/19.  The leads must have come off half way through the study.  As a result, the study will need to be repeated.  Thanks.  V

## 2019-06-20 NOTE — Telephone Encounter (Signed)
Unable to reach patient to let her know the test needs to be repeated. During the first call the line rang multiple times, but once it was picked up several click sounds could be heard and the call was lost. When I called back there was a rapid busy signal. Will try again.

## 2019-06-26 ENCOUNTER — Other Ambulatory Visit: Payer: Self-pay

## 2019-06-26 DIAGNOSIS — G4733 Obstructive sleep apnea (adult) (pediatric): Secondary | ICD-10-CM

## 2019-06-26 NOTE — Telephone Encounter (Signed)
Spoke with patient she confirmed she came to clinic this afternoon and picked up home sleep test and will repeat it. Nothing further needed at this time.

## 2019-06-27 ENCOUNTER — Encounter: Payer: Self-pay | Admitting: Oncology

## 2019-06-27 ENCOUNTER — Telehealth: Payer: Self-pay | Admitting: Pulmonary Disease

## 2019-06-27 DIAGNOSIS — G4733 Obstructive sleep apnea (adult) (pediatric): Secondary | ICD-10-CM | POA: Diagnosis not present

## 2019-06-27 NOTE — Telephone Encounter (Signed)
HST 06/26/19 >> AHI 6.8, SpO2 low 80%   Please inform her that her sleep study shows mild obstructive sleep apnea.  Please arrange for ROV with me or NP to discuss treatment options.

## 2019-06-27 NOTE — Telephone Encounter (Signed)
Called the patient and made her aware of the results. Voiced understanding. Appointment for follow up set up for 06/29/19 at 11:30 at the [facility] office (based on patient request). Nothing further needed at this time.

## 2019-06-29 ENCOUNTER — Ambulatory Visit: Payer: Medicare HMO | Admitting: Pulmonary Disease

## 2019-06-29 ENCOUNTER — Other Ambulatory Visit: Payer: Self-pay

## 2019-06-29 ENCOUNTER — Encounter: Payer: Self-pay | Admitting: Pulmonary Disease

## 2019-06-29 VITALS — BP 124/70 | HR 78 | Temp 97.7°F | Ht 64.0 in | Wt 137.4 lb

## 2019-06-29 DIAGNOSIS — Z7189 Other specified counseling: Secondary | ICD-10-CM | POA: Diagnosis not present

## 2019-06-29 DIAGNOSIS — D751 Secondary polycythemia: Secondary | ICD-10-CM

## 2019-06-29 DIAGNOSIS — G4733 Obstructive sleep apnea (adult) (pediatric): Secondary | ICD-10-CM | POA: Diagnosis not present

## 2019-06-29 NOTE — Patient Instructions (Signed)
Will have [place] arrange for you to start on auto CPAP with pressure range of 5 - 15 cm water  Follow up in 2 months

## 2019-06-29 NOTE — Progress Notes (Signed)
[name] Pulmonary, Critical Care, and Sleep Medicine  Chief Complaint  Patient presents with  . Follow-up    discuss sleep study-- no new concerns or sx today.     Constitutional:  BP 124/70 (BP Location: Left Arm, Cuff Size: Normal)   Pulse 78   Temp 97.7 F (36.5 C) (Temporal)   Ht 5\' 4"  (1.626 m)   Wt 137 lb 6.4 oz (62.3 kg)   LMP 09/13/2000   SpO2 94%   BMI 23.58 kg/m   Past Medical History:  Tremors, Migraine HA, Depression, Glaucoma, Scoliosis, MVP, IBS, Diverticulosis, Cataract  Brief Summary:  Dana Massey is a 74 y.o. female with polycythemia and snoring.  She had home sleep study earlier this month.  Has mild sleep apnea with oxygen desaturation.  She feels her breathing is shallow at times.  Her husband has sleep apnea and has a CPAP machine that is only a few years old.  He is no longer using this.  He got this through [place].  Respiratory Exam:   Appearance - well kempt   ENMT - no sinus tenderness, no nasal discharge, no oral exudate, torus palatinus noted, high arched palate, MP 2  Neck - no masses, trachea midline, no thyromegaly, no elevation in JVP  Respiratory - normal appearance of chest wall, normal respiratory effort w/o accessory muscle use, no dullness on percussion, no wheezing or rales  CV - s1s2 regular rate and rhythm, no murmurs, no peripheral edema, radial pulses symmetric  Ext - no cyanosis, clubbing, or joint inflammation noted   Assessment/Plan:   Obstructive sleep apnea. - home sleep study reviewed with her - treatment options discussed: CPAP, oral appliance, surgical intervention - she would like to try auto CPAP first - will set her up with [employer] for her DME and see if she can use her husbands CPAP machine once the settings are adjusted for her needs  Polycythemia. - she will follow up with Dr. Tasia Catchings with hematology - explained how nocturnal oxygen desaturation from obstructive sleep apnea can contribute to this - will  arrange for overnight oximetry with CPAP   Patient Instructions  Will have [place] arrange for you to start on auto CPAP with pressure range of 5 - 15 cm water  Follow up in 2 months  A total of  23 minutes were spent face to face and non-face to face with the patient and more than half of that time involved counseling or coordination of care.   Chesley Mires, MD [place] Pulmonary/Critical Care Pager: [phone] 06/29/2019, 11:47 AM  Flow Sheet    Sleep tests:  HST 06/26/19 >> AHI 6.8, SpO2 low 80%  Medications:   Allergies as of 06/29/2019      Reactions   Wasp Venom Anaphylaxis   Has EpiPen Has EpiPen      Medication List       Accurate as of June 29, 2019 11:47 AM. If you have any questions, ask your nurse or doctor.        Acetaminophen 500 MG coapsule   aspirin EC 81 MG tablet Take 81 mg by mouth daily.   cycloSPORINE 0.05 % ophthalmic emulsion Commonly known as: RESTASIS Place 1 drop into both eyes 2 (two) times daily.   dorzolamide 2 % ophthalmic solution Commonly known as: TRUSOPT 1 drop 2 (two) times daily.   fish oil-omega-3 fatty acids 1000 MG capsule Take 2 g by mouth daily.   latanoprost 0.005 % ophthalmic solution Commonly known as: Tax inspector  1 drop into both eyes at bedtime.   MELATONIN PO Take 6 mg by mouth at bedtime as needed.   metoprolol succinate 25 MG 24 hr tablet Commonly known as: TOPROL-XL TAKE 1 TABLET DAILY WITH OR IMMEDIATELY FOLLOWING A MEAL.   MiraLax 17 GM/SCOOP powder Generic drug: polyethylene glycol powder Take 17 g by mouth daily as needed.   multivitamin with minerals tablet Take 1 tablet by mouth daily.   sertraline 50 MG tablet Commonly known as: ZOLOFT Take 1.5 tablets (75 mg total) by mouth daily.   Viactiv [id number] MG-UNT-MCG Chew Generic drug: Calcium-Vitamin D-Vitamin K Chew 1 capsule by mouth daily.       Past Surgical History:  She  has a past surgical history that includes Back  surgery (1994); Rotator cuff repair (2006); Cataract extraction (Left, 04/2010); Wisdom tooth extraction; and Colonoscopy (2009).  Family History:  Her family history includes Diabetes in her mother.  Social History:  She  reports that she has never smoked. She has never used smokeless tobacco. She reports current alcohol use. She reports that she does not use drugs.

## 2019-07-04 ENCOUNTER — Encounter: Payer: Self-pay | Admitting: Pulmonary Disease

## 2019-07-05 ENCOUNTER — Inpatient Hospital Stay: Payer: Medicare HMO

## 2019-07-05 ENCOUNTER — Encounter: Payer: Self-pay | Admitting: Oncology

## 2019-07-05 ENCOUNTER — Other Ambulatory Visit: Payer: Self-pay

## 2019-07-05 ENCOUNTER — Inpatient Hospital Stay (HOSPITAL_BASED_OUTPATIENT_CLINIC_OR_DEPARTMENT_OTHER): Payer: Medicare HMO | Admitting: Oncology

## 2019-07-05 ENCOUNTER — Inpatient Hospital Stay: Payer: Medicare HMO | Attending: Oncology

## 2019-07-05 VITALS — BP 133/82 | HR 73 | Temp 97.2°F | Resp 18 | Wt 140.0 lb

## 2019-07-05 DIAGNOSIS — M8580 Other specified disorders of bone density and structure, unspecified site: Secondary | ICD-10-CM | POA: Diagnosis not present

## 2019-07-05 DIAGNOSIS — Z833 Family history of diabetes mellitus: Secondary | ICD-10-CM | POA: Diagnosis not present

## 2019-07-05 DIAGNOSIS — I10 Essential (primary) hypertension: Secondary | ICD-10-CM | POA: Insufficient documentation

## 2019-07-05 DIAGNOSIS — R7989 Other specified abnormal findings of blood chemistry: Secondary | ICD-10-CM

## 2019-07-05 DIAGNOSIS — Z7982 Long term (current) use of aspirin: Secondary | ICD-10-CM | POA: Insufficient documentation

## 2019-07-05 DIAGNOSIS — Z79899 Other long term (current) drug therapy: Secondary | ICD-10-CM | POA: Diagnosis not present

## 2019-07-05 DIAGNOSIS — F329 Major depressive disorder, single episode, unspecified: Secondary | ICD-10-CM | POA: Diagnosis not present

## 2019-07-05 DIAGNOSIS — D751 Secondary polycythemia: Secondary | ICD-10-CM | POA: Insufficient documentation

## 2019-07-05 DIAGNOSIS — G4733 Obstructive sleep apnea (adult) (pediatric): Secondary | ICD-10-CM | POA: Insufficient documentation

## 2019-07-05 LAB — CBC WITH DIFFERENTIAL/PLATELET
Abs Immature Granulocytes: 0.01 10*3/uL (ref 0.00–0.07)
Basophils Absolute: 0.1 10*3/uL (ref 0.0–0.1)
Basophils Relative: 1 %
Eosinophils Absolute: 0.2 10*3/uL (ref 0.0–0.5)
Eosinophils Relative: 3 %
HCT: 46.8 % — ABNORMAL HIGH (ref 36.0–46.0)
Hemoglobin: 15.1 g/dL — ABNORMAL HIGH (ref 12.0–15.0)
Immature Granulocytes: 0 %
Lymphocytes Relative: 20 %
Lymphs Abs: 1.3 10*3/uL (ref 0.7–4.0)
MCH: 31.8 pg (ref 26.0–34.0)
MCHC: 32.3 g/dL (ref 30.0–36.0)
MCV: 98.5 fL (ref 80.0–100.0)
Monocytes Absolute: 0.5 10*3/uL (ref 0.1–1.0)
Monocytes Relative: 7 %
Neutro Abs: 4.5 10*3/uL (ref 1.7–7.7)
Neutrophils Relative %: 69 %
Platelets: 199 10*3/uL (ref 150–400)
RBC: 4.75 MIL/uL (ref 3.87–5.11)
RDW: 12.7 % (ref 11.5–15.5)
WBC: 6.5 10*3/uL (ref 4.0–10.5)
nRBC: 0 % (ref 0.0–0.2)

## 2019-07-05 LAB — BASIC METABOLIC PANEL
Anion gap: 7 (ref 5–15)
BUN: 21 mg/dL (ref 8–23)
CO2: 31 mmol/L (ref 22–32)
Calcium: 9.2 mg/dL (ref 8.9–10.3)
Chloride: 99 mmol/L (ref 98–111)
Creatinine, Ser: 1.16 mg/dL — ABNORMAL HIGH (ref 0.44–1.00)
GFR calc Af Amer: 54 mL/min — ABNORMAL LOW (ref >60)
GFR calc non Af Amer: 47 mL/min — ABNORMAL LOW (ref >60)
Glucose, Bld: 92 mg/dL (ref 70–99)
Potassium: 4.9 mmol/L (ref 3.5–5.1)
Sodium: 137 mmol/L (ref 135–145)

## 2019-07-05 NOTE — Progress Notes (Signed)
Hematology/Oncology follow up note [facility] [phone] [phone]   Patient Care Team: Lucille Passy, MD as PCP - General (Family Medicine) Evans Lance, MD as Consulting Physician (Cardiology) Macarthur Critchley, [place] as Referring Physician (Optometry) Orie Rout, MD as Consulting Physician (Neurology) Glennie Isle PA-C as Physician Assistant (Dermatology) Vladimir Crofts, MD as Consulting Physician (Neurology)  REFERRING PROVIDER: Lucille Passy, MD  CHIEF COMPLAINTS/REASON FOR VISIT:  Follow-up for erythrocytosis  HISTORY OF PRESENTING ILLNESS:  Dana Massey is a 74 y.o. female who was seen in consultation at the request of Lucille Passy, MD for evaluation of polycytosis/erythrocytosis Reviewed patient's recent lab work  03/10/2019 labs showed elevated hemoglobin at 21.1 with hematocrit of 63.9 total white count 3.5, platelet counts 74,000. Reviewed patient's previous blood work.  Anisocytosis is chronic Onset, duration since 2010 No aggravating or alleviating factors.  Most recent blood work seems to be in acute increase of her hemoglobin chronic baseline.  Associated signs or symptoms: Denies weight loss, fever, chills, fatigue, night sweats.   Context:  Smoking history: Never smoker. Denies any herbal supplementation. History of blood clots: Denies Daytime somnolence: Denies Family history of polycythemia: Denies  Patient has a history of vein insufficiency.  She has mild headache today.  Has a history of migraine.  No double vision, focal deficits, slurred speech she has noticed some right lower quadrant abdominal discomfort for the past few days.  No nausea vomiting  #JAK2 V617F mutation negative, with reflex to other mutations CALR, MPL, JAK 2 Ex 12-15 mutations negative. #Sleep study showed obstructive sleep apnea.  CPAP was started 06/30/2019.  INTERVAL HISTORY Dana Massey is a 74 y.o. female who has above  history reviewed by me today presents for follow up visit for management of erythrocytosis Problems and complaints are listed below: During the interval, patient has had a sleep study done and was diagnosed with sleep apnea.  Patient was recommended to start CPAP at night and she has started last Friday. Today she denies any new Symptoms.  Review of Systems  Constitutional: Negative for appetite change, chills, fatigue and fever.  HENT:   Negative for hearing loss and voice change.   Eyes: Negative for eye problems.  Respiratory: Negative for chest tightness and cough.   Cardiovascular: Negative for chest pain.  Gastrointestinal: Negative for abdominal distention, abdominal pain and blood in stool.  Endocrine: Negative for hot flashes.  Genitourinary: Negative for difficulty urinating and frequency.   Musculoskeletal: Negative for arthralgias.  Skin: Negative for itching and rash.  Neurological: Negative for extremity weakness and headaches.  Hematological: Negative for adenopathy.  Psychiatric/Behavioral: Negative for confusion.    MEDICAL HISTORY:  Past Medical History:  Diagnosis Date  . Cataract    left eye surgery to remove, right eye just watching currently  . Constipation   . Depression   . Diverticulosis of colon   . Glaucoma    ocular htn bilateral eyes - uses drops   . History of blood transfusion 1994   with spinal fusion surgery  . IBS (irritable bowel syndrome)   . Migraine headache    resolved, no current problem  . Mitral valve prolapse    patient denies this dx  . Neuromuscular disorder ([place])    essential tremor hands  . Osteopenia   . PAC (premature atrial contraction)    on metoprolol  . Palpitations   . Scoliosis   . Shoulder pain    resolved,  no loonger a problem  . Toxic effect of venom(989.5)   . Venous insufficiency     SURGICAL HISTORY: Past Surgical History:  Procedure Laterality Date  . BACK SURGERY  1994   scoliosis at [facility]    . CATARACT EXTRACTION Left 04/2010  . COLONOSCOPY  2009   [place]- Normal  . ROTATOR CUFF REPAIR  2006  . WISDOM TOOTH EXTRACTION      SOCIAL HISTORY: Social History   Socioeconomic History  . Marital status: Married    Spouse name: [name]  . Number of children: 2  . Years of education: Not on file  . Highest education level: Not on file  Occupational History  . Not on file  Tobacco Use  . Smoking status: Never Smoker  . Smokeless tobacco: Never Used  Substance and Sexual Activity  . Alcohol use: Yes    Comment: rarely  . Drug use: No  . Sexual activity: Yes    Partners: Male    Birth control/protection: Post-menopausal    Comment: husband vasectomy  Other Topics Concern  . Not on file  Social History Narrative   Does not have a living will.   Desires CPR but would not want prolonged life support.         Social Determinants of Health   Financial Resource Strain:   . Difficulty of Paying Living Expenses: Not on file  Food Insecurity:   . Worried About Charity fundraiser in the Last Year: Not on file  . Ran Out of Food in the Last Year: Not on file  Transportation Needs:   . Lack of Transportation (Medical): Not on file  . Lack of Transportation (Non-Medical): Not on file  Physical Activity:   . Days of Exercise per Week: Not on file  . Minutes of Exercise per Session: Not on file  Stress:   . Feeling of Stress : Not on file  Social Connections:   . Frequency of Communication with Friends and Family: Not on file  . Frequency of Social Gatherings with Friends and Family: Not on file  . Attends Religious Services: Not on file  . Active Member of Clubs or Organizations: Not on file  . Attends Archivist Meetings: Not on file  . Marital Status: Not on file  Intimate Partner Violence:   . Fear of Current or Ex-Partner: Not on file  . Emotionally Abused: Not on file  . Physically Abused: Not on file  . Sexually Abused: Not on file    FAMILY  HISTORY: Family History  Problem Relation Age of Onset  . Diabetes Mother   . Colon cancer Neg Hx   . Rectal cancer Neg Hx   . Stomach cancer Neg Hx   . Esophageal cancer Neg Hx     ALLERGIES:  is allergic to wasp venom.  MEDICATIONS:  Current Outpatient Medications  Medication Sig Dispense Refill  . Acetaminophen 500 MG coapsule     . aspirin EC 81 MG tablet Take 81 mg by mouth daily.    . Calcium-Vitamin D-Vitamin K (VIACTIV) 500-100-40 MG-UNT-MCG CHEW Chew 1 capsule by mouth daily.      . cycloSPORINE (RESTASIS) 0.05 % ophthalmic emulsion Place 1 drop into both eyes 2 (two) times daily.      . dorzolamide (TRUSOPT) 2 % ophthalmic solution 1 drop 2 (two) times daily.    . fish oil-omega-3 fatty acids 1000 MG capsule Take 2 g by mouth daily.      [name]  latanoprost (XALATAN) 0.005 % ophthalmic solution Place 1 drop into both eyes at bedtime.    [name] MELATONIN PO Take 6 mg by mouth at bedtime as needed.     . metoprolol succinate (TOPROL-XL) 25 MG 24 hr tablet TAKE 1 TABLET DAILY WITH OR IMMEDIATELY FOLLOWING A MEAL. 90 tablet 3  . Multiple Vitamins-Minerals (MULTIVITAMIN WITH MINERALS) tablet Take 1 tablet by mouth daily.      . polyethylene glycol powder (MIRALAX) powder Take 17 g by mouth daily as needed.     . sertraline (ZOLOFT) 50 MG tablet Take 1.5 tablets (75 mg total) by mouth daily. 135 tablet 3   No current facility-administered medications for this visit.     PHYSICAL EXAMINATION: ECOG PERFORMANCE STATUS: 0 - Asymptomatic There were no vitals filed for this visit. There were no vitals filed for this visit.  Physical Exam Constitutional:      General: She is not in acute distress. HENT:     Head: Normocephalic and atraumatic.  Eyes:     General: No scleral icterus.    Pupils: Pupils are equal, round, and reactive to light.  Cardiovascular:     Rate and Rhythm: Normal rate and regular rhythm.     Heart sounds: Normal heart sounds.  Pulmonary:     Effort: Pulmonary  effort is normal. No respiratory distress.     Breath sounds: No wheezing.  Abdominal:     General: Bowel sounds are normal. There is no distension.     Palpations: Abdomen is soft. There is no mass.     Tenderness: There is no abdominal tenderness.  Musculoskeletal:        General: No deformity. Normal range of motion.     Cervical back: Normal range of motion and neck supple.  Skin:    General: Skin is warm and dry.     Findings: No erythema or rash.  Neurological:     Mental Status: She is alert and oriented to person, place, and time.     Cranial Nerves: No cranial nerve deficit.     Coordination: Coordination normal.  Psychiatric:        Behavior: Behavior normal.        Thought Content: Thought content normal.     RADIOGRAPHIC STUDIES: I have personally reviewed the radiological images as listed and agreed with the findings in the report. No results found.   LABORATORY DATA:  I have reviewed the data as listed Lab Results  Component Value Date   WBC 6.5 07/05/2019   HGB 15.1 (H) 07/05/2019   HCT 46.8 (H) 07/05/2019   MCV 98.5 07/05/2019   PLT 199 07/05/2019   Recent Labs    02/08/19 1139 03/13/19 1124 07/05/19 1351  NA 138 139 137  K 4.7 4.4 4.9  CL 100 98 99  CO2 29 30 31   GLUCOSE 96 80 92  BUN 19 23 21   CREATININE 0.99 0.95 1.16*  CALCIUM 9.4 10.1 9.2  GFRNONAA  --  60* 47*  GFRAA  --  >60 54*  PROT 7.4 8.2*  --   ALBUMIN 4.2 4.4  --   AST 21 23  --   ALT 14 18  --   ALKPHOS 56 72  --   BILITOT 0.4 0.6  --    Iron/TIBC/Ferritin/ %Sat No results found for: IRON, TIBC, FERRITIN, IRONPCTSAT      ASSESSMENT & PLAN:  1. Secondary erythrocytosis   2. Obstructive sleep apnea   3. Elevated serum  creatinine    #Labs reviewed and discussed with patient. Since the start of CPAP, patient's hemoglobin has already started to improve. Hemoglobin is 15.1 today. No need for phlebotomy.  Treat underlying conditions.  #Obstructive sleep apnea, recommend  patient to continue follow-up with pulmonology continue use CPAP. #Elevated creatinine, patient endorses not drinking enough fluid these days.  Advised patient to increase oral hydration.  I recommend patient to repeat blood work with primary care doctor in the next 1 to 2 weeks to ensure recovery of creatinine.  Patient voices understanding.  She will contact PCP.  Recommend patient to follow-up in 6 months.  If erythrocytosis resolves, she will be discharged to follow-up with primary care provider Orders Placed This Encounter  Procedures  . CBC with Differential/Platelet    Standing Status:   Future    Standing Expiration Date:   07/04/2020    We spent sufficient time to discuss many aspect of care, questions were answered to patient's satisfaction. The patient knows to call the clinic with any problems questions or concerns.  Cc Lucille Passy, MD  Return of visit: 2 months.  Earlie Server, MD, PhD 07/05/2019

## 2019-07-05 NOTE — Progress Notes (Signed)
Patient had a sleep study and now has a Cpap machine.

## 2019-07-06 DIAGNOSIS — G4733 Obstructive sleep apnea (adult) (pediatric): Secondary | ICD-10-CM | POA: Diagnosis not present

## 2019-07-19 ENCOUNTER — Telehealth: Payer: Self-pay | Admitting: Pulmonary Disease

## 2019-07-19 NOTE — Telephone Encounter (Signed)
ONO with CPAP 07/04/19 >> test time 7 hrs 35 min.  Basal SpO2 95.3%, low SpO2 88%.  Spent 0.6 min with SpO2 < 89%.   Please let her know overnight oxygen test looked good.  She is doing well with CPAP and does not need supplemental oxygen at night.

## 2019-07-20 NOTE — Telephone Encounter (Signed)
Called and made patient aware. She verbalized her understanding. Nothing further needed.

## 2019-07-27 DIAGNOSIS — H40013 Open angle with borderline findings, low risk, bilateral: Secondary | ICD-10-CM | POA: Diagnosis not present

## 2019-07-28 ENCOUNTER — Other Ambulatory Visit: Payer: Self-pay

## 2019-07-28 ENCOUNTER — Ambulatory Visit: Payer: Medicare HMO | Attending: Internal Medicine

## 2019-07-28 ENCOUNTER — Other Ambulatory Visit: Payer: Self-pay | Admitting: Internal Medicine

## 2019-07-28 DIAGNOSIS — Z23 Encounter for immunization: Secondary | ICD-10-CM | POA: Insufficient documentation

## 2019-07-28 NOTE — Progress Notes (Signed)
   Covid-19 Vaccination Clinic  Name:  Dana Massey    MRN: [id number] DOB: [birth year]  07/28/2019  Dana Massey was observed post Covid-19 immunization for 15 minutes without incidence. She was provided with Vaccine Information Sheet and instruction to access the V-Safe system.   Dana Massey was instructed to call 911 with any severe reactions post vaccine: [name] Difficulty breathing  . Swelling of your face and throat  . A fast heartbeat  . A bad rash all over your body  . Dizziness and weakness    Immunizations Administered    Name Date Dose VIS Date Route   Pfizer COVID-19 Vaccine 07/28/2019 12:34 PM 0.3 mL 05/26/2019 Intramuscular   Manufacturer: [place]   Lot: [id number]   [place]: [id number]

## 2019-08-16 ENCOUNTER — Encounter: Payer: Medicare HMO | Admitting: Family Medicine

## 2019-08-17 ENCOUNTER — Encounter: Payer: Self-pay | Admitting: Internal Medicine

## 2019-08-17 ENCOUNTER — Ambulatory Visit: Payer: Medicare HMO | Admitting: Internal Medicine

## 2019-08-17 ENCOUNTER — Other Ambulatory Visit: Payer: Self-pay

## 2019-08-17 VITALS — BP 132/84 | HR 59 | Ht 64.0 in | Wt 136.8 lb

## 2019-08-17 DIAGNOSIS — R002 Palpitations: Secondary | ICD-10-CM | POA: Diagnosis not present

## 2019-08-17 NOTE — Patient Instructions (Addendum)
Medication Instructions:  Your physician recommends that you continue on your current medications as directed. Please refer to the Current Medication list given to you today.  Labwork: None ordered.  Testing/Procedures: None ordered.  Follow-Up: Your physician wants you to follow-up in: two years with Dr. Lovena Le.   You will receive a reminder letter in the mail two months in advance. If you don't receive a letter, please call our office to schedule the follow-up appointment.  Any Other Special Instructions Will Be Listed Below (If Applicable).  If you need a refill on your cardiac medications before your next appointment, please call your pharmacy.

## 2019-08-17 NOTE — Progress Notes (Signed)
HPI Dana Massey returns today for ongoing evaluation of palpitations due to atrial arrhythmias. She has been prescribed metoprolol and been taking it daily. In the interim she notes no symptomatic palpitations. She denies chest pain or sob. No syncope. No edema.  Allergies  Allergen Reactions  . Wasp Venom Anaphylaxis    Has EpiPen Has EpiPen     Current Outpatient Medications  Medication Sig Dispense Refill  . Acetaminophen 500 MG coapsule     . aspirin EC 81 MG tablet Take 81 mg by mouth daily.    . Calcium-Vitamin D-Vitamin K (VIACTIV) 500-100-40 MG-UNT-MCG CHEW Chew 1 capsule by mouth daily.      . cycloSPORINE (RESTASIS) 0.05 % ophthalmic emulsion Place 1 drop into both eyes 2 (two) times daily.      . dorzolamide (TRUSOPT) 2 % ophthalmic solution 1 drop 2 (two) times daily.    . fish oil-omega-3 fatty acids 1000 MG capsule Take 2 g by mouth daily.      [name] latanoprost (XALATAN) 0.005 % ophthalmic solution Place 1 drop into both eyes at bedtime.    [name] MELATONIN PO Take 6 mg by mouth at bedtime as needed.     . metoprolol succinate (TOPROL-XL) 25 MG 24 hr tablet TAKE 1 TABLET DAILY WITH OR IMMEDIATELY FOLLOWING A MEAL. Please keep upcoming appt in March with Dr. Lovena Le for future refills. Thank you 90 tablet 0  . Multiple Vitamins-Minerals (MULTIVITAMIN WITH MINERALS) tablet Take 1 tablet by mouth daily.      . polyethylene glycol powder (MIRALAX) powder Take 17 g by mouth daily as needed.     . sertraline (ZOLOFT) 50 MG tablet Take 1.5 tablets (75 mg total) by mouth daily. 135 tablet 3   No current facility-administered medications for this visit.     Past Medical History:  Diagnosis Date  . Cataract    left eye surgery to remove, right eye just watching currently  . Constipation   . Depression   . Diverticulosis of colon   . Glaucoma    ocular htn bilateral eyes - uses drops   . History of blood transfusion 1994   with spinal fusion surgery  . IBS (irritable  bowel syndrome)   . Migraine headache    resolved, no current problem  . Mitral valve prolapse    patient denies this dx  . Neuromuscular disorder ([place])    essential tremor hands  . Osteopenia   . PAC (premature atrial contraction)    on metoprolol  . Palpitations   . Scoliosis   . Shoulder pain    resolved, no loonger a problem  . Toxic effect of venom(989.5)   . Venous insufficiency     ROS:   All systems reviewed and negative except as noted in the HPI.   Past Surgical History:  Procedure Laterality Date  . BACK SURGERY  1994   scoliosis at [facility]  . CATARACT EXTRACTION Left 04/2010  . COLONOSCOPY  2009   [place]- Normal  . ROTATOR CUFF REPAIR  2006  . WISDOM TOOTH EXTRACTION       Family History  Problem Relation Age of Onset  . Diabetes Mother   . Colon cancer Neg Hx   . Rectal cancer Neg Hx   . Stomach cancer Neg Hx   . Esophageal cancer Neg Hx      Social History   Socioeconomic History  . Marital status: Married    Spouse name: [name]  .  Number of children: 2  . Years of education: Not on file  . Highest education level: Not on file  Occupational History  . Not on file  Tobacco Use  . Smoking status: Never Smoker  . Smokeless tobacco: Never Used  Substance and Sexual Activity  . Alcohol use: Yes    Comment: rarely  . Drug use: No  . Sexual activity: Yes    Partners: Male    Birth control/protection: Post-menopausal    Comment: husband vasectomy  Other Topics Concern  . Not on file  Social History Narrative   Does not have a living will.   Desires CPR but would not want prolonged life support.         Social Determinants of Health   Financial Resource Strain:   . Difficulty of Paying Living Expenses: Not on file  Food Insecurity:   . Worried About Charity fundraiser in the Last Year: Not on file  . Ran Out of Food in the Last Year: Not on file  Transportation Needs:   . Lack of Transportation (Medical): Not on file  .  Lack of Transportation (Non-Medical): Not on file  Physical Activity:   . Days of Exercise per Week: Not on file  . Minutes of Exercise per Session: Not on file  Stress:   . Feeling of Stress : Not on file  Social Connections:   . Frequency of Communication with Friends and Family: Not on file  . Frequency of Social Gatherings with Friends and Family: Not on file  . Attends Religious Services: Not on file  . Active Member of Clubs or Organizations: Not on file  . Attends Archivist Meetings: Not on file  . Marital Status: Not on file  Intimate Partner Violence:   . Fear of Current or Ex-Partner: Not on file  . Emotionally Abused: Not on file  . Physically Abused: Not on file  . Sexually Abused: Not on file     BP 132/84   Pulse (!) 59   Ht 5\' 4"  (1.626 m)   Wt 136 lb 12.8 oz (62.1 kg)   LMP 09/13/2000   SpO2 97%   BMI 23.48 kg/m   Physical Exam:  Well appearing NAD HEENT: Unremarkable Neck:  6 cm JVD, no thyromegally Lymphatics:  No adenopathy Back:  No CVA tenderness Lungs:  Clear with no wheezes HEART:  Regular rate rhythm, no murmurs, no rubs, no clicks Abd:  soft, positive bowel sounds, no organomegally, no rebound, no guarding Ext:  2 plus pulses, no edema, no cyanosis, no clubbing Skin:  No rashes no nodules Neuro:  CN II through XII intact, motor grossly intact  EKG - nsr  Assess/Plan: 1. Palpitations - her atrial arrhythmias have essentially resolved on toprol. She will continue. She does not use caffeine or ETOH and she notes that she gets plenty of sleep. 2. Sleep apnea - she was found to have an increased HCT and evaluation demonstrated sleep apnea. She is on CPAP and feels well.  Mikle Bosworth.D.

## 2019-08-23 ENCOUNTER — Ambulatory Visit: Payer: Medicare HMO | Attending: Internal Medicine

## 2019-08-23 DIAGNOSIS — Z23 Encounter for immunization: Secondary | ICD-10-CM | POA: Insufficient documentation

## 2019-08-23 NOTE — Progress Notes (Signed)
   Covid-19 Vaccination Clinic  Name:  Dana Massey    MRN: [id number] DOB: [birth year]  08/23/2019  Dana Massey was observed post Covid-19 immunization for 15 minutes without incident. She was provided with Vaccine Information Sheet and instruction to access the V-Safe system.   Dana Massey was instructed to call 911 with any severe reactions post vaccine: [name] Difficulty breathing  . Swelling of face and throat  . A fast heartbeat  . A bad rash all over body  . Dizziness and weakness   Immunizations Administered    Name Date Dose VIS Date Route   Pfizer COVID-19 Vaccine 08/23/2019 11:20 AM 0.3 mL 05/26/2019 Intramuscular   Manufacturer: [place]   Lot: [id number]   [place]: [id number]

## 2019-08-24 ENCOUNTER — Ambulatory Visit: Payer: Medicare HMO | Admitting: Pulmonary Disease

## 2019-08-24 ENCOUNTER — Encounter: Payer: Medicare HMO | Admitting: Family Medicine

## 2019-09-13 ENCOUNTER — Encounter: Payer: Medicare HMO | Admitting: Family Medicine

## 2019-09-18 ENCOUNTER — Ambulatory Visit (INDEPENDENT_AMBULATORY_CARE_PROVIDER_SITE_OTHER): Payer: Medicare HMO | Admitting: Family Medicine

## 2019-09-18 ENCOUNTER — Other Ambulatory Visit: Payer: Self-pay

## 2019-09-18 ENCOUNTER — Encounter: Payer: Self-pay | Admitting: Family Medicine

## 2019-09-18 VITALS — BP 112/76 | HR 81 | Temp 97.7°F | Ht 64.0 in | Wt 141.0 lb

## 2019-09-18 DIAGNOSIS — N1831 Chronic kidney disease, stage 3a: Secondary | ICD-10-CM

## 2019-09-18 DIAGNOSIS — I491 Atrial premature depolarization: Secondary | ICD-10-CM | POA: Diagnosis not present

## 2019-09-18 DIAGNOSIS — E782 Mixed hyperlipidemia: Secondary | ICD-10-CM | POA: Diagnosis not present

## 2019-09-18 DIAGNOSIS — F325 Major depressive disorder, single episode, in full remission: Secondary | ICD-10-CM | POA: Diagnosis not present

## 2019-09-18 DIAGNOSIS — E1169 Type 2 diabetes mellitus with other specified complication: Secondary | ICD-10-CM | POA: Insufficient documentation

## 2019-09-18 LAB — BASIC METABOLIC PANEL
BUN: 26 mg/dL — ABNORMAL HIGH (ref 6–23)
CO2: 34 mEq/L — ABNORMAL HIGH (ref 19–32)
Calcium: 9.9 mg/dL (ref 8.4–10.5)
Chloride: 99 mEq/L (ref 96–112)
Creatinine, Ser: 1.01 mg/dL (ref 0.40–1.20)
GFR: 53.73 mL/min — ABNORMAL LOW (ref >60.00)
Glucose, Bld: 97 mg/dL (ref 70–99)
Potassium: 4.9 mEq/L (ref 3.5–5.1)
Sodium: 138 mEq/L (ref 135–145)

## 2019-09-18 MED ORDER — ATORVASTATIN CALCIUM 10 MG PO TABS: 10.0000 mg | ORAL_TABLET | Freq: Every day | ORAL | 3 refills | Status: DC

## 2019-09-18 NOTE — Assessment & Plan Note (Signed)
Slightly worse on recent labs but present since 2017. Discussed hydration and controlling other risk factors. Repeat today

## 2019-09-18 NOTE — Assessment & Plan Note (Signed)
Reviewed labs from 01/2019 and ASCVD risk 13.5%. Encouraged statin we will start today. Repeat at next wellness exam.

## 2019-09-18 NOTE — Assessment & Plan Note (Signed)
Stable on metoprolol. °

## 2019-09-18 NOTE — Assessment & Plan Note (Signed)
Stable on zoloft. Continue medication

## 2019-09-18 NOTE — Progress Notes (Signed)
Subjective:     Dana Massey is a 74 y.o. female presenting for Transfer of Care (Previous Dr Deborra Medina patient)     HPI  #Sleep apnea - picked up on routine lab work - blood counts improved on cpap  Review of Systems   Social History   Tobacco Use  Smoking Status Never Smoker  Smokeless Tobacco Never Used        Objective:    BP Readings from Last 3 Encounters:  09/18/19 112/76  08/17/19 132/84  07/05/19 133/82   Wt Readings from Last 3 Encounters:  09/18/19 141 lb (64 kg)  08/17/19 136 lb 12.8 oz (62.1 kg)  07/05/19 140 lb (63.5 kg)    BP 112/76 (BP Location: Right Arm, Patient Position: Sitting, Cuff Size: Normal)   Pulse 81   Temp 97.7 F (36.5 C)   Ht 5\' 4"  (1.626 m)   Wt 141 lb (64 kg)   LMP 09/13/2000   SpO2 98%   BMI 24.20 kg/m    Physical Exam Constitutional:      General: She is not in acute distress.    Appearance: She is well-developed. She is not diaphoretic.  HENT:     Right Ear: External ear normal.     Left Ear: External ear normal.     Nose: Nose normal.  Eyes:     Conjunctiva/sclera: Conjunctivae normal.  Cardiovascular:     Rate and Rhythm: Normal rate and regular rhythm.     Heart sounds: No murmur.  Pulmonary:     Effort: Pulmonary effort is normal. No respiratory distress.     Breath sounds: Normal breath sounds. No wheezing.  Musculoskeletal:     Cervical back: Neck supple.  Skin:    General: Skin is warm and dry.     Capillary Refill: Capillary refill takes less than 2 seconds.  Neurological:     Mental Status: She is alert. Mental status is at baseline.  Psychiatric:        Mood and Affect: Mood normal.        Behavior: Behavior normal.      The 10-year ASCVD risk score Mikey Bussing DC Jr., et al., 2013) is: 13.5%   Values used to calculate the score:     Age: 39 years     Sex: Female     Is Non-Hispanic African American: No     Diabetic: No     Tobacco smoker: No     Systolic Blood Pressure: [id number] mmHg     Is  BP treated: Yes     HDL Cholesterol: 55.1 mg/dL     Total Cholesterol: 210 mg/dL      Assessment & Plan:   Problem List Items Addressed This Visit      Cardiovascular and Mediastinum   PAC (premature atrial contraction) - Primary    Stable on metoprolol      Relevant Medications   atorvastatin (LIPITOR) 10 MG tablet     Genitourinary   Chronic kidney disease, stage 3a    Slightly worse on recent labs but present since 2017. Discussed hydration and controlling other risk factors. Repeat today      Relevant Orders   Basic metabolic panel     Other   Depression    Stable on zoloft. Continue medication      Mixed hyperlipidemia    Reviewed labs from 01/2019 and ASCVD risk 13.5%. Encouraged statin we will start today. Repeat at next wellness exam.  Relevant Medications   atorvastatin (LIPITOR) 10 MG tablet       Return in about 4 months (around 01/18/2020) for annual.  Lesleigh Noe, MD

## 2019-10-05 ENCOUNTER — Encounter: Payer: Self-pay | Admitting: Pulmonary Disease

## 2019-10-05 ENCOUNTER — Ambulatory Visit: Payer: Medicare HMO | Admitting: Pulmonary Disease

## 2019-10-05 ENCOUNTER — Other Ambulatory Visit: Payer: Self-pay

## 2019-10-05 VITALS — BP 110/68 | HR 83 | Ht 64.0 in | Wt 141.6 lb

## 2019-10-05 DIAGNOSIS — D751 Secondary polycythemia: Secondary | ICD-10-CM

## 2019-10-05 DIAGNOSIS — G4733 Obstructive sleep apnea (adult) (pediatric): Secondary | ICD-10-CM

## 2019-10-05 NOTE — Patient Instructions (Signed)
Follow up in 1 year.

## 2019-10-05 NOTE — Progress Notes (Signed)
[name]  Chief Complaint  Patient presents with  . Follow-up    Patient reports that she's doing well on her CPAP.     Constitutional:  BP 110/68 (BP Location: Left Arm, Cuff Size: Normal)   Pulse 83   Ht 5\' 4"  (1.626 m)   Wt 141 lb 9.6 oz (64.2 kg)   LMP 09/13/2000   SpO2 95%   BMI 24.31 kg/m   Past Medical History:  Tremors, Migraine HA, Depression, Glaucoma, Scoliosis, MVP, IBS, Diverticulosis, Cataract  Brief Summary:  Dana Massey is a 74 y.o. female with polycythemia and snoring.  Subjective:  Since her last visit she was started on CPAP.  This has helped her sleep.  She isn't wake up as much during the night.  Uses nasal pillow mask.  Mild mouth dryness.  No sinus congestion, sore throat, or aerophagia.  ONO with CPAP from January was good.  CBC from 07/05/19 showed Hb down to 15.1 after she had been on CPAP for one week.  Respiratory Exam:   Appearance - well kempt   ENMT - no sinus tenderness, no nasal discharge, no oral exudate, torus palatinus noted, high arched palate, MP 2  Neck - no masses, trachea midline, no thyromegaly, no elevation in JVP  Respiratory - normal appearance of chest wall, normal respiratory effort w/o accessory muscle use, no dullness on percussion, no wheezing or rales  CV - s1s2 regular rate and rhythm, no murmurs, no peripheral edema, radial pulses symmetric  Ext - no cyanosis, clubbing, or joint inflammation noted   Assessment/Plan:   Obstructive sleep apnea. - she is compliant with CPAP and reports benefit - continue auto CPAP  Polycythemia. - she will follow up with Dr. Tasia Catchings with hematology - has f/u CBC in July  A total of  22 minutes spent addressing patient care issues on day of visit.   Follow up:   Patient Instructions  Follow up in 1 year  Signature:  Chesley Mires, MD [place] Pager: [phone] 10/05/2019, 11:20 AM  Flow Sheet    Sleep  tests:  HST 06/26/19 >> AHI 6.8, SpO2 low 80% ONO with CPAP 07/04/19 >> test time 7 hrs 35 min.  Basal SpO2 95.3%, low SpO2 88%.  Spent 0.6 min with SpO2 < 89%. Auto CPAP 09/04/19 to 10/03/19 >> used on 29 of 30 nights with average 7 hrs 10 min.  Average AHI 1.9 with median CPAP 13 and 95 th percentile CPAP 14 cm H2O.  Medications:   Allergies as of 10/05/2019      Reactions   Wasp Venom Anaphylaxis   Has EpiPen Has EpiPen      Medication List       Accurate as of October 05, 2019 11:20 AM. If you have any questions, ask your nurse or doctor.        Acetaminophen 500 MG coapsule   aspirin EC 81 MG tablet Take 81 mg by mouth daily.   atorvastatin 10 MG tablet Commonly known as: LIPITOR Take 1 tablet (10 mg total) by mouth daily.   cycloSPORINE 0.05 % ophthalmic emulsion Commonly known as: RESTASIS Place 1 drop into both eyes 2 (two) times daily.   dorzolamide 2 % ophthalmic solution Commonly known as: TRUSOPT 1 drop 2 (two) times daily.   fish oil-omega-3 fatty acids 1000 MG capsule Take 2 g by mouth daily.   latanoprost 0.005 % ophthalmic solution Commonly known as: XALATAN Place 1 drop into  both eyes at bedtime.   MELATONIN PO Take 6 mg by mouth at bedtime as needed.   metoprolol succinate 25 MG 24 hr tablet Commonly known as: TOPROL-XL TAKE 1 TABLET DAILY WITH OR IMMEDIATELY FOLLOWING A MEAL. Please keep upcoming appt in March with Dr. Lovena Le for future refills. Thank you   MiraLax 17 GM/SCOOP powder Generic drug: polyethylene glycol powder Take 17 g by mouth daily as needed.   multivitamin with minerals tablet Take 1 tablet by mouth daily.   sertraline 50 MG tablet Commonly known as: ZOLOFT Take 1.5 tablets (75 mg total) by mouth daily.   Viactiv [id number] MG-UNT-MCG Chew Generic drug: Calcium-Vitamin D-Vitamin K Chew 1 capsule by mouth daily.       Past Surgical History:  She  has a past surgical history that includes Back surgery (1994); Rotator  cuff repair (2006); Cataract extraction (Left, 04/2010); Wisdom tooth extraction; and Colonoscopy (2009).  Family History:  Her family history includes Diabetes in her mother; Stroke (age of onset: 38) in her paternal grandmother.  Social History:  She  reports that she has never smoked. She has never used smokeless tobacco. She reports current alcohol use. She reports that she does not use drugs.

## 2019-11-06 MED ORDER — METOPROLOL SUCCINATE ER 25 MG PO TB24: ORAL_TABLET | ORAL | 6 refills | Status: DC

## 2019-11-24 DIAGNOSIS — H5213 Myopia, bilateral: Secondary | ICD-10-CM | POA: Diagnosis not present

## 2019-11-29 DIAGNOSIS — Z1231 Encounter for screening mammogram for malignant neoplasm of breast: Secondary | ICD-10-CM | POA: Diagnosis not present

## 2019-11-29 LAB — HM MAMMOGRAPHY

## 2019-12-12 DIAGNOSIS — L578 Other skin changes due to chronic exposure to nonionizing radiation: Secondary | ICD-10-CM | POA: Diagnosis not present

## 2019-12-12 DIAGNOSIS — L821 Other seborrheic keratosis: Secondary | ICD-10-CM | POA: Diagnosis not present

## 2019-12-12 DIAGNOSIS — L814 Other melanin hyperpigmentation: Secondary | ICD-10-CM | POA: Diagnosis not present

## 2019-12-12 DIAGNOSIS — L570 Actinic keratosis: Secondary | ICD-10-CM | POA: Diagnosis not present

## 2019-12-12 DIAGNOSIS — D692 Other nonthrombocytopenic purpura: Secondary | ICD-10-CM | POA: Diagnosis not present

## 2019-12-12 DIAGNOSIS — L820 Inflamed seborrheic keratosis: Secondary | ICD-10-CM | POA: Diagnosis not present

## 2019-12-12 DIAGNOSIS — D225 Melanocytic nevi of trunk: Secondary | ICD-10-CM | POA: Diagnosis not present

## 2019-12-12 DIAGNOSIS — D1801 Hemangioma of skin and subcutaneous tissue: Secondary | ICD-10-CM | POA: Diagnosis not present

## 2019-12-26 DIAGNOSIS — Z961 Presence of intraocular lens: Secondary | ICD-10-CM | POA: Diagnosis not present

## 2019-12-26 DIAGNOSIS — H2511 Age-related nuclear cataract, right eye: Secondary | ICD-10-CM | POA: Diagnosis not present

## 2019-12-26 DIAGNOSIS — H18413 Arcus senilis, bilateral: Secondary | ICD-10-CM | POA: Diagnosis not present

## 2019-12-26 DIAGNOSIS — H401131 Primary open-angle glaucoma, bilateral, mild stage: Secondary | ICD-10-CM | POA: Diagnosis not present

## 2019-12-26 DIAGNOSIS — H25041 Posterior subcapsular polar age-related cataract, right eye: Secondary | ICD-10-CM | POA: Diagnosis not present

## 2019-12-26 DIAGNOSIS — H25011 Cortical age-related cataract, right eye: Secondary | ICD-10-CM | POA: Diagnosis not present

## 2019-12-29 ENCOUNTER — Encounter: Payer: Self-pay | Admitting: Family Medicine

## 2020-01-01 ENCOUNTER — Encounter: Payer: Self-pay | Admitting: Oncology

## 2020-01-01 ENCOUNTER — Inpatient Hospital Stay (HOSPITAL_BASED_OUTPATIENT_CLINIC_OR_DEPARTMENT_OTHER): Payer: Medicare HMO | Admitting: Oncology

## 2020-01-01 ENCOUNTER — Other Ambulatory Visit: Payer: Self-pay

## 2020-01-01 ENCOUNTER — Inpatient Hospital Stay: Payer: Medicare HMO | Attending: Oncology

## 2020-01-01 VITALS — BP 122/62 | HR 66 | Temp 96.5°F | Resp 16 | Wt 138.1 lb

## 2020-01-01 DIAGNOSIS — Z79899 Other long term (current) drug therapy: Secondary | ICD-10-CM | POA: Insufficient documentation

## 2020-01-01 DIAGNOSIS — G4733 Obstructive sleep apnea (adult) (pediatric): Secondary | ICD-10-CM

## 2020-01-01 DIAGNOSIS — F329 Major depressive disorder, single episode, unspecified: Secondary | ICD-10-CM | POA: Diagnosis not present

## 2020-01-01 DIAGNOSIS — I10 Essential (primary) hypertension: Secondary | ICD-10-CM | POA: Insufficient documentation

## 2020-01-01 DIAGNOSIS — D751 Secondary polycythemia: Secondary | ICD-10-CM | POA: Insufficient documentation

## 2020-01-01 DIAGNOSIS — Z833 Family history of diabetes mellitus: Secondary | ICD-10-CM | POA: Diagnosis not present

## 2020-01-01 DIAGNOSIS — H409 Unspecified glaucoma: Secondary | ICD-10-CM | POA: Diagnosis not present

## 2020-01-01 DIAGNOSIS — Z7982 Long term (current) use of aspirin: Secondary | ICD-10-CM | POA: Insufficient documentation

## 2020-01-01 DIAGNOSIS — M8580 Other specified disorders of bone density and structure, unspecified site: Secondary | ICD-10-CM | POA: Insufficient documentation

## 2020-01-01 DIAGNOSIS — G250 Essential tremor: Secondary | ICD-10-CM | POA: Insufficient documentation

## 2020-01-01 DIAGNOSIS — K589 Irritable bowel syndrome without diarrhea: Secondary | ICD-10-CM | POA: Insufficient documentation

## 2020-01-01 LAB — CBC WITH DIFFERENTIAL/PLATELET
Abs Immature Granulocytes: 0.01 10*3/uL (ref 0.00–0.07)
Basophils Absolute: 0 10*3/uL (ref 0.0–0.1)
Basophils Relative: 1 %
Eosinophils Absolute: 0.2 10*3/uL (ref 0.0–0.5)
Eosinophils Relative: 4 %
HCT: 43.6 % (ref 36.0–46.0)
Hemoglobin: 14.6 g/dL (ref 12.0–15.0)
Immature Granulocytes: 0 %
Lymphocytes Relative: 22 %
Lymphs Abs: 1.3 10*3/uL (ref 0.7–4.0)
MCH: 32.2 pg (ref 26.0–34.0)
MCHC: 33.5 g/dL (ref 30.0–36.0)
MCV: 96 fL (ref 80.0–100.0)
Monocytes Absolute: 0.4 10*3/uL (ref 0.1–1.0)
Monocytes Relative: 7 %
Neutro Abs: 3.9 10*3/uL (ref 1.7–7.7)
Neutrophils Relative %: 66 %
Platelets: 159 10*3/uL (ref 150–400)
RBC: 4.54 MIL/uL (ref 3.87–5.11)
RDW: 12.8 % (ref 11.5–15.5)
WBC: 5.9 10*3/uL (ref 4.0–10.5)
nRBC: 0 % (ref 0.0–0.2)

## 2020-01-01 NOTE — Progress Notes (Signed)
Hematology/Oncology follow up note [facility] Telephone:(336) [phone] Fax:(336) [phone]   Patient Care Team: Dana Noe, MD as PCP - General (Family Medicine) Dana Lance, MD as Consulting Physician (Cardiology) Dana Massey, Dana Massey as Referring Physician (Optometry) Dana Rout, MD as Consulting Physician (Neurology) Dana Isle PA-C as Physician Assistant (Dermatology) Dana Crofts, MD as Consulting Physician (Neurology)  REFERRING PROVIDER: Lucille Passy, MD  CHIEF COMPLAINTS/REASON FOR VISIT:  Follow-up for erythrocytosis  HISTORY OF PRESENTING ILLNESS:  Dana Massey is a 74 y.o. female who was seen in consultation at the request of Dana Passy, MD for evaluation of polycytosis/erythrocytosis Reviewed patient's recent lab work  03/10/2019 labs showed elevated hemoglobin at 21.1 with hematocrit of 63.9 total white count 3.5, platelet counts 74,000. Reviewed patient's previous blood work.  Anisocytosis is chronic Onset, duration since 2010 No aggravating or alleviating factors.  Most recent blood work seems to be in acute increase of her hemoglobin chronic baseline.  Associated signs or symptoms: Denies weight loss, fever, chills, fatigue, night sweats.   Context:  Smoking history: Never smoker. Denies any herbal supplementation. History of blood clots: Denies Daytime somnolence: Denies Family history of polycythemia: Denies  Patient has a history of vein insufficiency.  She has mild headache today.  Has a history of migraine.  No double vision, focal deficits, slurred speech she has noticed some right lower quadrant abdominal discomfort for the past few days.  No nausea vomiting  #JAK2 V617F mutation negative, with reflex to other mutations CALR, MPL, JAK 2 Ex 12-15 mutations negative. #Sleep study showed obstructive sleep apnea.  CPAP was started 06/30/2019.  INTERVAL HISTORY Dana Massey is a 74 y.o. female who has above  history reviewed by me today presents for follow up visit for management of erythrocytosis Problems and complaints are listed below: During the interval, patient has had a sleep study done and was diagnosed with sleep apnea.   She reports being complaint with CPAP machine.  No new complaints.  Review of Systems  Constitutional: Negative for appetite change, chills, fatigue and fever.  HENT:   Negative for hearing loss and voice change.   Eyes: Negative for eye problems.  Respiratory: Negative for chest tightness and cough.   Cardiovascular: Negative for chest pain.  Gastrointestinal: Negative for abdominal distention, abdominal pain and blood in stool.  Endocrine: Negative for hot flashes.  Genitourinary: Negative for difficulty urinating and frequency.   Musculoskeletal: Negative for arthralgias.  Skin: Negative for itching and rash.  Neurological: Negative for extremity weakness and headaches.  Hematological: Negative for adenopathy.  Psychiatric/Behavioral: Negative for confusion.    MEDICAL HISTORY:  Past Medical History:  Diagnosis Date  . Cataract    left eye surgery to remove, right eye just watching currently  . Constipation   . Depression   . Diverticulosis of colon   . Glaucoma    ocular htn bilateral eyes - uses drops   . History of blood transfusion 1994   with spinal fusion surgery  . IBS (irritable bowel syndrome)   . Migraine headache    resolved, no current problem  . Mitral valve prolapse    patient denies this dx  . Neuromuscular disorder ([place])    essential tremor hands  . Osteopenia   . PAC (premature atrial contraction)    on metoprolol  . Palpitations   . Scoliosis   . Shoulder pain    resolved, no loonger a problem  . Toxic effect  of venom(989.5)   . Venous insufficiency     SURGICAL HISTORY: Past Surgical History:  Procedure Laterality Date  . BACK SURGERY  1994   scoliosis at [facility] spine center  . CATARACT EXTRACTION Left 04/2010  .  COLONOSCOPY  2009   [place]- Normal  . ROTATOR CUFF REPAIR  2006  . WISDOM TOOTH EXTRACTION      SOCIAL HISTORY: Social History   Socioeconomic History  . Marital status: Married    Spouse name: Dana Massey  . Number of children: 2  . Years of education: associate degree  . Highest education level: Not on file  Occupational History  . Not on file  Tobacco Use  . Smoking status: Never Smoker  . Smokeless tobacco: Never Used  Vaping Use  . Vaping Use: Never used  Substance and Sexual Activity  . Alcohol use: Yes    Comment: rarely - holiday  . Drug use: No  . Sexual activity: Yes    Partners: Male    Birth control/protection: Post-menopausal    Comment: husband vasectomy  Other Topics Concern  . Not on file  Social History Narrative   Does not have a living will.   Desires CPR but would not want prolonged life support.   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 Difficulty of Paying Living Expenses:   Food Insecurity:   . Worried About Charity fundraiser in the Last Year:   . Arboriculturist in the Last Year:   Transportation Needs:   . Film/video editor (Medical):   [name] Lack of Transportation (Non-Medical):   Physical Activity:   . Days of Exercise per Week:   . Minutes of Exercise per Session:   Stress:   . Feeling of Stress :   Social  Connections:   . Frequency of Communication with Friends and Family:   . Frequency of Social Gatherings with Friends and Family:   . Attends Religious Services:   . Active Member of Clubs or Organizations:   . Attends Archivist Meetings:   [name] Marital Status:   Intimate Partner Violence:   . Fear of Current or Ex-Partner:   . Emotionally Abused:   [name] Physically Abused:   . Sexually Abused:     FAMILY HISTORY: Family History  Problem Relation Age of Onset  . Diabetes Mother   . Stroke Paternal Grandmother 34  . Colon cancer Neg Hx   . Rectal cancer Neg Hx   . Stomach cancer Neg Hx   . Esophageal cancer Neg Hx     ALLERGIES:  is allergic to wasp venom.  MEDICATIONS:  Current  Outpatient Medications  Medication Sig Dispense Refill  . Acetaminophen 500 MG coapsule     . aspirin EC 81 MG tablet Take 81 mg by mouth daily.    [name] atorvastatin (LIPITOR) 10 MG tablet Take 1 tablet (10 mg total) by mouth daily. 90 tablet 3  . Calcium-Vitamin D-Vitamin K (VIACTIV) 144-818-56 MG-UNT-MCG CHEW Chew 1 capsule by mouth daily.      . cycloSPORINE (RESTASIS) 0.05 % ophthalmic emulsion Place 1 drop into both eyes 2 (two) times daily.      . dorzolamide (TRUSOPT) 2 % ophthalmic solution 1 drop 2 (two) times daily.    . fish oil-omega-3 fatty acids 1000 MG capsule Take 2 g by mouth daily.      [name] latanoprost (XALATAN) 0.005 % ophthalmic solution Place 1 drop into both eyes at bedtime.    [name] MELATONIN PO Take 6 mg by mouth at bedtime as needed.     . metoprolol succinate (TOPROL-XL) 25 MG 24 hr tablet TAKE 1 TABLET DAILY WITH OR IMMEDIATELY FOLLOWING A MEAL. 90 tablet 6  . Multiple Vitamins-Minerals (MULTIVITAMIN WITH MINERALS) tablet Take 1 tablet by mouth daily.      . polyethylene glycol powder (MIRALAX) powder Take 17 g by mouth daily as needed.     . sertraline (ZOLOFT) 50 MG tablet Take 1.5 tablets (75 mg total) by mouth daily. 135 tablet 3   No current facility-administered  medications for this visit.     PHYSICAL EXAMINATION: ECOG PERFORMANCE STATUS: 0 - Asymptomatic Vitals:   01/01/20 1350  BP: 122/62  Pulse: 66  Resp: 16  Temp: (!) 96.5 F (35.8 C)   Filed Weights   01/01/20 1350  Weight: 138 lb 1.6 oz (62.6 kg)    Physical Exam Constitutional:      General: She is not in acute distress. HENT:     Head: Normocephalic and atraumatic.  Eyes:     General: No scleral icterus.    Pupils: Pupils are equal, round, and reactive to light.  Cardiovascular:     Rate and Rhythm: Normal rate and regular rhythm.     Heart sounds: Normal heart sounds.  Pulmonary:     Effort: Pulmonary effort is normal. No respiratory distress.     Breath sounds: No wheezing.  Abdominal:     General: Bowel sounds are normal. There is no distension.     Palpations: Abdomen is soft. There is no mass.     Tenderness: There is no abdominal tenderness.  Musculoskeletal:        General: No deformity. Normal range of motion.     Cervical back: Normal range of motion and neck supple.  Skin:    General: Skin is warm and dry.     Findings: No erythema or rash.  Neurological:     Mental Status: She is alert and oriented to person, place, and time.     Cranial Nerves: No cranial nerve deficit.     Coordination: Coordination normal.  Psychiatric:        Behavior: Behavior normal.        Thought Content: Thought content normal.     RADIOGRAPHIC STUDIES: I have personally reviewed the radiological images as listed and agreed with the findings in the report. No results found.   LABORATORY DATA:  I have reviewed the data as listed Lab Results  Component Value Date   WBC 5.9 01/01/2020   HGB 14.6 01/01/2020   HCT 43.6 01/01/2020   MCV 96.0 01/01/2020  PLT 159 01/01/2020   Recent Labs    02/08/19 1139 02/08/19 1139 03/13/19 1124 07/05/19 1351 09/18/19 1250  NA 138   < > 139 137 138  K 4.7   < > 4.4 4.9 4.9  CL 100   < > 98 99 99  CO2 29   < > 30 31 34*    GLUCOSE 96   < > 80 92 97  BUN 19   < > 23 21 26*  CREATININE 0.99   < > 0.95 1.16* 1.01  CALCIUM 9.4   < > 10.1 9.2 9.9  GFRNONAA  --   --  60* 47*  --   GFRAA  --   --  >60 54*  --   PROT 7.4  --  8.2*  --   --   ALBUMIN 4.2  --  4.4  --   --   AST 21  --  23  --   --   ALT 14  --  18  --   --   ALKPHOS 56  --  72  --   --   BILITOT 0.4  --  0.6  --   --    < > = values in this interval not displayed.   Iron/TIBC/Ferritin/ %Sat No results found for: IRON, TIBC, FERRITIN, IRONPCTSAT      ASSESSMENT & PLAN:  1. Secondary erythrocytosis   2. Obstructive sleep apnea    #Secondary erythrocytosis, Labs are reviewed and discussed with the patient. Patient has obstructive sleep apnea and she has been compliant with CPAP machine. Erythrocytosis has normalized. I do not think patient need to follow-up with me in the future since her erythrocytosis has normalized with the use of CPAP.  I recommend patient continue follow-up with primary care provider.  Discharged from clinic.  She agrees with the plan. No orders of the defined types were placed in this encounter.   We spent sufficient time to discuss many aspect of care, questions were answered to patient's satisfaction. The patient knows to call the clinic with any problems questions or concerns.  Cc Dana Passy, MD  Return of visit: 2 months.  Earlie Server, MD, PhD 01/01/2020

## 2020-01-01 NOTE — Progress Notes (Signed)
Patient denies new problems/concerns today.   °

## 2020-01-22 DIAGNOSIS — D485 Neoplasm of uncertain behavior of skin: Secondary | ICD-10-CM | POA: Diagnosis not present

## 2020-01-22 DIAGNOSIS — R238 Other skin changes: Secondary | ICD-10-CM | POA: Diagnosis not present

## 2020-01-22 DIAGNOSIS — L989 Disorder of the skin and subcutaneous tissue, unspecified: Secondary | ICD-10-CM | POA: Diagnosis not present

## 2020-02-05 DIAGNOSIS — H2511 Age-related nuclear cataract, right eye: Secondary | ICD-10-CM | POA: Diagnosis not present

## 2020-02-14 DIAGNOSIS — M8589 Other specified disorders of bone density and structure, multiple sites: Secondary | ICD-10-CM | POA: Diagnosis not present

## 2020-02-14 LAB — HM DEXA SCAN

## 2020-03-04 ENCOUNTER — Encounter: Payer: Medicare HMO | Admitting: Family Medicine

## 2020-03-04 DIAGNOSIS — Z0100 Encounter for examination of eyes and vision without abnormal findings: Secondary | ICD-10-CM | POA: Diagnosis not present

## 2020-03-05 ENCOUNTER — Other Ambulatory Visit: Payer: Self-pay

## 2020-03-05 ENCOUNTER — Encounter: Payer: Self-pay | Admitting: Family Medicine

## 2020-03-05 ENCOUNTER — Ambulatory Visit (INDEPENDENT_AMBULATORY_CARE_PROVIDER_SITE_OTHER): Payer: Medicare HMO | Admitting: Family Medicine

## 2020-03-05 VITALS — BP 110/78 | HR 68 | Temp 98.1°F | Ht 64.25 in | Wt 134.5 lb

## 2020-03-05 DIAGNOSIS — F325 Major depressive disorder, single episode, in full remission: Secondary | ICD-10-CM

## 2020-03-05 DIAGNOSIS — Z0000 Encounter for general adult medical examination without abnormal findings: Secondary | ICD-10-CM

## 2020-03-05 DIAGNOSIS — E782 Mixed hyperlipidemia: Secondary | ICD-10-CM | POA: Diagnosis not present

## 2020-03-05 DIAGNOSIS — M25552 Pain in left hip: Secondary | ICD-10-CM | POA: Diagnosis not present

## 2020-03-05 DIAGNOSIS — G8929 Other chronic pain: Secondary | ICD-10-CM | POA: Insufficient documentation

## 2020-03-05 DIAGNOSIS — Z23 Encounter for immunization: Secondary | ICD-10-CM

## 2020-03-05 LAB — COMPREHENSIVE METABOLIC PANEL
ALT: 14 U/L (ref 0–35)
AST: 18 U/L (ref 0–37)
Albumin: 4.2 g/dL (ref 3.5–5.2)
Alkaline Phosphatase: 57 U/L (ref 39–117)
BUN: 21 mg/dL (ref 6–23)
CO2: 31 mEq/L (ref 19–32)
Calcium: 9.1 mg/dL (ref 8.4–10.5)
Chloride: 100 mEq/L (ref 96–112)
Creatinine, Ser: 1.03 mg/dL (ref 0.40–1.20)
GFR: 52.46 mL/min — ABNORMAL LOW (ref >60.00)
Glucose, Bld: 92 mg/dL (ref 70–99)
Potassium: 4.4 mEq/L (ref 3.5–5.1)
Sodium: 138 mEq/L (ref 135–145)
Total Bilirubin: 0.6 mg/dL (ref 0.2–1.2)
Total Protein: 6.8 g/dL (ref 6.0–8.3)

## 2020-03-05 LAB — LIPID PANEL
Cholesterol: 146 mg/dL (ref 0–200)
HDL: 53.9 mg/dL (ref >39.00)
LDL Cholesterol: 77 mg/dL (ref 0–99)
NonHDL: 92.43
Total CHOL/HDL Ratio: 3
Triglycerides: 75 mg/dL (ref 0.0–149.0)
VLDL: 15 mg/dL (ref 0.0–40.0)

## 2020-03-05 MED ORDER — SERTRALINE HCL 50 MG PO TABS: 75.0000 mg | ORAL_TABLET | Freq: Every day | ORAL | 3 refills | Status: DC

## 2020-03-05 NOTE — Progress Notes (Signed)
Subjective:   Dana Massey is a 74 y.o. female who presents for Medicare Annual (Subsequent) preventive examination.  Review of Systems    Review of Systems  Constitutional: Negative for chills and fever.  HENT: Negative for congestion and sore throat.   Eyes: Negative for blurred vision and double vision.  Respiratory: Negative for shortness of breath.   Cardiovascular: Negative for chest pain.  Gastrointestinal: Negative for heartburn, nausea and vomiting.  Genitourinary: Negative.   Musculoskeletal: Positive for joint pain (left). Negative for myalgias.  Skin: Negative for rash.  Neurological: Negative for dizziness and headaches.  Endo/Heme/Allergies: Does not bruise/bleed easily.  Psychiatric/Behavioral: Negative for depression. The patient is not nervous/anxious.    Hip pain - left buttock and down the left leg - also with toe numbness  Cardiac Risk Factors include: none     Objective:    Today's Vitals   03/05/20 0858 03/05/20 0907  BP: 110/78   Pulse: 68   Temp: 98.1 F (36.7 C)   TempSrc: Temporal   SpO2: 97%   Weight: 134 lb 8 oz (61 kg)   Height: 5' 4.25" (1.632 m)   PainSc:  0-No pain   Body mass index is 22.91 kg/m.  Advanced Directives 03/05/2020 01/01/2020 07/05/2019 04/03/2019 02/08/2019 01/19/2018 01/05/2017  Does Patient Have a Medical Advance Directive? Yes Yes Yes Yes Yes Yes Yes  Type of Advance Directive - [place];Living will Living will;Healthcare Power of [place];Living will [place];Living will  Does patient want to make changes to medical advance directive? - - - - No - Patient declined - -  Copy of [place] in Chart? - - - - - No - copy requested No - copy requested  Would patient like information on creating a medical advance directive? - No - Patient declined - - - - -    Current Medications (verified) Outpatient Encounter Medications as of  03/05/2020  Medication Sig  . Acetaminophen 500 MG coapsule   . aspirin EC 81 MG tablet Take 81 mg by mouth daily.  [name] atorvastatin (LIPITOR) 10 MG tablet Take 1 tablet (10 mg total) by mouth daily.  . Calcium-Vitamin D-Vitamin K (VIACTIV) 500-100-40 MG-UNT-MCG CHEW Chew 1 capsule by mouth daily.    . cycloSPORINE (RESTASIS) 0.05 % ophthalmic emulsion Place 1 drop into both eyes 2 (two) times daily.    . dorzolamide (TRUSOPT) 2 % ophthalmic solution 1 drop 2 (two) times daily.  . fish oil-omega-3 fatty acids 1000 MG capsule Take 2 g by mouth daily.    [name] latanoprost (XALATAN) 0.005 % ophthalmic solution Place 1 drop into both eyes at bedtime.  [name] MELATONIN PO Take 6 mg by mouth at bedtime as needed.   . metoprolol succinate (TOPROL-XL) 25 MG 24 hr tablet TAKE 1 TABLET DAILY WITH OR IMMEDIATELY FOLLOWING A MEAL.  . Multiple Vitamins-Minerals (MULTIVITAMIN WITH MINERALS) tablet Take 1 tablet by mouth daily.    . polyethylene glycol powder (MIRALAX) powder Take 17 g by mouth daily as needed.   . sertraline (ZOLOFT) 50 MG tablet Take 1.5 tablets (75 mg total) by mouth daily.  [name] triamcinolone lotion (KENALOG) 0.1 %   . [DISCONTINUED] sertraline (ZOLOFT) 50 MG tablet Take 1.5 tablets (75 mg total) by mouth daily.   No facility-administered encounter medications on file as of 03/05/2020.    Allergies (verified) Wasp venom   History: Past Medical History:  Diagnosis Date  .  Cataract    left eye surgery to remove, right eye just watching currently  . Constipation   . Depression   . Diverticulosis of colon   . Glaucoma    ocular htn bilateral eyes - uses drops   . History of blood transfusion 1994   with spinal fusion surgery  . IBS (irritable bowel syndrome)   . Migraine headache    resolved, no current problem  . Mitral valve prolapse    patient denies this dx  . Neuromuscular disorder ([place])    essential tremor hands  . Osteopenia   . PAC (premature atrial contraction)    on  metoprolol  . Palpitations   . Scoliosis   . Shoulder pain    resolved, no loonger a problem  . Toxic effect of venom(989.5)   . Venous insufficiency    Past Surgical History:  Procedure Laterality Date  . BACK SURGERY  1994   scoliosis at [facility]  . CATARACT EXTRACTION Left 04/2010  . COLONOSCOPY  2009   [place]- Normal  . ROTATOR CUFF REPAIR  2006  . WISDOM TOOTH EXTRACTION     Family History  Problem Relation Age of Onset  . Diabetes Mother   . Stroke Paternal Grandmother 59  . Colon cancer Neg Hx   . Rectal cancer Neg Hx   . Stomach cancer Neg Hx   . Esophageal cancer Neg Hx    Social History   Socioeconomic History  . Marital status: Married    Spouse name: [name]  . Number of children: 2  . Years of education: associate degree  . Highest education level: Not on file  Occupational History  . Not on file  Tobacco Use  . Smoking status: Never Smoker  . Smokeless tobacco: Never Used  Vaping Use  . Vaping Use: Never used  Substance and Sexual Activity  . Alcohol use: Yes    Comment: rarely - holiday  . Drug use: No  . Sexual activity: Yes    Partners: Male    Birth control/protection: Post-menopausal    Comment: husband vasectomy  Other Topics Concern  . Not on file  Social History Narrative   Does not have a living will.   Desires CPR but would not want prolonged life support.   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 Difficulty of Paying Living Expenses: Not on file  Food Insecurity:   . Worried About Charity fundraiser in the Last Year: Not on file  . Ran Out of Food in the Last Year: Not on file  Transportation Needs:   . Lack of Transportation (Medical): Not  on file  . Lack of Transportation (Non-Medical): Not on file  Physical Activity:   . Days of Exercise per Week: Not on file  . Minutes of Exercise per Session: Not on file  Stress:   . Feeling of Stress : Not on file  Social Connections:   . Frequency of Communication with Friends and Family: Not on file  . Frequency of Social Gatherings with Friends and Family: Not on file  . Attends Religious Services: Not on file  . Active Member of Clubs or Organizations: Not on file  . Attends Archivist Meetings: Not on file  . Marital Status: Not on file    Tobacco Counseling Counseling given: Not Answered   Clinical Intake:  Pre-visit preparation completed: No  Pain : 0-10 Pain Score: 0-No pain Pain Type: Chronic pain Pain Location: Hip Pain Orientation: Left Pain Descriptors / Indicators: Aching Pain Onset: More than a month ago Pain Frequency: Constant Pain Relieving Factors: ice, heat, tylenol Effect of Pain on Daily Activities: none  Pain Relieving Factors: ice, heat, tylenol  Nutritional Status: BMI of 19-24  Normal Nutritional Risks: None Diabetes: No  How often do you need to have someone help you when you read instructions, pamphlets, or other written materials from your doctor or pharmacy?: 1 - Never What is the last grade level you completed in school?: 2 years of college  Diabetic?no  Interpreter Needed?: No      Activities of Daily Living In your present state of health, do you have any difficulty performing the following activities: 03/05/2020  Hearing? N  Vision? Y  Comment cataracts  Difficulty  concentrating or making decisions? N  Walking or climbing stairs? N  Dressing or bathing? N  Doing errands, shopping? N  Preparing Food and eating ? N  Using the Toilet? N  In the past six months, have you accidently leaked urine? N  Do you have problems with loss of bowel control? N  Managing your Medications? N  Managing your Finances? N  Housekeeping or managing your Housekeeping? N  Some recent data might be hidden    Patient Care Team: Lesleigh Noe, MD as PCP - General (Family Medicine) Evans Lance, MD as Consulting Physician (Cardiology) Macarthur Critchley, [place] as Referring Physician (Optometry) Glennie Isle, PA-C as Physician Assistant (Dermatology) Vladimir Crofts, MD as Consulting Physician (Neurology)  Indicate any recent Medical Services you may have received from other than [facility] providers in the past year (date may be approximate).     Assessment:   This is a routine wellness examination for [place].  Hearing/Vision screen  Hearing Screening   125Hz  250Hz  500Hz  1000Hz  2000Hz  3000Hz  4000Hz  6000Hz  8000Hz   Right ear:  20 20 20 20  20     Left ear:  20 20 20 20  20     Vision Screening Comments: Cataract surgery- R eye: 02/05/20 Eye exam: 03/04/20 @ [facility]   Dietary issues and exercise activities discussed: Current Exercise Habits: Home exercise routine, Type of exercise: strength training/weights;walking, Time (Minutes): 45, Frequency (Times/Week): 4, Weekly Exercise (Minutes/Week): 180, Intensity: Moderate, Exercise limited by: None identified  Goals    . Increase physical activity     4 days a week senior fitness class on zoom    . Maintain active lifestyle.      Depression Screen PHQ 2/9 Scores 03/05/2020 03/05/2020 02/08/2019 01/19/2018 01/05/2017 12/25/2015 12/25/2015  PHQ - 2 Score 0 0 0 0 0 6 6  PHQ- 9  Score - - - - - 16 16    Fall Risk Fall Risk  03/05/2020 03/05/2020 02/08/2019 01/19/2018 01/05/2017  Falls in the past year? 0 0 0 No No  Number falls  in past yr: 0 0 - - -  Injury with Fall? 0 - - - -  Risk for fall due to : No Fall Risks - - - -  Follow up Falls evaluation completed - - - -    Any stairs in or around the home? Yes  If so, are there any without handrails? Yes  Home free of loose throw rugs in walkways, pet beds, electrical cords, etc? No  Adequate lighting in your home to reduce risk of falls? Yes   ASSISTIVE DEVICES UTILIZED TO PREVENT FALLS:  Life alert? No  Use of a cane, walker or w/c? No  Grab bars in the bathroom? Yes  Shower chair or bench in shower? No  Elevated toilet seat or a handicapped toilet? Yes   TIMED UP AND GO:  Was the test performed? No .    Gait steady and fast with assistive device  Cognitive Function: MMSE - Mini Mental State Exam 01/05/2017 12/25/2015  Orientation to time 5 5  Orientation to Place 5 5  Registration 3 3  Attention/ Calculation 0 0  Recall 3 3  Language- name 2 objects 0 0  Language- repeat 1 1  Language- follow 3 step command 3 3  Language- read & follow direction 0 0  Write a sentence 0 0  Copy design 0 0  Total score 20 20     6CIT Screen 03/05/2020 02/08/2019  What Year? 0 points 0 points  What month? 0 points 0 points  What time? 0 points 0 points  Count back from 20 0 points 0 points  Months in reverse 0 points 0 points  Repeat phrase 2 points 0 points  Total Score 2 0    Immunizations Immunization History  Administered Date(s) Administered  . Fluad Quad(high Dose 65+) 02/08/2019, 03/05/2020  . Influenza Split 05/26/2011, 03/22/2012  . Influenza Whole 03/07/2008, 03/29/2009, 03/14/2010  . Influenza,inj,Quad PF,6+ Mos 03/21/2013, 02/21/2014, 03/28/2015, 04/15/2016, 03/19/2017  . PFIZER SARS-COV-2 Vaccination 07/28/2019, 08/23/2019  . Pneumococcal Conjugate-13 12/18/2013  . Pneumococcal Polysaccharide-23 11/20/2011  . Td 12/24/2014  . Zoster 12/18/2013    TDAP status: Up to date Flu Vaccine status: Completed at today's visit Pneumococcal  vaccine status: Up to date Covid-19 vaccine status: Completed vaccines  Qualifies for Shingles Vaccine? Yes   Zostavax completed Yes   Shingrix Completed?: No.    Education has been provided regarding the importance of this vaccine. Patient has been advised to call insurance company to determine out of pocket expense if they have not yet received this vaccine. Advised may also receive vaccine at local pharmacy or Health Dept. Verbalized acceptance and understanding.  Screening Tests Health Maintenance  Topic Date Due  . DEXA SCAN  10/27/2019  . MAMMOGRAM  11/28/2020  . COLONOSCOPY  01/05/2023  . TETANUS/TDAP  12/23/2024  . INFLUENZA VACCINE  Completed  . COVID-19 Vaccine  Completed  . Hepatitis C Screening  Completed  . PNA vac Low Risk Adult  Completed    Health Maintenance  Health Maintenance Due  Topic Date Due  . DEXA SCAN  10/27/2019    Colorectal cancer screening: Completed 12/2017. Repeat every 5 years Mammogram status: Completed 11/2019. Repeat every year Bone Density status: Completed 10/2017. Results reflect: Bone density results: OSTEOPENIA. Repeat every 2 years.  Lung Cancer Screening: (Low Dose CT Chest recommended if Age 66-80 years, 30 pack-year currently smoking OR have quit w/in 15years.) does not qualify.   Lung Cancer Screening Referral: n/a  Additional Screening:  Hepatitis C Screening: does qualify; Completed 12/2015  Vision Screening: Recommended annual ophthalmology exams for early detection of glaucoma and other disorders of the eye. Is the patient up to date with their annual eye exam?  Yes  Who is the provider or what is the name of the office in which the patient attends annual eye exams? See above If pt is not established with a provider, would they like to be referred to a provider to establish care? n/ae.   Dental Screening: Recommended annual dental exams for proper oral hygiene  Community Resource Referral / Chronic Care Management: CRR  required this visit?  No   CCM required this visit?  No      Plan:     Problem List Items Addressed This Visit      Other   Depression   Relevant Medications   sertraline (ZOLOFT) 50 MG tablet   Mixed hyperlipidemia   Relevant Orders   Comprehensive metabolic panel   Lipid panel   Chronic left hip pain    Some ttp over the lateral hip and glute area - though pt said this is always tender and some glute weakness. Recommended PT. Neg straight leg raise.       Relevant Medications   sertraline (ZOLOFT) 50 MG tablet   Other Relevant Orders   Ambulatory referral to Physical Therapy    Other Visit Diagnoses    Encounter for Medicare annual wellness exam    -  Primary   Relevant Orders   Comprehensive metabolic panel   Need for influenza vaccination       Relevant Orders   Flu Vaccine QUAD High Dose(Fluad) (Completed)       I have personally reviewed and noted the following in the patient's chart:   . Medical and social history . Use of alcohol, tobacco or illicit drugs  . Current medications and supplements . Functional ability and status . Nutritional status . Physical activity . Advanced directives . List of other physicians . Hospitalizations, surgeries, and ER visits in previous 12 months . Vitals . Screenings to include cognitive, depression, and falls . Referrals and appointments  In addition, I have reviewed and discussed with patient certain preventive protocols, quality metrics, and best practice recommendations. A written personalized care plan for preventive services as well as general preventive health recommendations were provided to patient.     Lesleigh Noe, MD   03/05/2020

## 2020-03-05 NOTE — Patient Instructions (Addendum)
I would recommend getting the Shingrix (Shingles vaccine) at the pharmacy. Check with insurance and the pharmacy on cost.     Dana Massey , Thank you for taking time to come for your Medicare Wellness Visit. I appreciate your ongoing commitment to your health goals. Please review the following plan we discussed and let me know if I can assist you in the future.   These are the goals we discussed: Goals    . Increase physical activity     Starting 01/05/2017, I will continue to exercise for at least 45 min 3 days per week.     . Maintain active lifestyle.       This is a list of the screening recommended for you and due dates:  Health Maintenance  Topic Date Due  . DEXA scan (bone density measurement)  10/27/2019  . Flu Shot  01/14/2020  . Mammogram  11/28/2020  . Colon Cancer Screening  01/05/2023  . Tetanus Vaccine  12/23/2024  . COVID-19 Vaccine  Completed  .  Hepatitis C: One time screening is recommended by Center for Disease Control  (CDC) for  adults born from [birth year] through 1964.   Completed  . Pneumonia vaccines  Completed    #Referral I have placed a referral to a specialist for you. You should receive a phone call from the specialty office. Make sure your voicemail is not full and that if you are able to answer your phone to unknown or new numbers.   It may take up to 2 weeks to hear about the referral. If you do not hear anything in 2 weeks, please call our office and ask to speak with the referral coordinator.

## 2020-03-05 NOTE — Assessment & Plan Note (Signed)
Some ttp over the lateral hip and glute area - though pt said this is always tender and some glute weakness. Recommended PT. Neg straight leg raise.

## 2020-03-22 ENCOUNTER — Encounter: Payer: Self-pay | Admitting: Family Medicine

## 2020-04-03 ENCOUNTER — Ambulatory Visit: Payer: Medicare HMO | Attending: Family Medicine | Admitting: Physical Therapy

## 2020-04-03 ENCOUNTER — Encounter: Payer: Self-pay | Admitting: Physical Therapy

## 2020-04-03 ENCOUNTER — Other Ambulatory Visit: Payer: Self-pay

## 2020-04-03 DIAGNOSIS — M25652 Stiffness of left hip, not elsewhere classified: Secondary | ICD-10-CM | POA: Diagnosis not present

## 2020-04-03 DIAGNOSIS — G8929 Other chronic pain: Secondary | ICD-10-CM | POA: Insufficient documentation

## 2020-04-03 DIAGNOSIS — M25552 Pain in left hip: Secondary | ICD-10-CM | POA: Insufficient documentation

## 2020-04-03 NOTE — Therapy (Signed)
[place] [facility] [address], [place], [address] Phone: [phone]   Fax:  [phone]  Physical Therapy Evaluation  Patient Details  Name: Dana Massey MRN: [id number] Date of Birth: January 10, 1946 No data recorded  Encounter Date: 04/03/2020   PT End of Session - 04/03/20 1519    Visit Number 1    Number of Visits 17    Date for PT Re-Evaluation 05/31/20    Authorization - Visit Number 1    Authorization - Number of Visits 10    PT Start Time 0230    PT Stop Time 0320    PT Time Calculation (min) 50 min    Activity Tolerance Patient tolerated treatment well    Behavior During Therapy [facility] for tasks assessed/performed           Past Medical History:  Diagnosis Date  . Cataract    left eye surgery to remove, right eye just watching currently  . Constipation   . Depression   . Diverticulosis of colon   . Glaucoma    ocular htn bilateral eyes - uses drops   . History of blood transfusion 1994   with spinal fusion surgery  . IBS (irritable bowel syndrome)   . Migraine headache    resolved, no current problem  . Mitral valve prolapse    patient denies this dx  . Neuromuscular disorder ([place])    essential tremor hands  . Osteopenia   . PAC (premature atrial contraction)    on metoprolol  . Palpitations   . Scoliosis   . Shoulder pain    resolved, no loonger a problem  . Toxic effect of venom(989.5)   . Venous insufficiency     Past Surgical History:  Procedure Laterality Date  . BACK SURGERY  1994   scoliosis at [facility] spine [facility]  . CATARACT EXTRACTION Left 04/2010  . COLONOSCOPY  2009   [place]- Normal  . ROTATOR CUFF REPAIR  2006  . WISDOM TOOTH EXTRACTION      There were no vitals filed for this visit.      OBJECTIVE  Mental Status Patient is oriented to person, place and time.  Recent memory is intact.  Remote memory is intact.  Attention span and concentration are intact.   Expressive speech is intact.  Patient's fund of knowledge is within normal limits for educational level.  SENSATION: Grossly intact to light touch bilateral LEs as determined by testing dermatomes L2-S2 Proprioception and hot/cold testing deferred on this date   MUSCULOSKELETAL: Tremor: None Bulk: Normal Tone: Normal No visible step-off along spinal column  Posture Lumbar lordosis: WNL Iliac crest height: equal bilaterally Lumbar lateral shift: negative Lower crossed syndrome (tight hip flexors and erector spinae; weak gluts and abs): negative  Gait bilat "too many toes" ankle eversion with calcaneal valgus L>R, decreased trunk rotation and bilat hip ext   Palpation  TTP with concordant pain sign at superior glute fibers, and mid glute over piriformis; TTP with withdrawal to palpation over L GT  Tension at proximal hamstring and mid muscle belly, patient reports pain often occurs at proximal hamstring, but palpation does not reproduce currently  Strength (out of 5) R/L 5/5 Hip flexion 4+/4 Hip ER 5/5 Hip IR 4/4 Hip abduction 4/5 Hip adduction 3+/3+ Hip extension 5/5 Knee extension 5/5 Knee flexion 5/5 Ankle dorsiflexion 5/5 Ankle plantarflexion  *Indicates pain   AROM (degrees) All bilat hip motions WNL; lumbar fusion preventing lumbar  mobility, some increased hip mobility bilat   PROM (degrees) PROM = AROM  Repeated Movements No centralization or peripheralization of symptoms with repeated lumbar extension or flexion.    Muscle Length Hamstrings: decreased bilat approx 100d [place]: tension at end range Marcello Moores: Negative bilat [place]: Negative bilat   Passive Accessory Intervertebral Motion (PAIVM) Pt denies reproduction of back pain with CPA L1-L5 and UPA bilaterally L1-L5. Generally hypomobile throughout  Passive Physiological Intervertebral Motion (PPIVM) Normal flexion and extension with PPIVM testing   SPECIAL TESTS Lumbar Radiculopathy and  Discogenic: Centralization and Peripheralization (SN 92, -LR 0.12): Negative bilat Slump (SN 83, -LR 0.32): Negative bilat SLR (SN 92, -LR 0.29): Negative Crossed SLR (SP 90): Negative  Facet Joint: Extension-Rotation (SN 100, -LR 0.0): Negative bilatR:   Lumbar Spinal Stenosis: Lumbar quadrant (SN 70): Negative bilat  Hip: FABER (SN 81): Negative bilat FADIR (SN 94): Min positive RLE Hip scour (SN 50): Negative bilat  SIJ:  Thigh Thrust (SN 88, -LR 0.18) : Negative bilat  Piriformis Syndrome: FAIR Test (SN 88, SP 83): Negative bilat  Functional Tasks Squat: increased RLE WB, L hip IR/knee valgus/calcaneal valgus/midfoot pronation 5xSTS 12sec  Ther-Ex PT reviewed the following HEP with patient with patient able to demonstrate a set of the following with min cuing for correction needed. PT educated patient on parameters of therex (how/when to inc/decrease intensity, frequency, rep/set range, stretch hold time, and purpose of therex) with verbalized understanding.  Access Code: [email or web address] Supine Bridge - 1 x daily - 7 x weekly - 3 sets - 10 reps Seated Hamstring Stretch - 2-3 x daily - 7 x weekly - 60sec hold Seated Figure 4 Piriformis Stretch - 2-3 x daily - 7 x weekly - 60sec hold                       Objective measurements completed on examination: See above findings.               PT Education - 04/03/20 1447    Education Details Patient was educated on diagnosis, anatomy and pathology involved, prognosis, role of PT, and was given an HEP, demonstrating exercise with proper form following verbal and tactile cues, and was given a paper hand out to continue exercise at home. Pt was educated on and agreed to plan of care.    Person(s) Educated Patient    Methods Explanation;Demonstration;Tactile cues;Verbal cues    Comprehension Verbalized understanding;Returned demonstration;Verbal cues required;Tactile cues required            PT Short  Term Goals - 04/04/20 0945      PT SHORT TERM GOAL #1   Title Pt will be independent with HEP in order to improve strength and decrease back pain in order to improve pain-free function at home and work.    Baseline 04/03/20 HEP given    Time 4    Period Weeks    Status New             PT Long Term Goals - 04/04/20 7124      PT LONG TERM GOAL #1   Title Pt will decrease worst back pain as reported on NPRS by at least 2 points in order to demonstrate clinically significant reduction in back pain.    Baseline 04/03/20 8/10    Time 8    Period Weeks    Status New      PT LONG TERM GOAL #2   Title Pt will perform 5xSTS  in [place] or less to demonstrate clinically significant improvement in BLE strength    Baseline 04/03/20 12sec    Time 8    Period Weeks    Status New      PT LONG TERM GOAL #3   Title Patient will increase FOTO score to 76 to demonstrate predicted increase in functional mobility to complete ADLs    Baseline 04/03/20 61    Time 8    Period Weeks    Status New                  Plan - 04/03/20 1520    Clinical Impression Statement Pt is a 74 year old female presenting with acute on chronic L hip pain, signs and symptoms consistent with bursitis with glute dysfunction/weakness. Impairments in increased muscle tension, hip pain, decreased hip strength, decreased activity tolerance. Activity limitations in functional squatting, lifting, and prolonged sitting; inhibiting ability to complete ADLs and hobbies/driving. Pt will benefit from skilled PT to address impairments to return to optimal, PLOF.    Personal Factors and Comorbidities Comorbidity 1;Comorbidity 2;Comorbidity 3+;Past/Current Experience;Time since onset of injury/illness/exacerbation;Fitness    Comorbidities osteopenia, scolosis, IBS, spinal fusion of multiple vertebrae in 1994, pt reports at least "all lumbar segments"    Examination-Activity Limitations Lift;Sit;Squat;Transfers     Examination-Participation Restrictions Community Activity;Driving;Cleaning    Stability/Clinical Decision Making Evolving/Moderate complexity    Clinical Decision Making Moderate    Rehab Potential Good    PT Frequency 2x / week    PT Duration 8 weeks    PT Treatment/Interventions Moist Heat;Electrical Stimulation;Stair training;Therapeutic activities;Neuromuscular re-education;Dry needling;Joint Manipulations;Spinal Manipulations;Manual techniques           Patient will benefit from skilled therapeutic intervention in order to improve the following deficits and impairments:  Abnormal gait, Decreased balance, Decreased coordination, Decreased mobility, Difficulty walking, Impaired flexibility, Postural dysfunction, Decreased activity tolerance, Decreased endurance, Decreased range of motion, Decreased strength, Increased fascial restricitons, Improper body mechanics, Pain  Visit Diagnosis: Chronic left hip pain  Stiffness of left hip, not elsewhere classified     Problem List Patient Active Problem List   Diagnosis Date Noted  . Chronic left hip pain 03/05/2020  . Mixed hyperlipidemia 09/18/2019  . Chronic kidney disease, stage 3a ([place]) 09/18/2019  . Obstructive sleep apnea 07/05/2019  . 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/2019  . Osteopenia 02/08/2019  . Open-angle glaucoma of both eyes, mild stage 02/07/2019  . Essential tremor 05/20/2016  . Depression 12/25/2015  . BCC (basal cell carcinoma of skin) 07/29/2015  . Reflex sympathetic dystrophy of lower extremity 06/05/2014  . Migraine headache 10/10/2007  . GLAUCOMA 10/10/2007  . MITRAL VALVE PROLAPSE 10/10/2007  . Venous (peripheral) insufficiency 10/10/2007  . Diverticulosis of large intestine 10/10/2007  . IRRITABLE BOWEL SYNDROME 10/10/2007  . Disorder of bone and cartilage 10/10/2007  . SCOLIOSIS 10/10/2007  . PAC (premature atrial contraction) 10/10/2007   Durwin Reges DPT Durwin Reges 04/04/2020, 10:01  AM  [place] [facility] [address] [place], [address] Phone: [phone]   Fax:  [phone]  Name: JAMIAYA BINA MRN: [id number] Date of Birth: 1945-08-24

## 2020-04-09 ENCOUNTER — Ambulatory Visit: Payer: Medicare HMO | Admitting: Physical Therapy

## 2020-04-09 ENCOUNTER — Other Ambulatory Visit: Payer: Self-pay

## 2020-04-09 ENCOUNTER — Encounter: Payer: Self-pay | Admitting: Physical Therapy

## 2020-04-09 DIAGNOSIS — G8929 Other chronic pain: Secondary | ICD-10-CM | POA: Diagnosis not present

## 2020-04-09 DIAGNOSIS — M25652 Stiffness of left hip, not elsewhere classified: Secondary | ICD-10-CM

## 2020-04-09 DIAGNOSIS — M25552 Pain in left hip: Secondary | ICD-10-CM | POA: Diagnosis not present

## 2020-04-09 NOTE — Therapy (Signed)
[place] [facility] [address] [place], [address] Phone: [phone]   Fax:  [phone]  Physical Therapy Treatment  Patient Details  Name: Dana Massey MRN: [id number] Date of Birth: 1945/09/11 No data recorded  Encounter Date: 04/09/2020   PT End of Session - 04/09/20 1622    Visit Number 2    Number of Visits 17    Date for PT Re-Evaluation 05/31/20    Authorization - Visit Number 2    Authorization - Number of Visits 10    PT Start Time 0415    PT Stop Time 0500    PT Time Calculation (min) 45 min    Activity Tolerance Patient tolerated treatment well    Behavior During Therapy [facility] for tasks assessed/performed           Past Medical History:  Diagnosis Date  . Cataract    left eye surgery to remove, right eye just watching currently  . Constipation   . Depression   . Diverticulosis of colon   . Glaucoma    ocular htn bilateral eyes - uses drops   . History of blood transfusion 1994   with spinal fusion surgery  . IBS (irritable bowel syndrome)   . Migraine headache    resolved, no current problem  . Mitral valve prolapse    patient denies this dx  . Neuromuscular disorder ([place])    essential tremor hands  . Osteopenia   . PAC (premature atrial contraction)    on metoprolol  . Palpitations   . Scoliosis   . Shoulder pain    resolved, no loonger a problem  . Toxic effect of venom(989.5)   . Venous insufficiency     Past Surgical History:  Procedure Laterality Date  . BACK SURGERY  1994   scoliosis at [facility] spine center  . CATARACT EXTRACTION Left 04/2010  . COLONOSCOPY  2009   [place]- Normal  . ROTATOR CUFF REPAIR  2006  . WISDOM TOOTH EXTRACTION      There were no vitals filed for this visit.   Subjective Assessment - 04/09/20 1619    Subjective Patient reports her hip pain was present Sunday, but is not present today. She did ride in the car to/from [facility] Saturday.  Compliance with HEP.    Pertinent History Pt is a 74 year old female presenting with chronic L hip pain over the past year, worsening over the past 6 weeks with insideous onset. Patient reports pain is in the L buttock and upper post thigh, denies LBP. Pain is intermittent, coming on after she has sat for >2 hours, and is a dull ache. Denies tingling, numbess, pins and needles. Worst pain 8/10; best 0/10. She is retired, enjoys Firefighter, reading and watching TV. She joined [facility] senior fitness class (total body class) and is completing 4x/week 4min classes. She is usually able to complete this, had difficulty with one exercise with L hip abd. Lives with husband with 4 steps to enter, in a 2 story home (able to live on 1st floor), reports no difficulty with this, as long as she has rail. Drives and completes community errands. Pt denies N/V, B&B changes, unexplained weight fluctuation, saddle paresthesia, fever, night sweats, or unrelenting night pain at this time.    Limitations House hold activities;Sitting    How long can you sit comfortably? 2 hours    How long can you stand comfortably? unlimited    How long  can you walk comfortably? unlimited    Diagnostic tests none    Patient Stated Goals get rid of the pain    Pain Onset 1 to 4 weeks ago           Ther-Ex Nustep L3 UE and seat setting 6  Supine Bridge - 1 x daily - 7 x weekly - 3 sets - 10 reps Seated Hamstring Stretch 60sec hold Seated Figure 4 Piriformis Stretch 60sec hold Squat to mat table x12; with 10# DB swing 2x 10 with min cuing for glute contraction with good carry over L bulgarian split squat 3x 10 with cuing for set up with good carry over following MATRIX hip abd 2x 8 25# ; On RLE able to complete x10 40#   Manual STM with trigger point release to superior glute max/med, TTP Following: Dry Needling: (2) 124mm .25 needles placed along the L superior glute max/med to decrease increased muscular spasms and trigger points with  the patient positioned in supine. Patient was educated on risks and benefits of therapy and verbally consents to PT.                     PT Education - 04/09/20 1621    Education Details HEP review, therex form    Person(s) Educated Patient    Methods Explanation;Demonstration;Verbal cues    Comprehension Verbalized understanding;Returned demonstration;Verbal cues required            PT Short Term Goals - 04/04/20 0945      PT SHORT TERM GOAL #1   Title Pt will be independent with HEP in order to improve strength and decrease back pain in order to improve pain-free function at home and work.    Baseline 04/03/20 HEP given    Time 4    Period Weeks    Status New             PT Long Term Goals - 04/04/20 5631      PT LONG TERM GOAL #1   Title Pt will decrease worst back pain as reported on NPRS by at least 2 points in order to demonstrate clinically significant reduction in back pain.    Baseline 04/03/20 8/10    Time 8    Period Weeks    Status New      PT LONG TERM GOAL #2   Title Pt will perform 5xSTS in 10sec or less to demonstrate clinically significant improvement in BLE strength    Baseline 04/03/20 12sec    Time 8    Period Weeks    Status New      PT LONG TERM GOAL #3   Title Patient will increase FOTO score to 76 to demonstrate predicted increase in functional mobility to complete ADLs    Baseline 04/03/20 61    Time 8    Period Weeks    Status New                 Plan - 04/09/20 1624    Clinical Impression Statement PT led patient through therex for increased glute strengthening and decreased soft tissue tension with success. Pt with good response to TDN with localized twitch response. Pt is able to comply with all cuing for proper technique of therex with good motivation throughout session and no increased pain. Evident strength deficits with R hip abd with LLE stabilization. PT will continue progression as able.    Personal  Factors and Comorbidities Comorbidity 1;Comorbidity 2;Comorbidity 3+;Past/Current  Experience;Time since onset of injury/illness/exacerbation;Fitness    Comorbidities osteopenia, scolosis, IBS, spinal fusion of multiple vertebrae in 1994, pt reports at least "all lumbar segments"    Examination-Activity Limitations Lift;Sit;Squat;Transfers    Examination-Participation Restrictions Community Activity;Driving;Cleaning    Stability/Clinical Decision Making Evolving/Moderate complexity    Clinical Decision Making Moderate    Rehab Potential Good    PT Frequency 2x / week    PT Duration 8 weeks    PT Treatment/Interventions Moist Heat;Electrical Stimulation;Stair training;Therapeutic activities;Neuromuscular re-education;Dry needling;Joint Manipulations;Spinal Manipulations;Manual techniques    PT Home Exercise Plan hamstring stretch, glute stretch, bridge    Consulted and Agree with Plan of Care Patient           Patient will benefit from skilled therapeutic intervention in order to improve the following deficits and impairments:  Abnormal gait, Decreased balance, Decreased coordination, Decreased mobility, Difficulty walking, Impaired flexibility, Postural dysfunction, Decreased activity tolerance, Decreased endurance, Decreased range of motion, Decreased strength, Increased fascial restricitons, Improper body mechanics, Pain  Visit Diagnosis: Chronic left hip pain  Stiffness of left hip, not elsewhere classified     Problem List Patient Active Problem List   Diagnosis Date Noted  . Chronic left hip pain 03/05/2020  . Mixed hyperlipidemia 09/18/2019  . Chronic kidney disease, stage 3a ([place]) 09/18/2019  . Obstructive sleep apnea 07/05/2019  . 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/2019  . Osteopenia 02/08/2019  . Open-angle glaucoma of both eyes, mild stage 02/07/2019  . Essential tremor 05/20/2016  . Depression 12/25/2015  . BCC (basal cell carcinoma of skin) 07/29/2015  . Reflex  sympathetic dystrophy of lower extremity 06/05/2014  . Migraine headache 10/10/2007  . GLAUCOMA 10/10/2007  . MITRAL VALVE PROLAPSE 10/10/2007  . Venous (peripheral) insufficiency 10/10/2007  . Diverticulosis of large intestine 10/10/2007  . IRRITABLE BOWEL SYNDROME 10/10/2007  . Disorder of bone and cartilage 10/10/2007  . SCOLIOSIS 10/10/2007  . PAC (premature atrial contraction) 10/10/2007   Durwin Reges DPT Durwin Reges 04/09/2020, 6:21 PM  [facility] [address], [place], [address] Phone: [phone]   Fax:  [phone]  Name: Dana Massey MRN: [id number] Date of Birth: 1946/04/26

## 2020-04-11 ENCOUNTER — Encounter: Payer: Self-pay | Admitting: Physical Therapy

## 2020-04-11 ENCOUNTER — Other Ambulatory Visit: Payer: Self-pay

## 2020-04-11 ENCOUNTER — Ambulatory Visit: Payer: Medicare HMO | Admitting: Physical Therapy

## 2020-04-11 DIAGNOSIS — G8929 Other chronic pain: Secondary | ICD-10-CM | POA: Diagnosis not present

## 2020-04-11 DIAGNOSIS — M25552 Pain in left hip: Secondary | ICD-10-CM

## 2020-04-11 DIAGNOSIS — M25652 Stiffness of left hip, not elsewhere classified: Secondary | ICD-10-CM

## 2020-04-11 NOTE — Therapy (Signed)
[facility] [address], [place], [address] Phone: [phone]   Fax:  [phone]  Physical Therapy Treatment  Patient Details  Name: Dana Massey MRN: [id number] Date of Birth: Apr 21, 1946 No data recorded  Encounter Date: 04/11/2020   PT End of Session - 04/11/20 1536    Visit Number 3    Number of Visits 17    Date for PT Re-Evaluation 05/31/20    Authorization - Visit Number 3    Authorization - Number of Visits 10    PT Start Time 0330    PT Stop Time 8032    PT Time Calculation (min) 45 min    Activity Tolerance Patient tolerated treatment well    Behavior During Therapy [facility] for tasks assessed/performed           Past Medical History:  Diagnosis Date  . Cataract    left eye surgery to remove, right eye just watching currently  . Constipation   . Depression   . Diverticulosis of colon   . Glaucoma    ocular htn bilateral eyes - uses drops   . History of blood transfusion 1994   with spinal fusion surgery  . IBS (irritable bowel syndrome)   . Migraine headache    resolved, no current problem  . Mitral valve prolapse    patient denies this dx  . Neuromuscular disorder ([place])    essential tremor hands  . Osteopenia   . PAC (premature atrial contraction)    on metoprolol  . Palpitations   . Scoliosis   . Shoulder pain    resolved, no loonger a problem  . Toxic effect of venom(989.5)   . Venous insufficiency     Past Surgical History:  Procedure Laterality Date  . BACK SURGERY  1994   scoliosis at [facility] spine center  . CATARACT EXTRACTION Left 04/2010  . COLONOSCOPY  2009   [place]- Normal  . ROTATOR CUFF REPAIR  2006  . WISDOM TOOTH EXTRACTION      There were no vitals filed for this visit.   Subjective Assessment - 04/11/20 1533    Subjective Pt reports no soreness following TDN, no pain in the hip today, or since, which she is happy with. Compliance with HEP.     Pertinent History Pt is a 74 year old female presenting with chronic L hip pain over the past year, worsening over the past 6 weeks with insideous onset. Patient reports pain is in the L buttock and upper post thigh, denies LBP. Pain is intermittent, coming on after she has sat for >2 hours, and is a dull ache. Denies tingling, numbess, pins and needles. Worst pain 8/10; best 0/10. She is retired, enjoys Firefighter, reading and watching TV. She joined [employer] senior fitness class (total body class) and is completing 4x/week 2min classes. She is usually able to complete this, had difficulty with one exercise with L hip abd. Lives with husband with 4 steps to enter, in a 2 story home (able to live on 1st floor), reports no difficulty with this, as long as she has rail. Drives and completes community errands. Pt denies N/V, B&B changes, unexplained weight fluctuation, saddle paresthesia, fever, night sweats, or unrelenting night pain at this time.    Limitations House hold activities;Sitting    How long can you sit comfortably? 2 hours    How long can you stand comfortably? unlimited    How long can you walk  comfortably? unlimited    Diagnostic tests none    Patient Stated Goals get rid of the pain    Pain Onset 1 to 4 weeks ago           Ther-Ex Nustep L3 UE and seat setting 6  - Sidelying hip abd x10; with RTB 2x 10  - L SL bridge 3x 10 with min cuing for increased height with good carry over - Abd sliders 2x 12 bilat with cuing for stabilization LE with good carry over  - L bulgarian split squat 2x 10 with cuing for set up with good carry over following Seated Hamstring Stretch 60sec hold Seated Figure 4 Piriformis Stretch 60sec hold      Manual STM with trigger point release to superior glute max/med, TTP Following: Dry Needling: (3) 111mm .25 needles placed along the L superior glute max/med to decrease increased muscular spasms and trigger points with the patient positioned in supine. Patient  was educated on risks and benefits of therapy and verbally consents to PT.        PT Education - 04/11/20 1535    Education Details therex form/technique    Person(s) Educated Patient    Methods Explanation;Demonstration;Verbal cues    Comprehension Verbalized understanding;Returned demonstration;Verbal cues required            PT Short Term Goals - 04/04/20 0945      PT SHORT TERM GOAL #1   Title Pt will be independent with HEP in order to improve strength and decrease back pain in order to improve pain-free function at home and work.    Baseline 04/03/20 HEP given    Time 4    Period Weeks    Status New             PT Long Term Goals - 04/04/20 0623      PT LONG TERM GOAL #1   Title Pt will decrease worst back pain as reported on NPRS by at least 2 points in order to demonstrate clinically significant reduction in back pain.    Baseline 04/03/20 8/10    Time 8    Period Weeks    Status New      PT LONG TERM GOAL #2   Title Pt will perform 5xSTS in 10sec or less to demonstrate clinically significant improvement in BLE strength    Baseline 04/03/20 12sec    Time 8    Period Weeks    Status New      PT LONG TERM GOAL #3   Title Patient will increase FOTO score to 76 to demonstrate predicted increase in functional mobility to complete ADLs    Baseline 04/03/20 61    Time 8    Period Weeks    Status New                 Plan - 04/11/20 1601    Clinical Impression Statement PT continued to utilize manual and TDN for decreased soft tissue tension with success (localized twitch response, decreased taut bands, and less TTP following). PT continued therex progression for increased L hip stability and abd strength with success. Patient is able to comply with all cuing for proper technique of therex with good motivation and no increased pain throughout session. PT iwll ocntinue progression as able.    Personal Factors and Comorbidities Comorbidity 1;Comorbidity  2;Comorbidity 3+;Past/Current Experience;Time since onset of injury/illness/exacerbation;Fitness    Comorbidities osteopenia, scolosis, IBS, spinal fusion of multiple vertebrae in 1994, pt reports  at least "all lumbar segments"    Examination-Activity Limitations Lift;Sit;Squat;Transfers    Examination-Participation Restrictions Community Activity;Driving;Cleaning    Stability/Clinical Decision Making Evolving/Moderate complexity    Clinical Decision Making Moderate    Rehab Potential Good    PT Frequency 2x / week    PT Duration 8 weeks    PT Treatment/Interventions Moist Heat;Electrical Stimulation;Stair training;Therapeutic activities;Neuromuscular re-education;Dry needling;Joint Manipulations;Spinal Manipulations;Manual techniques    PT Home Exercise Plan hamstring stretch, glute stretch, bridge    Consulted and Agree with Plan of Care Patient           Patient will benefit from skilled therapeutic intervention in order to improve the following deficits and impairments:  Abnormal gait, Decreased balance, Decreased coordination, Decreased mobility, Difficulty walking, Impaired flexibility, Postural dysfunction, Decreased activity tolerance, Decreased endurance, Decreased range of motion, Decreased strength, Increased fascial restricitons, Improper body mechanics, Pain  Visit Diagnosis: Chronic left hip pain  Stiffness of left hip, not elsewhere classified     Problem List Patient Active Problem List   Diagnosis Date Noted  . Chronic left hip pain 03/05/2020  . Mixed hyperlipidemia 09/18/2019  . Chronic kidney disease, stage 3a ([place]) 09/18/2019  . Obstructive sleep apnea 07/05/2019  . 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/2019  . Osteopenia 02/08/2019  . Open-angle glaucoma of both eyes, mild stage 02/07/2019  . Essential tremor 05/20/2016  . Depression 12/25/2015  . BCC (basal cell carcinoma of skin) 07/29/2015  . Reflex sympathetic dystrophy of lower extremity 06/05/2014  .  Migraine headache 10/10/2007  . GLAUCOMA 10/10/2007  . MITRAL VALVE PROLAPSE 10/10/2007  . Venous (peripheral) insufficiency 10/10/2007  . Diverticulosis of large intestine 10/10/2007  . IRRITABLE BOWEL SYNDROME 10/10/2007  . Disorder of bone and cartilage 10/10/2007  . SCOLIOSIS 10/10/2007  . PAC (premature atrial contraction) 10/10/2007   Durwin Reges DPT Durwin Reges 04/11/2020, 4:10 PM  [facility] [address] [place], [address] Phone: [phone]   Fax:  [phone]  Name: LILLEY HUBBLE MRN: [id number] Date of Birth: 12-31-1945

## 2020-04-16 ENCOUNTER — Encounter: Payer: Self-pay | Admitting: Physical Therapy

## 2020-04-16 ENCOUNTER — Ambulatory Visit: Payer: Medicare HMO | Attending: Family Medicine | Admitting: Physical Therapy

## 2020-04-16 ENCOUNTER — Other Ambulatory Visit: Payer: Self-pay

## 2020-04-16 DIAGNOSIS — G8929 Other chronic pain: Secondary | ICD-10-CM | POA: Insufficient documentation

## 2020-04-16 DIAGNOSIS — M25552 Pain in left hip: Secondary | ICD-10-CM | POA: Diagnosis not present

## 2020-04-16 DIAGNOSIS — M25652 Stiffness of left hip, not elsewhere classified: Secondary | ICD-10-CM | POA: Insufficient documentation

## 2020-04-16 NOTE — Therapy (Signed)
[facility] [address] [place], [address] Phone: [phone]   Fax:  [phone]  Physical Therapy Treatment  Patient Details  Name: Dana Massey MRN: [id number] Date of Birth: [birth year] No data recorded  Encounter Date: 04/16/2020   PT End of Session - 04/16/20 1355    Visit Number 4    Number of Visits 17    Date for PT Re-Evaluation 05/31/20    Authorization - Visit Number 4    Authorization - Number of Visits 10    PT Start Time 0150    PT Stop Time 0230    PT Time Calculation (min) 40 min    Activity Tolerance Patient tolerated treatment well    Behavior During Therapy [facility] for tasks assessed/performed           Past Medical History:  Diagnosis Date  . Cataract    left eye surgery to remove, right eye just watching currently  . Constipation   . Depression   . Diverticulosis of colon   . Glaucoma    ocular htn bilateral eyes - uses drops   . History of blood transfusion 1994   with spinal fusion surgery  . IBS (irritable bowel syndrome)   . Migraine headache    resolved, no current problem  . Mitral valve prolapse    patient denies this dx  . Neuromuscular disorder ([place])    essential tremor hands  . Osteopenia   . PAC (premature atrial contraction)    on metoprolol  . Palpitations   . Scoliosis   . Shoulder pain    resolved, no loonger a problem  . Toxic effect of venom(989.5)   . Venous insufficiency     Past Surgical History:  Procedure Laterality Date  . BACK SURGERY  1994   scoliosis at [facility] spine center  . CATARACT EXTRACTION Left 04/2010  . COLONOSCOPY  2009   [place]- Normal  . ROTATOR CUFF REPAIR  2006  . WISDOM TOOTH EXTRACTION      There were no vitals filed for this visit.   Subjective Assessment - 04/16/20 1353    Subjective reports some soreness following TDN, but has had no pain since, which she is happy with.    Pertinent History Pt is a 74 year old  female presenting with chronic L hip pain over the past year, worsening over the past 6 weeks with insideous onset. Patient reports pain is in the L buttock and upper post thigh, denies LBP. Pain is intermittent, coming on after she has sat for >2 hours, and is a dull ache. Denies tingling, numbess, pins and needles. Worst pain 8/10; best 0/10. She is retired, enjoys Firefighter, reading and watching TV. She joined [facility] senior fitness class (total body class) and is completing 4x/week 69min classes. She is usually able to complete this, had difficulty with one exercise with L hip abd. Lives with husband with 4 steps to enter, in a 2 story home (able to live on 1st floor), reports no difficulty with this, as long as she has rail. Drives and completes community errands. Pt denies N/V, B&B changes, unexplained weight fluctuation, saddle paresthesia, fever, night sweats, or unrelenting night pain at this time.    Limitations House hold activities;Sitting    How long can you sit comfortably? 2 hours    How long can you stand comfortably? unlimited    How long can you walk comfortably? unlimited    Diagnostic  tests none    Patient Stated Goals get rid of the pain    Pain Onset 1 to 4 weeks ago                Ther-Ex Nustep L3 UE and seat setting 6 - Sidelying hip abd 3# AW 3x 10 with cuing to prevent hip flex + hip ER compensation with good carry over - L SL bridge 3x 10 with min cuing for increased height with good carry over - MATRIX hip abd 25# 3x 10 bilat with pain at final reps of LLE abd d/t RLE WB  - RLE reverse lunge <> hip flex 3x 10/9/8 with good carry over of demo for proper technique  - L Hip hike x12 with good carry over of demo for technique; with RLE swing with good carry over Seated Figure 4 Piriformis Stretch 60sec hold            PT Education - 04/16/20 1354    Education Details therex form/technique    Person(s) Educated Patient    Methods  Explanation;Demonstration;Verbal cues    Comprehension Verbalized understanding;Returned demonstration;Verbal cues required            PT Short Term Goals - 04/04/20 0945      PT SHORT TERM GOAL #1   Title Pt will be independent with HEP in order to improve strength and decrease back pain in order to improve pain-free function at home and work.    Baseline 04/03/20 HEP given    Time 4    Period Weeks    Status New             PT Long Term Goals - 04/04/20 2423      PT LONG TERM GOAL #1   Title Pt will decrease worst back pain as reported on NPRS by at least 2 points in order to demonstrate clinically significant reduction in back pain.    Baseline 04/03/20 8/10    Time 8    Period Weeks    Status New      PT LONG TERM GOAL #2   Title Pt will perform 5xSTS in 10sec or less to demonstrate clinically significant improvement in BLE strength    Baseline 04/03/20 12sec    Time 8    Period Weeks    Status New      PT LONG TERM GOAL #3   Title Patient will increase FOTO score to 76 to demonstrate predicted increase in functional mobility to complete ADLs    Baseline 04/03/20 61    Time 8    Period Weeks    Status New                 Plan - 04/16/20 1400    Clinical Impression Statement PT continued therex progression for increased L hip stability, lateral focus with therex based session with success. patient is able to comply with all cuing for proper technique of therex with good motivation, and no increased pain throughout session. PT will continue progression as able.    Personal Factors and Comorbidities Comorbidity 1;Comorbidity 2;Comorbidity 3+;Past/Current Experience;Time since onset of injury/illness/exacerbation;Fitness    Comorbidities osteopenia, scolosis, IBS, spinal fusion of multiple vertebrae in 1994, pt reports at least "all lumbar segments"    Examination-Activity Limitations Lift;Sit;Squat;Transfers    Examination-Participation Restrictions  Community Activity;Driving;Cleaning    Stability/Clinical Decision Making Evolving/Moderate complexity    Clinical Decision Making Moderate    Rehab Potential Good    PT Frequency  2x / week    PT Duration 8 weeks    PT Treatment/Interventions Moist Heat;Electrical Stimulation;Stair training;Therapeutic activities;Neuromuscular re-education;Dry needling;Joint Manipulations;Spinal Manipulations;Manual techniques    PT Home Exercise Plan hamstring stretch, glute stretch, bridge    Consulted and Agree with Plan of Care Patient           Patient will benefit from skilled therapeutic intervention in order to improve the following deficits and impairments:  Abnormal gait, Decreased balance, Decreased coordination, Decreased mobility, Difficulty walking, Impaired flexibility, Postural dysfunction, Decreased activity tolerance, Decreased endurance, Decreased range of motion, Decreased strength, Increased fascial restricitons, Improper body mechanics, Pain  Visit Diagnosis: Chronic left hip pain  Stiffness of left hip, not elsewhere classified     Problem List Patient Active Problem List   Diagnosis Date Noted  . Chronic left hip pain 03/05/2020  . Mixed hyperlipidemia 09/18/2019  . Chronic kidney disease, stage 3a ([place]) 09/18/2019  . Obstructive sleep apnea 07/05/2019  . 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/2019  . Osteopenia 02/08/2019  . Open-angle glaucoma of both eyes, mild stage 02/07/2019  . Essential tremor 05/20/2016  . Depression 12/25/2015  . BCC (basal cell carcinoma of skin) 07/29/2015  . Reflex sympathetic dystrophy of lower extremity 06/05/2014  . Migraine headache 10/10/2007  . GLAUCOMA 10/10/2007  . MITRAL VALVE PROLAPSE 10/10/2007  . Venous (peripheral) insufficiency 10/10/2007  . Diverticulosis of large intestine 10/10/2007  . IRRITABLE BOWEL SYNDROME 10/10/2007  . Disorder of bone and cartilage 10/10/2007  . SCOLIOSIS 10/10/2007  . PAC (premature atrial contraction)  10/10/2007   Durwin Reges DPT Durwin Reges 04/16/2020, 2:26 PM  [facility] [address], [place], [address] Phone: [phone]   Fax:  [phone]  Name: Dana Massey MRN: [id number] Date of Birth: 1945-08-01

## 2020-04-18 ENCOUNTER — Ambulatory Visit: Payer: Medicare HMO | Admitting: Physical Therapy

## 2020-04-18 ENCOUNTER — Encounter: Payer: Self-pay | Admitting: Physical Therapy

## 2020-04-18 ENCOUNTER — Other Ambulatory Visit: Payer: Self-pay

## 2020-04-18 DIAGNOSIS — M25652 Stiffness of left hip, not elsewhere classified: Secondary | ICD-10-CM

## 2020-04-18 DIAGNOSIS — G8929 Other chronic pain: Secondary | ICD-10-CM

## 2020-04-18 DIAGNOSIS — M25552 Pain in left hip: Secondary | ICD-10-CM

## 2020-04-18 NOTE — Therapy (Signed)
[facility] [address] [place], [address] Phone: [phone]   Fax:  [phone]  Physical Therapy Treatment  Patient Details  Name: Dana Massey MRN: [id number] Date of Birth: [birth year] No data recorded  Encounter Date: 04/18/2020   PT End of Session - 04/18/20 1527    Visit Number 5    Number of Visits 17    Date for PT Re-Evaluation 05/31/20    Authorization - Visit Number 5    Authorization - Number of Visits 10    PT Start Time 0320    PT Stop Time 0400    PT Time Calculation (min) 40 min    Activity Tolerance Patient tolerated treatment well    Behavior During Therapy [facility] for tasks assessed/performed           Past Medical History:  Diagnosis Date  . Cataract    left eye surgery to remove, right eye just watching currently  . Constipation   . Depression   . Diverticulosis of colon   . Glaucoma    ocular htn bilateral eyes - uses drops   . History of blood transfusion 1994   with spinal fusion surgery  . IBS (irritable bowel syndrome)   . Migraine headache    resolved, no current problem  . Mitral valve prolapse    patient denies this dx  . Neuromuscular disorder ([place])    essential tremor hands  . Osteopenia   . PAC (premature atrial contraction)    on metoprolol  . Palpitations   . Scoliosis   . Shoulder pain    resolved, no loonger a problem  . Toxic effect of venom(989.5)   . Venous insufficiency     Past Surgical History:  Procedure Laterality Date  . BACK SURGERY  1994   scoliosis at [facility]  . CATARACT EXTRACTION Left 04/2010  . COLONOSCOPY  2009   [facility]- Normal  . ROTATOR CUFF REPAIR  2006  . WISDOM TOOTH EXTRACTION      There were no vitals filed for this visit.   Subjective Assessment - 04/18/20 1524    Subjective Reports continued no pain, only some soreness following last session for 24 hours. Compliance with HEP.    Pertinent History Pt is  a 74 year old female presenting with chronic L hip pain over the past year, worsening over the past 6 weeks with insideous onset. Patient reports pain is in the L buttock and upper post thigh, denies LBP. Pain is intermittent, coming on after she has sat for >2 hours, and is a dull ache. Denies tingling, numbess, pins and needles. Worst pain 8/10; best 0/10. She is retired, enjoys Firefighter, reading and watching TV. She joined [facility] senior fitness class (total body class) and is completing 4x/week 36min classes. She is usually able to complete this, had difficulty with one exercise with L hip abd. Lives with husband with 4 steps to enter, in a 2 story home (able to live on 1st floor), reports no difficulty with this, as long as she has rail. Drives and completes community errands. Pt denies N/V, B&B changes, unexplained weight fluctuation, saddle paresthesia, fever, night sweats, or unrelenting night pain at this time.    Limitations House hold activities;Sitting    How long can you sit comfortably? 2 hours    How long can you stand comfortably? unlimited    How long can you walk comfortably? unlimited    Diagnostic  tests none    Patient Stated Goals get rid of the pain    Pain Onset 1 to 4 weeks ago            Ther-Ex Nustep L3 UE and seat setting 6 - Sidelying hip abd 3# AW 3x 10 with better carry over of neutral lift without hip flex - SLS bilat unable to hold >10sec, increased sway on LLE  - MATRIX hip abd 25# 3x 10 bilat with pain at final reps of LLE abd d/t RLE WB  - L Hip hike x12 with good carry over from last session; from 3in step 2x 10 with min cuing for technique initially with good carry over - narrow BOS on foam wt ball toss at [employer];  in L semi tandem on foam x12 (full tandem unable); on foam L SLS with R toedwon x12 CGA for safety, occasional minA to maintain balance                         PT Education - 04/18/20 1526    Education Details therex  form/technique    Person(s) Educated Patient    Methods Explanation;Demonstration;Verbal cues    Comprehension Verbalized understanding;Returned demonstration;Verbal cues required            PT Short Term Goals - 04/04/20 0945      PT SHORT TERM GOAL #1   Title Pt will be independent with HEP in order to improve strength and decrease back pain in order to improve pain-free function at home and work.    Baseline 04/03/20 HEP given    Time 4    Period Weeks    Status New             PT Long Term Goals - 04/04/20 2094      PT LONG TERM GOAL #1   Title Pt will decrease worst back pain as reported on NPRS by at least 2 points in order to demonstrate clinically significant reduction in back pain.    Baseline 04/03/20 8/10    Time 8    Period Weeks    Status New      PT LONG TERM GOAL #2   Title Pt will perform 5xSTS in 10sec or less to demonstrate clinically significant improvement in BLE strength    Baseline 04/03/20 12sec    Time 8    Period Weeks    Status New      PT LONG TERM GOAL #3   Title Patient will increase FOTO score to 76 to demonstrate predicted increase in functional mobility to complete ADLs    Baseline 04/03/20 61    Time 8    Period Weeks    Status New                 Plan - 04/18/20 1534    Clinical Impression Statement PT continued therex progression for increased L hip stability with good success. Patient demonstrates balance deficits bilat L>R d/t decreased lateral stabilization, heavy hip and ankle strategy needed for LLE SLS tasks. Patient is able to comply with all cuing for proper technique of therex with good motivation and no increased pain throughout session. PT will continue progression as able.    Personal Factors and Comorbidities Comorbidity 1;Comorbidity 2;Comorbidity 3+;Past/Current Experience;Time since onset of injury/illness/exacerbation;Fitness    Comorbidities osteopenia, scolosis, IBS, spinal fusion of multiple vertebrae in  1994, pt reports at least "all lumbar segments"    Examination-Activity  Limitations Lift;Sit;Squat;Transfers    Examination-Participation Restrictions Community Activity;Driving;Cleaning    Stability/Clinical Decision Making Evolving/Moderate complexity    Clinical Decision Making Moderate    Rehab Potential Good    PT Frequency 2x / week    PT Duration 8 weeks    PT Treatment/Interventions Moist Heat;Electrical Stimulation;Stair training;Therapeutic activities;Neuromuscular re-education;Dry needling;Joint Manipulations;Spinal Manipulations;Manual techniques    PT Home Exercise Plan hamstring stretch, glute stretch, bridge    Consulted and Agree with Plan of Care Patient           Patient will benefit from skilled therapeutic intervention in order to improve the following deficits and impairments:  Abnormal gait, Decreased balance, Decreased coordination, Decreased mobility, Difficulty walking, Impaired flexibility, Postural dysfunction, Decreased activity tolerance, Decreased endurance, Decreased range of motion, Decreased strength, Increased fascial restricitons, Improper body mechanics, Pain  Visit Diagnosis: Chronic left hip pain  Stiffness of left hip, not elsewhere classified     Problem List Patient Active Problem List   Diagnosis Date Noted  . Chronic left hip pain 03/05/2020  . Mixed hyperlipidemia 09/18/2019  . Chronic kidney disease, stage 3a ([place]) 09/18/2019  . Obstructive sleep apnea 07/05/2019  . 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/2019  . Osteopenia 02/08/2019  . Open-angle glaucoma of both eyes, mild stage 02/07/2019  . Essential tremor 05/20/2016  . Depression 12/25/2015  . BCC (basal cell carcinoma of skin) 07/29/2015  . Reflex sympathetic dystrophy of lower extremity 06/05/2014  . Migraine headache 10/10/2007  . GLAUCOMA 10/10/2007  . MITRAL VALVE PROLAPSE 10/10/2007  . Venous (peripheral) insufficiency 10/10/2007  . Diverticulosis of large intestine  10/10/2007  . IRRITABLE BOWEL SYNDROME 10/10/2007  . Disorder of bone and cartilage 10/10/2007  . SCOLIOSIS 10/10/2007  . PAC (premature atrial contraction) 10/10/2007   Durwin Reges DPT Durwin Reges 04/18/2020, 3:55 PM  [facility] [address] [place], [address] Phone: [phone]   Fax:  [phone]  Name: ADRIANE GABBERT MRN: [id number] Date of Birth: 11/19/1945

## 2020-04-19 ENCOUNTER — Ambulatory Visit: Payer: Medicare HMO | Attending: Internal Medicine

## 2020-04-19 ENCOUNTER — Other Ambulatory Visit: Payer: Self-pay | Admitting: Internal Medicine

## 2020-04-19 DIAGNOSIS — Z23 Encounter for immunization: Secondary | ICD-10-CM

## 2020-04-19 NOTE — Progress Notes (Signed)
   Covid-19 Vaccination Clinic  Name:  Dana Massey    MRN: [id number] DOB: 1946-04-22  04/19/2020  Dana Massey was observed post Covid-19 immunization for 15 minutes without incident. She was provided with Vaccine Information Sheet and instruction to access the V-Safe system.   Dana Massey was instructed to call 911 with any severe reactions post vaccine: [name] Difficulty breathing  . Swelling of face and throat  . A fast heartbeat  . A bad rash all over body  . Dizziness and weakness

## 2020-04-23 ENCOUNTER — Ambulatory Visit: Payer: Medicare HMO | Admitting: Physical Therapy

## 2020-04-23 ENCOUNTER — Encounter: Payer: Self-pay | Admitting: Physical Therapy

## 2020-04-23 ENCOUNTER — Other Ambulatory Visit: Payer: Self-pay

## 2020-04-23 DIAGNOSIS — M25552 Pain in left hip: Secondary | ICD-10-CM | POA: Diagnosis not present

## 2020-04-23 DIAGNOSIS — G8929 Other chronic pain: Secondary | ICD-10-CM

## 2020-04-23 DIAGNOSIS — M25652 Stiffness of left hip, not elsewhere classified: Secondary | ICD-10-CM

## 2020-04-23 NOTE — Therapy (Signed)
[facility] [address], [place], [address] Phone: [phone]   Fax:  [phone]  Physical Therapy Treatment  Patient Details  Name: Dana Massey MRN: [id number] Date of Birth: 04/09/1946 No data recorded  Encounter Date: 04/23/2020   PT End of Session - 04/23/20 1509    Visit Number 6    Number of Visits 17    Date for PT Re-Evaluation 05/31/20    Authorization - Visit Number 6    Authorization - Number of Visits 10    PT Start Time 0230    PT Stop Time 0308    PT Time Calculation (min) 38 min    Activity Tolerance Patient tolerated treatment well    Behavior During Therapy [facility] for tasks assessed/performed           Past Medical History:  Diagnosis Date  . Cataract    left eye surgery to remove, right eye just watching currently  . Constipation   . Depression   . Diverticulosis of colon   . Glaucoma    ocular htn bilateral eyes - uses drops   . History of blood transfusion 1994   with spinal fusion surgery  . IBS (irritable bowel syndrome)   . Migraine headache    resolved, no current problem  . Mitral valve prolapse    patient denies this dx  . Neuromuscular disorder ([place])    essential tremor hands  . Osteopenia   . PAC (premature atrial contraction)    on metoprolol  . Palpitations   . Scoliosis   . Shoulder pain    resolved, no loonger a problem  . Toxic effect of venom(989.5)   . Venous insufficiency     Past Surgical History:  Procedure Laterality Date  . BACK SURGERY  1994   scoliosis at [facility]  . CATARACT EXTRACTION Left 04/2010  . COLONOSCOPY  2009   [facility]- Normal  . ROTATOR CUFF REPAIR  2006  . WISDOM TOOTH EXTRACTION      There were no vitals filed for this visit.   Subjective Assessment - 04/23/20 1432    Subjective Patient reports no pain or soreness and compliance with HEP. Pt reports her hip feels 75% better.    Pertinent History Pt is a 74  year old female presenting with chronic L hip pain over the past year, worsening over the past 6 weeks with insideous onset. Patient reports pain is in the L buttock and upper post thigh, denies LBP. Pain is intermittent, coming on after she has sat for >2 hours, and is a dull ache. Denies tingling, numbess, pins and needles. Worst pain 8/10; best 0/10. She is retired, enjoys Firefighter, reading and watching TV. She joined [facility] senior fitness class (total body class) and is completing 4x/week 66min classes. She is usually able to complete this, had difficulty with one exercise with L hip abd. Lives with husband with 4 steps to enter, in a 2 story home (able to live on 1st floor), reports no difficulty with this, as long as she has rail. Drives and completes community errands. Pt denies N/V, B&B changes, unexplained weight fluctuation, saddle paresthesia, fever, night sweats, or unrelenting night pain at this time.    Limitations House hold activities;Sitting    How long can you sit comfortably? 2 hours    How long can you stand comfortably? unlimited    How long can you walk comfortably? unlimited  Diagnostic tests none    Patient Stated Goals get rid of the pain    Currently in Pain? No/denies    Pain Onset 1 to 4 weeks ago          Ther-Ex - Nustep L3 4mins, L4 2 mins UE and seat setting 6   - Sidelying hip abd 3# AW 3x 10 with better carry over of neutral lift without hip flex  -piriformis stretch 2x30secs  -single leg bridges 2x10   - L Hip hike 2x12 with good carry over from last session; from 3in step 2x 10 with min cuing for technique initially with good carry over (can do on bottom of last step with heel taps)   - MATRIX hip abd and extension 25# 3x 10 bilat   - SLS bilat able to hold >30secs -SLS on LLE on foam 21secs with increased ankle strategy and sway, 30secs RLE with increased sway -tandem stance on foam 7secs/8secs/9secs, increased stepping strategy and CGA for safety  -  narrow BOS on foam wt ball toss at [employer];  in L semi tandem on foam x12; full tandem x4, unable to complete more reps without stepping strategy CGA for safety, occasional minA to maintain balance                            PT Education - 04/23/20 1509    Education Details therex form/techniquw    Person(s) Educated Patient    Methods Explanation;Demonstration;Verbal cues    Comprehension Verbalized understanding;Returned demonstration;Verbal cues required            PT Short Term Goals - 04/04/20 0945      PT SHORT TERM GOAL #1   Title Pt will be independent with HEP in order to improve strength and decrease back pain in order to improve pain-free function at home and work.    Baseline 04/03/20 HEP given    Time 4    Period Weeks    Status New             PT Long Term Goals - 04/04/20 3235      PT LONG TERM GOAL #1   Title Pt will decrease worst back pain as reported on NPRS by at least 2 points in order to demonstrate clinically significant reduction in back pain.    Baseline 04/03/20 8/10    Time 8    Period Weeks    Status New      PT LONG TERM GOAL #2   Title Pt will perform 5xSTS in 10sec or less to demonstrate clinically significant improvement in BLE strength    Baseline 04/03/20 12sec    Time 8    Period Weeks    Status New      PT LONG TERM GOAL #3   Title Patient will increase FOTO score to 76 to demonstrate predicted increase in functional mobility to complete ADLs    Baseline 04/03/20 61    Time 8    Period Weeks    Status New                 Plan - 04/23/20 1509    Clinical Impression Statement PT continues to progress with therex program for L hip stability and strength. Pt reports no pain or soreness in hip and is able to demonstrate HEP with minimal cues. Pt able to complete new exercises with cueing for proper form and technique, without pain. Pt  continues to have balance deficits but improved from last visit,  pt stated she has been practing at home. PT will continue to progress as able.    Personal Factors and Comorbidities Comorbidity 1;Comorbidity 2;Comorbidity 3+;Past/Current Experience;Time since onset of injury/illness/exacerbation;Fitness    Comorbidities osteopenia, scolosis, IBS, spinal fusion of multiple vertebrae in 1994, pt reports at least "all lumbar segments"    Examination-Activity Limitations Lift;Sit;Squat;Transfers    Examination-Participation Restrictions Community Activity;Driving;Cleaning    Stability/Clinical Decision Making Evolving/Moderate complexity    Clinical Decision Making Moderate    Rehab Potential Good    PT Frequency 2x / week    PT Duration 8 weeks    PT Treatment/Interventions Moist Heat;Electrical Stimulation;Stair training;Therapeutic activities;Neuromuscular re-education;Dry needling;Joint Manipulations;Spinal Manipulations;Manual techniques    PT Home Exercise Plan hamstring stretch, glute stretch, bridge    Consulted and Agree with Plan of Care Patient           Patient will benefit from skilled therapeutic intervention in order to improve the following deficits and impairments:  Abnormal gait, Decreased balance, Decreased coordination, Decreased mobility, Difficulty walking, Impaired flexibility, Postural dysfunction, Decreased activity tolerance, Decreased endurance, Decreased range of motion, Decreased strength, Increased fascial restricitons, Improper body mechanics, Pain  Visit Diagnosis: Chronic left hip pain  Stiffness of left hip, not elsewhere classified     Problem List Patient Active Problem List   Diagnosis Date Noted  . Chronic left hip pain 03/05/2020  . Mixed hyperlipidemia 09/18/2019  . Chronic kidney disease, stage 3a ([place]) 09/18/2019  . Obstructive sleep apnea 07/05/2019  . 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/2019  . Osteopenia 02/08/2019  . Open-angle glaucoma of both eyes, mild stage 02/07/2019  . Essential tremor 05/20/2016  .  Depression 12/25/2015  . BCC (basal cell carcinoma of skin) 07/29/2015  . Reflex sympathetic dystrophy of lower extremity 06/05/2014  . Migraine headache 10/10/2007  . GLAUCOMA 10/10/2007  . MITRAL VALVE PROLAPSE 10/10/2007  . Venous (peripheral) insufficiency 10/10/2007  . Diverticulosis of large intestine 10/10/2007  . IRRITABLE BOWEL SYNDROME 10/10/2007  . Disorder of bone and cartilage 10/10/2007  . SCOLIOSIS 10/10/2007  . PAC (premature atrial contraction) 10/10/2007    Durwin Reges DPT [place], SPT  Durwin Reges 04/23/2020, 5:07 PM  [facility] [address] [place], [address] Phone: [phone]   Fax:  [phone]  Name: Dana Massey MRN: [id number] Date of Birth: 24-Feb-1946

## 2020-04-25 ENCOUNTER — Other Ambulatory Visit: Payer: Self-pay

## 2020-04-25 ENCOUNTER — Ambulatory Visit: Payer: Medicare HMO | Admitting: Physical Therapy

## 2020-04-25 ENCOUNTER — Encounter: Payer: Self-pay | Admitting: Physical Therapy

## 2020-04-25 DIAGNOSIS — G8929 Other chronic pain: Secondary | ICD-10-CM

## 2020-04-25 DIAGNOSIS — M25652 Stiffness of left hip, not elsewhere classified: Secondary | ICD-10-CM

## 2020-04-25 DIAGNOSIS — M25552 Pain in left hip: Secondary | ICD-10-CM | POA: Diagnosis not present

## 2020-04-25 NOTE — Therapy (Signed)
[facility] [address] [place], [address] Phone: [phone]   Fax:  [phone]  Physical Therapy Treatment/Discharge Summary Reporting Period 04/03/20-04/25/20  Patient Details  Name: Dana Massey MRN: [id number] Date of Birth: Feb 18, 1946 No data recorded  Encounter Date: 04/25/2020   PT End of Session - 04/25/20 1832    Visit Number 7    Number of Visits 17    Date for PT Re-Evaluation 05/31/20    Authorization - Visit Number 6    Authorization - Number of Visits 10    PT Start Time 0345    PT Stop Time 0425    PT Time Calculation (min) 40 min    Activity Tolerance Patient tolerated treatment well    Behavior During Therapy [facility] for tasks assessed/performed           Past Medical History:  Diagnosis Date  . Cataract    left eye surgery to remove, right eye just watching currently  . Constipation   . Depression   . Diverticulosis of colon   . Glaucoma    ocular htn bilateral eyes - uses drops   . History of blood transfusion 1994   with spinal fusion surgery  . IBS (irritable bowel syndrome)   . Migraine headache    resolved, no current problem  . Mitral valve prolapse    patient denies this dx  . Neuromuscular disorder ([place])    essential tremor hands  . Osteopenia   . PAC (premature atrial contraction)    on metoprolol  . Palpitations   . Scoliosis   . Shoulder pain    resolved, no loonger a problem  . Toxic effect of venom(989.5)   . Venous insufficiency     Past Surgical History:  Procedure Laterality Date  . BACK SURGERY  1994   scoliosis at [facility] spine center  . CATARACT EXTRACTION Left 04/2010  . COLONOSCOPY  2009   [place]- Normal  . ROTATOR CUFF REPAIR  2006  . WISDOM TOOTH EXTRACTION      There were no vitals filed for this visit.   Subjective Assessment - 04/25/20 1549    Subjective Patient reports no pain or soreness in L hip. Pt reports overall hip is 80%  and she would like to be put on a HEP to do at home next visit.    Pertinent History Pt is a 74 year old female presenting with chronic L hip pain over the past year, worsening over the past 6 weeks with insideous onset. Patient reports pain is in the L buttock and upper post thigh, denies LBP. Pain is intermittent, coming on after she has sat for >2 hours, and is a dull ache. Denies tingling, numbess, pins and needles. Worst pain 8/10; best 0/10. She is retired, enjoys Firefighter, reading and watching TV. She joined [facility] senior fitness class (total body class) and is completing 4x/week 59mn classes. She is usually able to complete this, had difficulty with one exercise with L hip abd. Lives with husband with 4 steps to enter, in a 2 story home (able to live on 1st floor), reports no difficulty with this, as long as she has rail. Drives and completes community errands. Pt denies N/V, B&B changes, unexplained weight fluctuation, saddle paresthesia, fever, night sweats, or unrelenting night pain at this time.    Limitations House hold activities;Sitting    How long can you sit comfortably? 2 hours    How long  can you stand comfortably? unlimited    How long can you walk comfortably? unlimited    Diagnostic tests none    Patient Stated Goals get rid of the pain    Currently in Pain? No/denies    Pain Score 0-No pain    Pain Onset 1 to 4 weeks ago           Therex   -Nustep L3 UE and seat setting 6   -Sidelying hip abd 3# AW 3x 10 with better carry over of neutral lift without hip flex  -single leg bridges 3x10   -piriformis stretch  -supine hip flexion w/RTB and eccentric control 2x10  -prone hip extension 2x10  -Matrix hip abd 25# on RLE, 40# w/LLE 3x10, mild difficulty with   -5TSTS  HEP PT reviewed the following HEP with patient with patient able to demonstrate a set of the following with min cuing for correction needed. PT educated patient on parameters of therex (how/when to  inc/decrease intensity, frequency, rep/set range, stretch hold time, and purpose of therex) with verbalized understanding.  -1x8 split squat -lateral banded walks RTB 2x58f -single leg bridges 3x10  Piriformis and hamstring stretch                         PT Education - 04/26/20 0913    Education Details d/c recommendations    Person(s) Educated Patient    Methods Explanation;Demonstration;Verbal cues;Handout    Comprehension Verbalized understanding;Returned demonstration;Verbal cues required            PT Short Term Goals - 04/25/20 1622      PT SHORT TERM GOAL #1   Title Pt will be independent with HEP in order to improve strength and decrease back pain in order to improve pain-free function at home and work.    Baseline 04/03/20 HEP given 04/25/20 Updated HEP given    Time 4    Period Weeks    Status Achieved             PT Long Term Goals - 04/25/20 1624      PT LONG TERM GOAL #1   Title Pt will decrease worst back pain as reported on NPRS by at least 2 points in order to demonstrate clinically significant reduction in back pain.    Baseline 04/03/20 8/10 04/25/20 0/10    Time 8    Period Weeks    Status Achieved      PT LONG TERM GOAL #2   Title Pt will perform 5xSTS in 10sec or less to demonstrate clinically significant improvement in BLE strength    Baseline 04/03/20 12sec 04/25/20 8.9sec    Time 8    Period Weeks    Status Achieved      PT LONG TERM GOAL #3   Title Patient will increase FOTO score to 76 to demonstrate predicted increase in functional mobility to complete ADLs    Baseline 04/03/20 61 04/25/20 85    Time 8    Period Weeks    Status Achieved                 Plan - 04/25/20 1630    Clinical Impression Statement Pt continues to have no pain in L hip. Pt able to show good progression with BLE strength and balance. Pt able to demonstrate and verbalize new HEP program with minimal cues. Pt has met all stated goals.  D/C from PT.    Personal Factors and  Comorbidities Comorbidity 1;Comorbidity 2;Comorbidity 3+;Past/Current Experience;Time since onset of injury/illness/exacerbation;Fitness    Comorbidities osteopenia, scolosis, IBS, spinal fusion of multiple vertebrae in 1994, pt reports at least "all lumbar segments"    Examination-Activity Limitations Lift;Sit;Squat;Transfers    Examination-Participation Restrictions Community Activity;Driving;Cleaning    Stability/Clinical Decision Making Evolving/Moderate complexity    Clinical Decision Making Moderate    Rehab Potential Good    PT Frequency 2x / week    PT Duration 8 weeks    PT Treatment/Interventions Moist Heat;Electrical Stimulation;Stair training;Therapeutic activities;Neuromuscular re-education;Dry needling;Joint Manipulations;Spinal Manipulations;Manual techniques    PT Home Exercise Plan split squat, lateral banded walks, single leg bridge    Consulted and Agree with Plan of Care Patient           Patient will benefit from skilled therapeutic intervention in order to improve the following deficits and impairments:  Abnormal gait, Decreased balance, Decreased coordination, Decreased mobility, Difficulty walking, Impaired flexibility, Postural dysfunction, Decreased activity tolerance, Decreased endurance, Decreased range of motion, Decreased strength, Increased fascial restricitons, Improper body mechanics, Pain  Visit Diagnosis: Chronic left hip pain  Stiffness of left hip, not elsewhere classified     Problem List Patient Active Problem List   Diagnosis Date Noted  . Chronic left hip pain 03/05/2020  . Mixed hyperlipidemia 09/18/2019  . Chronic kidney disease, stage 3a ([place]) 09/18/2019  . Obstructive sleep apnea 07/05/2019  . 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/2019  . Osteopenia 02/08/2019  . Open-angle glaucoma of both eyes, mild stage 02/07/2019  . Essential tremor 05/20/2016  . Depression 12/25/2015  . BCC (basal cell carcinoma  of skin) 07/29/2015  . Reflex sympathetic dystrophy of lower extremity 06/05/2014  . Migraine headache 10/10/2007  . GLAUCOMA 10/10/2007  . MITRAL VALVE PROLAPSE 10/10/2007  . Venous (peripheral) insufficiency 10/10/2007  . Diverticulosis of large intestine 10/10/2007  . IRRITABLE BOWEL SYNDROME 10/10/2007  . Disorder of bone and cartilage 10/10/2007  . SCOLIOSIS 10/10/2007  . PAC (premature atrial contraction) 10/10/2007    Durwin Reges DPT [place], SPT Durwin Reges 04/26/2020, 9:16 AM  [facility] [address], [place], [address] Phone: [phone]   Fax:  [phone]  Name: Dana Massey MRN: [id number] Date of Birth: 1945/12/07

## 2020-04-26 ENCOUNTER — Encounter: Payer: Self-pay | Admitting: Physical Therapy

## 2020-04-30 ENCOUNTER — Encounter: Payer: Medicare HMO | Admitting: Physical Therapy

## 2020-05-02 ENCOUNTER — Encounter: Payer: Medicare HMO | Admitting: Physical Therapy

## 2020-05-07 ENCOUNTER — Encounter: Payer: Medicare HMO | Admitting: Physical Therapy

## 2020-05-14 ENCOUNTER — Encounter: Payer: Medicare HMO | Admitting: Physical Therapy

## 2020-07-10 ENCOUNTER — Other Ambulatory Visit: Payer: Self-pay | Admitting: *Deleted

## 2020-07-10 DIAGNOSIS — E782 Mixed hyperlipidemia: Secondary | ICD-10-CM

## 2020-07-10 MED ORDER — ATORVASTATIN CALCIUM 10 MG PO TABS: 10.0000 mg | ORAL_TABLET | Freq: Every day | ORAL | 1 refills | Status: DC

## 2020-09-02 DIAGNOSIS — E519 Thiamine deficiency, unspecified: Secondary | ICD-10-CM | POA: Diagnosis not present

## 2020-09-02 DIAGNOSIS — E559 Vitamin D deficiency, unspecified: Secondary | ICD-10-CM | POA: Diagnosis not present

## 2020-09-02 DIAGNOSIS — R202 Paresthesia of skin: Secondary | ICD-10-CM | POA: Diagnosis not present

## 2020-09-02 DIAGNOSIS — R7309 Other abnormal glucose: Secondary | ICD-10-CM | POA: Diagnosis not present

## 2020-09-02 DIAGNOSIS — M47818 Spondylosis without myelopathy or radiculopathy, sacral and sacrococcygeal region: Secondary | ICD-10-CM | POA: Diagnosis not present

## 2020-09-02 DIAGNOSIS — E538 Deficiency of other specified B group vitamins: Secondary | ICD-10-CM | POA: Diagnosis not present

## 2020-09-02 DIAGNOSIS — E531 Pyridoxine deficiency: Secondary | ICD-10-CM | POA: Diagnosis not present

## 2020-09-02 DIAGNOSIS — Z981 Arthrodesis status: Secondary | ICD-10-CM | POA: Diagnosis not present

## 2020-09-02 DIAGNOSIS — M533 Sacrococcygeal disorders, not elsewhere classified: Secondary | ICD-10-CM | POA: Diagnosis not present

## 2020-09-18 ENCOUNTER — Other Ambulatory Visit: Payer: Self-pay | Admitting: Neurology

## 2020-09-18 DIAGNOSIS — M533 Sacrococcygeal disorders, not elsewhere classified: Secondary | ICD-10-CM

## 2020-09-30 ENCOUNTER — Ambulatory Visit
Admission: RE | Admit: 2020-09-30 | Discharge: 2020-09-30 | Disposition: A | Discharge status: Home / Self Care | Payer: Medicare HMO | Source: Ambulatory Visit | Attending: Neurology | Admitting: Neurology

## 2020-09-30 ENCOUNTER — Other Ambulatory Visit: Payer: Self-pay

## 2020-09-30 DIAGNOSIS — M533 Sacrococcygeal disorders, not elsewhere classified: Secondary | ICD-10-CM | POA: Insufficient documentation

## 2020-09-30 DIAGNOSIS — M545 Low back pain, unspecified: Secondary | ICD-10-CM | POA: Diagnosis not present

## 2020-10-14 DIAGNOSIS — R202 Paresthesia of skin: Secondary | ICD-10-CM | POA: Diagnosis not present

## 2020-10-17 DIAGNOSIS — R202 Paresthesia of skin: Secondary | ICD-10-CM | POA: Diagnosis not present

## 2020-10-17 DIAGNOSIS — M533 Sacrococcygeal disorders, not elsewhere classified: Secondary | ICD-10-CM | POA: Diagnosis not present

## 2020-10-29 ENCOUNTER — Ambulatory Visit: Payer: Medicare HMO | Attending: Internal Medicine

## 2020-10-29 ENCOUNTER — Other Ambulatory Visit: Payer: Self-pay

## 2020-10-29 DIAGNOSIS — Z23 Encounter for immunization: Secondary | ICD-10-CM

## 2020-10-29 MED ORDER — PFIZER-BIONT COVID-19 VAC-TRIS 30 MCG/0.3ML IM SUSP
INTRAMUSCULAR | 0 refills | Status: DC
Start: 2020-10-29 — End: 2021-05-15
  Filled 2020-10-29: qty 0.3, 1d supply, fill #0

## 2020-10-29 NOTE — Progress Notes (Signed)
   Covid-19 Vaccination Clinic  Name:  Dana Massey    MRN: [id number] DOB: [birth year]  10/29/2020  Ms. Toomey was observed post Covid-19 immunization for 15 minutes without incident. She was provided with Vaccine Information Sheet and instruction to access the V-Safe system.   Ms. Rhue was instructed to call 911 with any severe reactions post vaccine: [name] Difficulty breathing  . Swelling of face and throat  . A fast heartbeat  . A bad rash all over body  . Dizziness and weakness   Immunizations Administered    Name Date Dose VIS Date Route   PFIZER Comrnaty(Gray TOP) Covid-19 Vaccine 10/29/2020  9:28 AM 0.3 mL 05/23/2020 Intramuscular   Manufacturer: [employer]   Lot: [id number]   NDC: [place], PharmD, MBA Clinical Acute Care Pharmacist

## 2020-11-13 DIAGNOSIS — S92354A Nondisplaced fracture of fifth metatarsal bone, right foot, initial encounter for closed fracture: Secondary | ICD-10-CM | POA: Diagnosis not present

## 2020-11-25 DIAGNOSIS — S92354A Nondisplaced fracture of fifth metatarsal bone, right foot, initial encounter for closed fracture: Secondary | ICD-10-CM | POA: Diagnosis not present

## 2020-12-04 ENCOUNTER — Other Ambulatory Visit: Payer: Self-pay | Admitting: Family Medicine

## 2020-12-04 DIAGNOSIS — E782 Mixed hyperlipidemia: Secondary | ICD-10-CM

## 2020-12-09 DIAGNOSIS — Z1231 Encounter for screening mammogram for malignant neoplasm of breast: Secondary | ICD-10-CM | POA: Diagnosis not present

## 2020-12-09 LAB — HM MAMMOGRAPHY

## 2020-12-12 DIAGNOSIS — I788 Other diseases of capillaries: Secondary | ICD-10-CM | POA: Diagnosis not present

## 2020-12-12 DIAGNOSIS — D1801 Hemangioma of skin and subcutaneous tissue: Secondary | ICD-10-CM | POA: Diagnosis not present

## 2020-12-12 DIAGNOSIS — L905 Scar conditions and fibrosis of skin: Secondary | ICD-10-CM | POA: Diagnosis not present

## 2020-12-12 DIAGNOSIS — L814 Other melanin hyperpigmentation: Secondary | ICD-10-CM | POA: Diagnosis not present

## 2020-12-12 DIAGNOSIS — Z872 Personal history of diseases of the skin and subcutaneous tissue: Secondary | ICD-10-CM | POA: Diagnosis not present

## 2020-12-12 DIAGNOSIS — D225 Melanocytic nevi of trunk: Secondary | ICD-10-CM | POA: Diagnosis not present

## 2020-12-12 DIAGNOSIS — L821 Other seborrheic keratosis: Secondary | ICD-10-CM | POA: Diagnosis not present

## 2020-12-12 DIAGNOSIS — Z85828 Personal history of other malignant neoplasm of skin: Secondary | ICD-10-CM | POA: Diagnosis not present

## 2020-12-25 ENCOUNTER — Encounter: Payer: Self-pay | Admitting: Family Medicine

## 2021-01-06 DIAGNOSIS — S92354A Nondisplaced fracture of fifth metatarsal bone, right foot, initial encounter for closed fracture: Secondary | ICD-10-CM | POA: Diagnosis not present

## 2021-01-16 ENCOUNTER — Other Ambulatory Visit: Payer: Self-pay | Admitting: Internal Medicine

## 2021-01-23 ENCOUNTER — Other Ambulatory Visit: Payer: Self-pay | Admitting: Family Medicine

## 2021-01-23 DIAGNOSIS — F325 Major depressive disorder, single episode, in full remission: Secondary | ICD-10-CM

## 2021-02-20 DIAGNOSIS — H5213 Myopia, bilateral: Secondary | ICD-10-CM | POA: Diagnosis not present

## 2021-03-12 ENCOUNTER — Ambulatory Visit (INDEPENDENT_AMBULATORY_CARE_PROVIDER_SITE_OTHER): Payer: Medicare HMO | Admitting: Family Medicine

## 2021-03-12 ENCOUNTER — Encounter: Payer: Self-pay | Admitting: Family Medicine

## 2021-03-12 ENCOUNTER — Other Ambulatory Visit: Payer: Self-pay

## 2021-03-12 VITALS — BP 118/80 | HR 60 | Temp 97.3°F | Ht 64.0 in | Wt 137.5 lb

## 2021-03-12 DIAGNOSIS — N1831 Chronic kidney disease, stage 3a: Secondary | ICD-10-CM | POA: Diagnosis not present

## 2021-03-12 DIAGNOSIS — E782 Mixed hyperlipidemia: Secondary | ICD-10-CM

## 2021-03-12 DIAGNOSIS — G6289 Other specified polyneuropathies: Secondary | ICD-10-CM | POA: Diagnosis not present

## 2021-03-12 DIAGNOSIS — Z23 Encounter for immunization: Secondary | ICD-10-CM

## 2021-03-12 DIAGNOSIS — G588 Other specified mononeuropathies: Secondary | ICD-10-CM | POA: Insufficient documentation

## 2021-03-12 DIAGNOSIS — F325 Major depressive disorder, single episode, in full remission: Secondary | ICD-10-CM | POA: Diagnosis not present

## 2021-03-12 DIAGNOSIS — Z0000 Encounter for general adult medical examination without abnormal findings: Secondary | ICD-10-CM

## 2021-03-12 DIAGNOSIS — G629 Polyneuropathy, unspecified: Secondary | ICD-10-CM | POA: Insufficient documentation

## 2021-03-12 NOTE — Patient Instructions (Addendum)
Consider getting Shingles vaccine - check with pharmacy  Call next year before you get your mammogram to get Bone Density

## 2021-03-12 NOTE — Assessment & Plan Note (Signed)
Stable on zoloft 50 mg. Cont

## 2021-03-12 NOTE — Addendum Note (Signed)
Addended by: Tammi Sou on: 03/12/2021 02:57 PM   Modules accepted: Orders

## 2021-03-12 NOTE — Progress Notes (Signed)
Subjective:   Dana Massey is a 75 y.o. female who presents for Medicare Annual (Subsequent) preventive examination.  Review of Systems    Review of Systems  Constitutional:  Negative for chills and fever.  HENT:  Negative for congestion and sore throat.   Eyes:  Negative for blurred vision and double vision.  Respiratory:  Negative for shortness of breath.   Cardiovascular:  Negative for chest pain.  Gastrointestinal:  Negative for heartburn, nausea and vomiting.  Genitourinary: Negative.   Musculoskeletal: Negative.  Negative for myalgias.  Skin:  Negative for rash.  Neurological:  Negative for dizziness and headaches.  Endo/Heme/Allergies:  Does not bruise/bleed easily.  Psychiatric/Behavioral:  Negative for depression. The patient is not nervous/anxious.    Cardiac Risk Factors include: advanced age (>72men, >59 women);dyslipidemia     Objective:    Today's Vitals   03/12/21 1000  BP: 118/80  Pulse: 60  Temp: (!) 97.3 F (36.3 C)  TempSrc: Temporal  SpO2: 100%  Weight: 137 lb 8 oz (62.4 kg)  Height: 5\' 4"  (1.626 m)   Body mass index is 23.6 kg/m.  Advanced Directives 03/12/2021 04/03/2020 03/05/2020 01/01/2020 07/05/2019 04/03/2019 02/08/2019  Does Patient Have a Medical Advance Directive? Yes No Yes Yes Yes Yes Yes  Type of Advance Directive [place];Living will Living will;Healthcare Power of Attorney - -  Does patient want to make changes to medical advance directive? No - Patient declined - - - - - No - Patient declined  Copy of [place] in Chart? Yes - validated most recent copy scanned in chart (See row information) - - - - - -  Would patient like information on creating a medical advance directive? - No - Patient declined - No - Patient declined - - -    Current Medications (verified) Outpatient Encounter Medications as of 03/12/2021  Medication Sig   Acetaminophen 500 MG coapsule     aspirin EC 81 MG tablet Take 81 mg by mouth daily.   atorvastatin (LIPITOR) 10 MG tablet TAKE 1 TABLET EVERY DAY   Calcium-Vitamin D-Vitamin K 500-100-40 MG-UNT-MCG CHEW Chew 1 capsule by mouth daily.     COVID-19 mRNA Vac-TriS, Pfizer, (PFIZER-BIONT COVID-19 VAC-TRIS) SUSP injection Inject into the muscle.   cycloSPORINE (RESTASIS) 0.05 % ophthalmic emulsion Place 1 drop into both eyes 2 (two) times daily.     dorzolamide (TRUSOPT) 2 % ophthalmic solution 1 drop 2 (two) times daily.   fish oil-omega-3 fatty acids 1000 MG capsule Take 2 g by mouth daily.     gabapentin (NEURONTIN) 300 MG capsule Take 300 mg by mouth daily at 12 noon.   latanoprost (XALATAN) 0.005 % ophthalmic solution Place 1 drop into both eyes at bedtime.   MELATONIN PO Take 6 mg by mouth at bedtime as needed.    metoprolol succinate (TOPROL-XL) 25 MG 24 hr tablet TAKE 1 TABLET DAILY WITH OR IMMEDIATELY FOLLOWING A MEAL.   Multiple Vitamins-Minerals (MULTIVITAMIN WITH MINERALS) tablet Take 1 tablet by mouth daily.     PFIZER-BIONTECH COVID-19 VACC 30 MCG/0.3ML injection USE AS DIRECTED   polyethylene glycol powder (GLYCOLAX/MIRALAX) 17 GM/SCOOP powder Take 17 g by mouth daily as needed.    sertraline (ZOLOFT) 50 MG tablet TAKE 1 AND 1/2 TABLETS EVERY DAY   triamcinolone lotion (KENALOG) 0.1 %    No facility-administered encounter medications on file as of 03/12/2021.    Allergies (verified) Wasp venom   History:  Past Medical History:  Diagnosis Date   Cataract    left eye surgery to remove, right eye just watching currently   Constipation    Depression    Diverticulosis of colon    Glaucoma    ocular htn bilateral eyes - uses drops    History of blood transfusion 1994   with spinal fusion surgery   IBS (irritable bowel syndrome)    Migraine headache    resolved, no current problem   Mitral valve prolapse    patient denies this dx   Neuromuscular disorder ([place])    essential tremor hands   Osteopenia     PAC (premature atrial contraction)    on metoprolol   Palpitations    Scoliosis    Shoulder pain    resolved, no loonger a problem   Toxic effect of venom(989.5)    Venous insufficiency    Past Surgical History:  Procedure Laterality Date   BACK SURGERY  1994   scoliosis at [facility] spine center   CATARACT EXTRACTION Left 04/2010   COLONOSCOPY  2009   [place]- Normal   ROTATOR CUFF REPAIR  2006   WISDOM TOOTH EXTRACTION     Family History  Problem Relation Age of Onset   Diabetes Mother    Stroke Paternal Grandmother 47   Colon cancer Neg Hx    Rectal cancer Neg Hx    Stomach cancer Neg Hx    Esophageal cancer Neg Hx    Social History   Socioeconomic History   Marital status: Married    Spouse name: [name]   Number of children: 2   Years of education: associate degree   Highest education level: Not on file  Occupational History   Not on file  Tobacco Use   Smoking status: Never   Smokeless tobacco: Never  Vaping Use   Vaping Use: Never used  Substance and Sexual Activity   Alcohol use: Yes    Comment: rarely - holiday   Drug use: No   Sexual activity: Yes    Partners: Male    Birth control/protection: Post-menopausal    Comment: husband vasectomy  Other Topics Concern   Not on file  Social History Narrative   Does not have a living will.   Desires CPR but would not want prolonged life support.   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       Family: 2 children - Rodman Key and Hydrologist - both [place], 3 grandchildren (age 1 to 55)      Enjoys: reading, knitting, exercise, watch TV, watch husband softball      Exercise: 4 times a week - 45 minute senior fitness class   Diet: tries to eat right - fish a few times a week, no beef, not as many veggies as she should      Safety   Seat belts: Yes    Guns: Yes  and secure   Safe  in relationships: Yes          Social Determinants of Health   Financial Resource Strain: Not on file  Food Insecurity: Not on file  Transportation Needs: Not on file  Physical Activity: Not on file  Stress: Not on file  Social Connections: Not on file    Tobacco Counseling Counseling given: Not Answered   Clinical Intake:  Pre-visit preparation  completed: No  Pain : No/denies pain     BMI - recorded: 23.6 Nutritional Status: BMI of 19-24  Normal Nutritional Risks: None Diabetes: No  How often do you need to have someone help you when you read instructions, pamphlets, or other written materials from your doctor or pharmacy?: 1 - Never What is the last grade level you completed in school?: Associates degree  Diabetic?no  Interpreter Needed?: No      Activities of Daily Living In your present state of health, do you have any difficulty performing the following activities: 03/12/2021  Hearing? N  Vision? N  Difficulty concentrating or making decisions? N  Walking or climbing stairs? N  Dressing or bathing? N  Doing errands, shopping? N  Preparing Food and eating ? N  Using the Toilet? N  In the past six months, have you accidently leaked urine? N  Do you have problems with loss of bowel control? N  Managing your Medications? N  Managing your Finances? N  Housekeeping or managing your Housekeeping? N  Some recent data might be hidden    Patient Care Team: Lesleigh Noe, MD as PCP - General (Family Medicine) Evans Lance, MD as Consulting Physician (Cardiology) Macarthur Critchley, [place] as Referring Physician (Optometry) Glennie Isle, PA-C as Physician Assistant (Dermatology) Vladimir Crofts, MD as Consulting Physician (Neurology)  Indicate any recent Medical Services you may have received from other than [facility] providers in the past year (date may be approximate).     Assessment:   This is a routine wellness examination for [place].  Hearing/Vision  screen Hearing Screening   250Hz  500Hz  1000Hz  2000Hz  4000Hz   Right ear 20 20 25 25  0  Left ear 20 20 20 20  0  Vision Screening - Comments:: Last eye exam September 2022 at [facility] [facility] issues and exercise activities discussed: Current Exercise Habits: Structured exercise class, Type of exercise: strength training/weights, Time (Minutes): 45, Frequency (Times/Week): 4, Weekly Exercise (Minutes/Week): 180, Intensity: Moderate, Exercise limited by: orthopedic condition(s)   Goals Addressed             This Visit's Progress    Increase physical activity       Hoping to increase from 4 to 5 days a week      Depression Screen PHQ 2/9 Scores 03/12/2021 03/05/2020 03/05/2020 02/08/2019 01/19/2018 01/05/2017 12/25/2015  PHQ - 2 Score 0 0 0 0 0 0 6  PHQ- 9 Score 0 - - - - - 16    Fall Risk Fall Risk  03/12/2021 03/05/2020 03/05/2020 02/08/2019 01/19/2018  Falls in the past year? 1 0 0 0 No  Number falls in past yr: 0 0 0 - -  Injury with Fall? 1 0 - - -  Comment Broke bone in foot - - - -  Risk for fall due to : - No Fall Risks - - -  Follow up - Falls evaluation completed - - -    FALL RISK PREVENTION PERTAINING TO THE HOME:  Any stairs in or around the home? Yes  If so, are there any without handrails? Yes  Home free of loose throw rugs in walkways, pet beds, electrical cords, etc? Yes  Adequate lighting in your home to reduce risk of falls? Yes   ASSISTIVE DEVICES UTILIZED TO PREVENT FALLS:  Life alert? No  Use of a cane, walker or w/c? No  Grab bars in the bathroom? Yes  Shower chair or bench in shower? Yes  Elevated toilet seat or a handicapped toilet? Yes    Cognitive Function: MMSE - Mini Mental State Exam 01/05/2017 12/25/2015  Orientation to time 5 5  Orientation to Place 5 5  Registration 3 3  Attention/ Calculation 0 0  Recall 3 3  Language- name 2 objects 0 0  Language- repeat 1 1  Language- follow 3 step command 3 3  Language- read & follow  direction 0 0  Write a sentence 0 0  Copy design 0 0  Total score 20 20     6CIT Screen 03/05/2020 02/08/2019  What Year? 0 points 0 points  What month? 0 points 0 points  What time? 0 points 0 points  Count back from 20 0 points 0 points  Months in reverse 0 points 0 points  Repeat phrase 2 points 0 points  Total Score 2 0    Mini-Cog - 03/12/21 1020     Normal clock drawing test? yes    How many words correct? 3              Immunizations Immunization History  Administered Date(s) Administered   Fluad Quad(high Dose 65+) 02/08/2019, 03/05/2020, 03/12/2021   Influenza Split 05/26/2011, 03/22/2012   Influenza Whole 03/07/2008, 03/29/2009, 03/14/2010   Influenza,inj,Quad PF,6+ Mos 03/21/2013, 02/21/2014, 03/28/2015, 04/15/2016, 03/19/2017   Influenza,trivalent, recombinat, inj, PF 03/11/2018   PFIZER Comirnaty(Gray [place])Covid-19 Tri-Sucrose Vaccine 10/29/2020   PFIZER(Purple Top)SARS-COV-2 Vaccination 07/28/2019, 08/23/2019, 04/19/2020   Pneumococcal Conjugate-13 12/18/2013   Pneumococcal Polysaccharide-23 11/20/2011   Td 12/24/2014   Zoster, Live 12/18/2013    TDAP status: Up to date  Flu Vaccine status: Completed at today's visit  Pneumococcal vaccine status: Up to date  Covid-19 vaccine status: Completed vaccines  Qualifies for Shingles Vaccine? Yes   Zostavax completed Yes   Shingrix Completed?: No.    Education has been provided regarding the importance of this vaccine. Patient has been advised to call insurance company to determine out of pocket expense if they have not yet received this vaccine. Advised may also receive vaccine at local pharmacy or Health Dept. Verbalized acceptance and understanding.  Screening Tests Health Maintenance  Topic Date Due   Zoster Vaccines- Shingrix (1 of 2) Never done   COVID-19 Vaccine (5 - Booster for Pfizer series) 03/01/2021   MAMMOGRAM  12/09/2021   DEXA SCAN  02/13/2022   COLONOSCOPY (Pts 45-70yrs Insurance  coverage will need to be confirmed)  01/05/2023   TETANUS/TDAP  12/23/2024   INFLUENZA VACCINE  Completed   Hepatitis C Screening  Completed   HPV VACCINES  Aged Out    Health Maintenance  Health Maintenance Due  Topic Date Due   Zoster Vaccines- Shingrix (1 of 2) Never done   COVID-19 Vaccine (5 - Booster for Pfizer series) 03/01/2021    Colorectal cancer screening: Type of screening: Colonoscopy. Completed 2019. Repeat every 5 years  Mammogram status: Completed 11/2020. Repeat every year  Bone Density status: Completed 2021. Results reflect: Bone density results: OSTEOPENIA. Repeat every 2 years.  Lung Cancer Screening: (Low Dose CT Chest recommended if Age 83-80 years, 30 pack-year currently smoking OR have quit w/in 15years.) does not qualify.   Lung Cancer Screening Referral: n/a  Additional Screening:  Hepatitis C Screening: does qualify; Completed 2017  Vision Screening: Recommended annual ophthalmology exams for early detection of glaucoma and other disorders of the eye. Is the patient up to date with their annual eye exam?  Yes  Who is the provider or what is the  name of the office in which the patient attends annual eye exams? [facility] If pt is not established with a provider, would they like to be referred to a provider to establish care?  N/a .   Dental Screening: Recommended annual dental exams for proper oral hygiene  Community Resource Referral / Chronic Care Management: CRR required this visit?  No   CCM required this visit?  No      Plan:     Problem List Items Addressed This Visit       Nervous and Auditory   Peripheral neuropathy   Relevant Medications   gabapentin (NEURONTIN) 300 MG capsule     Genitourinary   Chronic kidney disease, stage 3a (HCC)   Relevant Orders   Comprehensive metabolic panel     Other   Depression    Stable on zoloft 50 mg. Cont       Mixed hyperlipidemia   Relevant Orders   Lipid panel   Other Visit  Diagnoses     Encounter for Medicare annual wellness exam    -  Primary   Need for influenza vaccination       Relevant Orders   Flu Vaccine QUAD High Dose(Fluad) (Completed)        I have personally reviewed and noted the following in the patient's chart:   Medical and social history Use of alcohol, tobacco or illicit drugs  Current medications and supplements including opioid prescriptions.  Functional ability and status Nutritional status Physical activity Advanced directives List of other physicians Hospitalizations, surgeries, and ER visits in previous 12 months Vitals Screenings to include cognitive, depression, and falls Referrals and appointments  In addition, I have reviewed and discussed with patient certain preventive protocols, quality metrics, and best practice recommendations. A written personalized care plan for preventive services as well as general preventive health recommendations were provided to patient.     Lesleigh Noe, MD   03/12/2021

## 2021-03-13 ENCOUNTER — Other Ambulatory Visit (INDEPENDENT_AMBULATORY_CARE_PROVIDER_SITE_OTHER): Payer: Medicare HMO

## 2021-03-13 DIAGNOSIS — E782 Mixed hyperlipidemia: Secondary | ICD-10-CM | POA: Diagnosis not present

## 2021-03-13 DIAGNOSIS — N1831 Chronic kidney disease, stage 3a: Secondary | ICD-10-CM | POA: Diagnosis not present

## 2021-03-13 LAB — LIPID PANEL
Cholesterol: 140 mg/dL (ref 0–200)
HDL: 54.2 mg/dL (ref >39.00)
LDL Cholesterol: 70 mg/dL (ref 0–99)
NonHDL: 86.19
Total CHOL/HDL Ratio: 3
Triglycerides: 80 mg/dL (ref 0.0–149.0)
VLDL: 16 mg/dL (ref 0.0–40.0)

## 2021-03-13 LAB — COMPREHENSIVE METABOLIC PANEL
ALT: 14 U/L (ref 0–35)
AST: 20 U/L (ref 0–37)
Albumin: 4.1 g/dL (ref 3.5–5.2)
Alkaline Phosphatase: 60 U/L (ref 39–117)
BUN: 17 mg/dL (ref 6–23)
CO2: 33 mEq/L — ABNORMAL HIGH (ref 19–32)
Calcium: 9.8 mg/dL (ref 8.4–10.5)
Chloride: 99 mEq/L (ref 96–112)
Creatinine, Ser: 1.03 mg/dL (ref 0.40–1.20)
GFR: 53.57 mL/min — ABNORMAL LOW (ref >60.00)
Glucose, Bld: 89 mg/dL (ref 70–99)
Potassium: 4.7 mEq/L (ref 3.5–5.1)
Sodium: 138 mEq/L (ref 135–145)
Total Bilirubin: 0.6 mg/dL (ref 0.2–1.2)
Total Protein: 6.9 g/dL (ref 6.0–8.3)

## 2021-03-25 ENCOUNTER — Ambulatory Visit: Payer: Medicare HMO | Attending: Internal Medicine

## 2021-03-25 ENCOUNTER — Other Ambulatory Visit: Payer: Self-pay

## 2021-03-25 DIAGNOSIS — Z23 Encounter for immunization: Secondary | ICD-10-CM

## 2021-03-25 MED ORDER — PFIZER COVID-19 VAC BIVALENT 30 MCG/0.3ML IM SUSP
INTRAMUSCULAR | 0 refills | Status: DC
  Filled 2021-03-25: qty 0.3, 1d supply, fill #0

## 2021-03-25 NOTE — Progress Notes (Signed)
   Covid-19 Vaccination Clinic  Name:  ZARRA GEFFERT    MRN: [id number] DOB: January 15, 1946  03/25/2021  Ms. Pore was observed post Covid-19 immunization for 15 minutes without incident. She was provided with Vaccine Information Sheet and instruction to access the V-Safe system.   Ms. Shreeve was instructed to call 911 with any severe reactions post vaccine: Difficulty breathing  Swelling of face and throat  A fast heartbeat  A bad rash all over body  Dizziness and weakness   Lu Duffel, PharmD, MBA Clinical Acute Care Pharmacist

## 2021-04-01 ENCOUNTER — Other Ambulatory Visit: Payer: Self-pay | Admitting: Family Medicine

## 2021-04-01 DIAGNOSIS — F325 Major depressive disorder, single episode, in full remission: Secondary | ICD-10-CM

## 2021-04-28 ENCOUNTER — Encounter: Payer: Self-pay | Admitting: Family Medicine

## 2021-04-28 NOTE — Telephone Encounter (Signed)
Will see her at her appointment

## 2021-04-28 NOTE — Telephone Encounter (Signed)
Spoke to patient by telephone and was advised that she has a virtual visit already scheduled tomorrow 04/29/21. Patient has an appointment scheduled with Romilda Garret NP at 8:00 am. Patient was advised that she should do a covid test. Patient stated that she does have a home test and will go ahead and do it before her virtual visit tomorrow.Patient denies any SOB or difficulty breathing. Patient was given ER precautions and she verbalized understanding.

## 2021-04-29 ENCOUNTER — Other Ambulatory Visit: Payer: Self-pay

## 2021-04-29 ENCOUNTER — Telehealth (INDEPENDENT_AMBULATORY_CARE_PROVIDER_SITE_OTHER): Payer: Medicare HMO | Admitting: Nurse Practitioner

## 2021-04-29 ENCOUNTER — Encounter: Payer: Self-pay | Admitting: Nurse Practitioner

## 2021-04-29 VITALS — BP 117/78 | HR 72 | Temp 97.0°F

## 2021-04-29 DIAGNOSIS — J069 Acute upper respiratory infection, unspecified: Secondary | ICD-10-CM

## 2021-04-29 MED ORDER — AZITHROMYCIN 250 MG PO TABS: ORAL_TABLET | ORAL | 0 refills | Status: AC

## 2021-04-29 MED ORDER — GUAIFENESIN ER 600 MG PO TB12: 600.0000 mg | ORAL_TABLET | Freq: Two times a day (BID) | ORAL | 0 refills | Status: AC

## 2021-04-29 NOTE — Assessment & Plan Note (Signed)
Patient and spouse were both exposed to her sister who developed a bronchitis has been had to be treated and retreated for his illness she is having same and similar symptoms we will go ahead and treat her with azithromycin pack.  Did discuss signs and symptoms as when to seek urgent or emergent health care.  Also reinforced drinking plenty of fluids with guaifenesin in order to thin secretions of the chest.  Continue to monitor. Start azithromycin 250 mg as directed and guaifenesin 600 mg twice daily for 7 days.

## 2021-04-29 NOTE — Progress Notes (Signed)
Patient ID: Dana Massey, female    DOB: 06-07-1946, 75 y.o.   MRN: [id number]  Virtual visit completed through [employer], a video enabled telemedicine application. Due to national recommendations of social distancing due to COVID-19, a virtual visit is felt to be most appropriate for this patient at this time. Reviewed limitations, risks, security and privacy concerns of performing a virtual visit and the availability of in person appointments. I also reviewed that there may be a patient responsible charge related to this service. The patient agreed to proceed.   Patient location: home Provider location: [place] [facility], office Persons participating in this virtual visit: patient, provider   If any vitals were documented, they were collected by patient at home unless specified below.    BP 117/78   Pulse 72   Temp (!) 97 F (36.1 C)   LMP 09/13/2000   SpO2 94%    CC: Productive cough Subjective:   HPI: Dana Massey is a 75 y.o. female presenting on 04/29/2021 for Cough (Sx started on 11/11 or 11/12-productive-dark yellow color, runny nose, sore throat, headache when coughs. No SOB, fever, chest tightness, body aches. Covid test negative at home on 04/28/21)  Symptoms Last Thursday or Friday  At home covid test negative on 04/28/2021 Pfizer x2 and 3 booster Sister with a sick contact. Told that she had bronchitis  Has been taking nyquill. Does help with the cough Symptoms are staying the same.     Relevant past medical, surgical, family and social history reviewed and updated as indicated. Interim medical history since our last visit reviewed. Allergies and medications reviewed and updated. Outpatient Medications Prior to Visit  Medication Sig Dispense Refill   Acetaminophen 500 MG coapsule      aspirin EC 81 MG tablet Take 81 mg by mouth daily.     atorvastatin (LIPITOR) 10 MG tablet TAKE 1 TABLET EVERY DAY 90 tablet 0   Calcium-Vitamin D-Vitamin K  500-100-40 MG-UNT-MCG CHEW Chew 1 capsule by mouth daily.       COVID-19 mRNA bivalent vaccine, Pfizer, (PFIZER COVID-19 VAC BIVALENT) injection Inject into the muscle. 0.3 mL 0   COVID-19 mRNA Vac-TriS, Pfizer, (PFIZER-BIONT COVID-19 VAC-TRIS) SUSP injection Inject into the muscle. 0.3 mL 0   cycloSPORINE (RESTASIS) 0.05 % ophthalmic emulsion Place 1 drop into both eyes 2 (two) times daily.       dorzolamide (TRUSOPT) 2 % ophthalmic solution 1 drop 2 (two) times daily.     fish oil-omega-3 fatty acids 1000 MG capsule Take 2 g by mouth daily.       gabapentin (NEURONTIN) 300 MG capsule Take 300 mg by mouth daily at 12 noon.     latanoprost (XALATAN) 0.005 % ophthalmic solution Place 1 drop into both eyes at bedtime.     MELATONIN PO Take 6 mg by mouth at bedtime as needed.      metoprolol succinate (TOPROL-XL) 25 MG 24 hr tablet TAKE 1 TABLET DAILY WITH OR IMMEDIATELY FOLLOWING A MEAL. 90 tablet 3   Multiple Vitamins-Minerals (MULTIVITAMIN WITH MINERALS) tablet Take 1 tablet by mouth daily.       polyethylene glycol powder (GLYCOLAX/MIRALAX) 17 GM/SCOOP powder Take 17 g by mouth daily as needed.      sertraline (ZOLOFT) 50 MG tablet TAKE 1 AND 1/2 TABLETS EVERY DAY 135 tablet 3   triamcinolone lotion (KENALOG) 0.1 %      No facility-administered medications prior to visit.     Per HPI unless  specifically indicated in ROS section below Review of Systems  Constitutional:  Negative for chills, fatigue and fever.  HENT:  Positive for postnasal drip, rhinorrhea and sinus pressure (cough). Negative for congestion and sore throat.   Respiratory:  Positive for cough (Thick and dark yellow and beige color). Negative for shortness of breath.   Cardiovascular:  Negative for chest pain.  Gastrointestinal:  Negative for abdominal pain, constipation, diarrhea, nausea and vomiting.  Musculoskeletal:  Negative for arthralgias and myalgias.  Neurological:  Negative for headaches.  Objective:  BP 117/78    Pulse 72   Temp (!) 97 F (36.1 C)   LMP 09/13/2000   SpO2 94%   Wt Readings from Last 3 Encounters:  03/12/21 137 lb 8 oz (62.4 kg)  03/05/20 134 lb 8 oz (61 kg)  01/01/20 138 lb 1.6 oz (62.6 kg)       Physical exam: Gen: alert, NAD, not ill appearing Pulm: speaks in complete sentences without increased work of breathing Psych: normal mood, normal thought content      Results for orders placed or performed in visit on 03/13/21  Lipid panel  Result Value Ref Range   Cholesterol 140 0 - 200 mg/dL   Triglycerides 80.0 0.0 - 149.0 mg/dL   HDL 54.20 >39.00 mg/dL   VLDL 16.0 0.0 - 40.0 mg/dL   LDL Cholesterol 70 0 - 99 mg/dL   Total CHOL/HDL Ratio 3    NonHDL 86.19   Comprehensive metabolic panel  Result Value Ref Range   Sodium 138 135 - 145 mEq/L   Potassium 4.7 3.5 - 5.1 mEq/L   Chloride 99 96 - 112 mEq/L   CO2 33 (H) 19 - 32 mEq/L   Glucose, Bld 89 70 - 99 mg/dL   BUN 17 6 - 23 mg/dL   Creatinine, Ser 1.03 0.40 - 1.20 mg/dL   Total Bilirubin 0.6 0.2 - 1.2 mg/dL   Alkaline Phosphatase 60 39 - 117 U/L   AST 20 0 - 37 U/L   ALT 14 0 - 35 U/L   Total Protein 6.9 6.0 - 8.3 g/dL   Albumin 4.1 3.5 - 5.2 g/dL   GFR 53.57 (L) >60.00 mL/min   Calcium 9.8 8.4 - 10.5 mg/dL   Assessment & Plan:   Problem List Items Addressed This Visit       Respiratory   Upper respiratory tract infection - Primary    Patient and spouse were both exposed to her sister who developed a bronchitis has been had to be treated and retreated for his illness she is having same and similar symptoms we will go ahead and treat her with azithromycin pack.  Did discuss signs and symptoms as when to seek urgent or emergent health care.  Also reinforced drinking plenty of fluids with guaifenesin in order to thin secretions of the chest.  Continue to monitor. Start azithromycin 250 mg as directed and guaifenesin 600 mg twice daily for 7 days.      Relevant Medications   guaiFENesin (MUCINEX) 600 MG 12 hr  tablet   azithromycin (ZITHROMAX) 250 MG tablet     No orders of the defined types were placed in this encounter.  No orders of the defined types were placed in this encounter.   I discussed the assessment and treatment plan with the patient. The patient was provided an opportunity to ask questions and all were answered. The patient agreed with the plan and demonstrated an understanding of the instructions. The patient was  advised to call back or seek an in-person evaluation if the symptoms worsen or if the condition fails to improve as anticipated.  Follow up plan: No follow-ups on file.  Romilda Garret, NP

## 2021-04-30 ENCOUNTER — Other Ambulatory Visit: Payer: Self-pay | Admitting: Family Medicine

## 2021-04-30 DIAGNOSIS — E782 Mixed hyperlipidemia: Secondary | ICD-10-CM

## 2021-05-10 ENCOUNTER — Emergency Department
Admission: EM | Admit: 2021-05-10 | Discharge: 2021-05-10 | Disposition: A | Discharge status: Home / Self Care | Payer: Medicare HMO | Attending: Emergency Medicine | Admitting: Emergency Medicine

## 2021-05-10 ENCOUNTER — Emergency Department: Payer: Medicare HMO

## 2021-05-10 ENCOUNTER — Encounter: Payer: Self-pay | Admitting: Intensive Care

## 2021-05-10 ENCOUNTER — Other Ambulatory Visit: Payer: Self-pay

## 2021-05-10 DIAGNOSIS — Z79899 Other long term (current) drug therapy: Secondary | ICD-10-CM | POA: Insufficient documentation

## 2021-05-10 DIAGNOSIS — R519 Headache, unspecified: Secondary | ICD-10-CM | POA: Diagnosis not present

## 2021-05-10 DIAGNOSIS — N1831 Chronic kidney disease, stage 3a: Secondary | ICD-10-CM | POA: Diagnosis not present

## 2021-05-10 DIAGNOSIS — R52 Pain, unspecified: Secondary | ICD-10-CM | POA: Diagnosis not present

## 2021-05-10 DIAGNOSIS — S199XXA Unspecified injury of neck, initial encounter: Secondary | ICD-10-CM | POA: Insufficient documentation

## 2021-05-10 DIAGNOSIS — W109XXA Fall (on) (from) unspecified stairs and steps, initial encounter: Secondary | ICD-10-CM | POA: Diagnosis not present

## 2021-05-10 DIAGNOSIS — Y92009 Unspecified place in unspecified non-institutional (private) residence as the place of occurrence of the external cause: Secondary | ICD-10-CM | POA: Insufficient documentation

## 2021-05-10 DIAGNOSIS — S0003XA Contusion of scalp, initial encounter: Secondary | ICD-10-CM | POA: Diagnosis not present

## 2021-05-10 DIAGNOSIS — S0990XA Unspecified injury of head, initial encounter: Secondary | ICD-10-CM | POA: Diagnosis not present

## 2021-05-10 DIAGNOSIS — M549 Dorsalgia, unspecified: Secondary | ICD-10-CM | POA: Diagnosis not present

## 2021-05-10 DIAGNOSIS — Y9301 Activity, walking, marching and hiking: Secondary | ICD-10-CM | POA: Diagnosis not present

## 2021-05-10 DIAGNOSIS — Z7982 Long term (current) use of aspirin: Secondary | ICD-10-CM | POA: Insufficient documentation

## 2021-05-10 DIAGNOSIS — M542 Cervicalgia: Secondary | ICD-10-CM | POA: Diagnosis not present

## 2021-05-10 DIAGNOSIS — W19XXXA Unspecified fall, initial encounter: Secondary | ICD-10-CM

## 2021-05-10 DIAGNOSIS — I10 Essential (primary) hypertension: Secondary | ICD-10-CM | POA: Diagnosis not present

## 2021-05-10 DIAGNOSIS — M4312 Spondylolisthesis, cervical region: Secondary | ICD-10-CM | POA: Diagnosis not present

## 2021-05-10 DIAGNOSIS — R0902 Hypoxemia: Secondary | ICD-10-CM | POA: Diagnosis not present

## 2021-05-10 NOTE — Discharge Instructions (Signed)
CT of your head and neck showed no acute problems.  I have included some suggestions for fall preventions at home.  Please return for any further problems or increasing pain weakness etc.  Please continue to follow-up with your primary care doctor.

## 2021-05-10 NOTE — ED Notes (Signed)
PT verbalized consent for dc. Pt off unit with wheelchair with husband at side. Pt denies questions.

## 2021-05-10 NOTE — ED Provider Notes (Signed)
[facility] Emergency Department Provider Note   ____________________________________________   Event Date/Time   First MD Initiated Contact with Patient 05/10/21 1538     (approximate)  I have reviewed the triage vital signs and the nursing notes.   HISTORY  Chief Complaint Fall   HPI Dana Massey is a 75 y.o. female who reports she has chronic poor balance.  She was walking up the stairs at home which with a railing but did not grab it firmly lost her balance and fell backwards hitting her head on the floor.  She denies loss of consciousness or double vision or headache anywhere except for on the back of her head where she hit the floor.  She is not having nausea or vomiting.  She is not having any pain elsewhere besides the back of her head and her neck. She reports she has spinal fusion screws in her low back and that was hurting when she was laying on the concrete but it is not hurting now.      Past Medical History:  Diagnosis Date   Cataract    left eye surgery to remove, right eye just watching currently   Constipation    Depression    Diverticulosis of colon    Glaucoma    ocular htn bilateral eyes - uses drops    History of blood transfusion 1994   with spinal fusion surgery   IBS (irritable bowel syndrome)    Migraine headache    resolved, no current problem   Mitral valve prolapse    patient denies this dx   Neuromuscular disorder ([place])    essential tremor hands   Osteopenia    PAC (premature atrial contraction)    on metoprolol   Palpitations    Scoliosis    Shoulder pain    resolved, no loonger a problem   Toxic effect of venom(989.5)    Venous insufficiency     Patient Active Problem List   Diagnosis Date Noted   Upper respiratory tract infection 04/29/2021   Peripheral neuropathy 03/12/2021   Chronic left hip pain 03/05/2020   Mixed hyperlipidemia 09/18/2019   Chronic kidney disease, stage 3a ([place]) 09/18/2019    Obstructive sleep apnea 07/05/2019   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/2019   Osteopenia 02/08/2019   Open-angle glaucoma of both eyes, mild stage 02/07/2019   Essential tremor 05/20/2016   Depression 12/25/2015   BCC (basal cell carcinoma of skin) 07/29/2015   Reflex sympathetic dystrophy of lower extremity 06/05/2014   Migraine headache 10/10/2007   GLAUCOMA 10/10/2007   MITRAL VALVE PROLAPSE 10/10/2007   Venous (peripheral) insufficiency 10/10/2007   Diverticulosis of large intestine 10/10/2007   IRRITABLE BOWEL SYNDROME 10/10/2007   Disorder of bone and cartilage 10/10/2007   SCOLIOSIS 10/10/2007   PAC (premature atrial contraction) 10/10/2007    Past Surgical History:  Procedure Laterality Date   BACK SURGERY  1994   scoliosis at [facility] spine center   CATARACT EXTRACTION Left 04/2010   COLONOSCOPY  2009   [facility]rene Pastor- Normal   ROTATOR CUFF REPAIR  2006   WISDOM TOOTH EXTRACTION      Prior to Admission medications   Medication Sig Start Date End Date Taking? Authorizing Provider  Acetaminophen 500 MG coapsule  10/31/17   [provider]  aspirin EC 81 MG tablet Take 81 mg by mouth daily.    [provider]  atorvastatin (LIPITOR) 10 MG tablet TAKE 1 TABLET EVERY DAY (NEEDS PHYSICAL IN [place]) 04/30/21  Lesleigh Noe, MD  Calcium-Vitamin D-Vitamin K 500-100-40 MG-UNT-MCG CHEW Chew 1 capsule by mouth daily.      [provider]  COVID-19 mRNA bivalent vaccine, Pfizer, (PFIZER COVID-19 VAC BIVALENT) injection Inject into the muscle. 03/25/21   Carlyle Basques, MD  COVID-19 mRNA Vac-TriS, Pfizer, (PFIZER-BIONT COVID-19 VAC-TRIS) SUSP injection Inject into the muscle. 10/29/20   Carlyle Basques, MD  cycloSPORINE (RESTASIS) 0.05 % ophthalmic emulsion Place 1 drop into both eyes 2 (two) times daily.      [provider]  dorzolamide (TRUSOPT) 2 % ophthalmic solution 1 drop 2 (two) times daily.    [provider]  fish oil-omega-3 fatty acids  1000 MG capsule Take 2 g by mouth daily.      [provider]  gabapentin (NEURONTIN) 300 MG capsule Take 300 mg by mouth daily at 12 noon. 01/07/21   [provider]  latanoprost (XALATAN) 0.005 % ophthalmic solution Place 1 drop into both eyes at bedtime.    [provider]  MELATONIN PO Take 6 mg by mouth at bedtime as needed.     [provider]  metoprolol succinate (TOPROL-XL) 25 MG 24 hr tablet TAKE 1 TABLET DAILY WITH OR IMMEDIATELY FOLLOWING A MEAL. 01/16/21   Evans Lance, MD  Multiple Vitamins-Minerals (MULTIVITAMIN WITH MINERALS) tablet Take 1 tablet by mouth daily.      [provider]  polyethylene glycol powder (GLYCOLAX/MIRALAX) 17 GM/SCOOP powder Take 17 g by mouth daily as needed.     [provider]  sertraline (ZOLOFT) 50 MG tablet TAKE 1 AND 1/2 TABLETS EVERY DAY 04/01/21   Lesleigh Noe, MD  triamcinolone lotion (KENALOG) 0.1 %  01/30/20   [provider]    Allergies Wasp venom  Family History  Problem Relation Age of Onset   Diabetes Mother    Stroke Paternal Grandmother 54   Colon cancer Neg Hx    Rectal cancer Neg Hx    Stomach cancer Neg Hx    Esophageal cancer Neg Hx     Social History Social History   Tobacco Use   Smoking status: Never   Smokeless tobacco: Never  Vaping Use   Vaping Use: Never used  Substance Use Topics   Alcohol use: Never    Comment: rarely - holiday   Drug use: No    Review of Systems  Constitutional: No fever/chills Eyes: No visual changes. ENT: No sore throat. Cardiovascular: Denies chest pain. Respiratory: Denies shortness of breath. Gastrointestinal: No abdominal pain.  No nausea, no vomiting.  No diarrhea.  No constipation. Genitourinary: Negative for dysuria. Musculoskeletal: Negative for back pain. Skin: Negative for rash. Neurological: Negative for headaches, focal weakness   ____________________________________________   PHYSICAL  EXAM:  VITAL SIGNS: ED Triage Vitals  Enc Vitals Group     BP 05/10/21 1516 (!) 143/81     Pulse Rate 05/10/21 1516 64     Resp 05/10/21 1516 16     Temp 05/10/21 1516 97.7 F (36.5 C)     Temp Source 05/10/21 1516 Oral     SpO2 05/10/21 1516 95 %     Weight 05/10/21 1517 138 lb (62.6 kg)     Height 05/10/21 1517 5\' 4"  (1.626 m)     Head Circumference --      Peak Flow --      Pain Score 05/10/21 1517 6     Pain Loc --      Pain Edu? --  Excl. in [place]? --     Constitutional: Alert and oriented. Well appearing and in no acute distress. Eyes: Conjunctivae are normal. PER Head: Atraumatic. Nose: No congestion/rhinnorhea. Mouth/Throat: Mucous membranes are moist.  Oropharynx non-erythematous. Neck: No stridor.  Patient reports neck is diffusely painful cardiovascular: Normal rate, regular rhythm. Grossly normal heart sounds.  Good peripheral circulation.  No chest wall tenderness Respiratory: Normal respiratory effort.  No retractions. Lungs CTAB. Gastrointestinal: Soft and nontender. No distention. No abdominal bruits. No CVA tenderness. Musculoskeletal: No lower extremity tenderness no spinal tenderness on palpation below the neck. Neurologic:  Normal speech and language. No gross focal neurologic deficits are appreciated. Skin:  Skin is warm, dry and intact. No rash noted.   ____________________________________________   LABS (all labs ordered are listed, but only abnormal results are displayed)  Labs Reviewed - No data to display ____________________________________________  EKG   ____________________________________________  RADIOLOGY Gertha Calkin, personally viewed and evaluated these images (plain radiographs) as part of my medical decision making, as well as reviewing the written report by the radiologist.  ED MD interpretation: CT of the head and C-spine show no acute pathology.  Radiologist read the films and I reviewed them  Official radiology  report(s): CT Head Wo Contrast  Result Date: 05/10/2021 CLINICAL DATA:  Fall EXAM: CT HEAD WITHOUT CONTRAST CT CERVICAL SPINE WITHOUT CONTRAST TECHNIQUE: Multidetector CT imaging of the head and cervical spine was performed following the standard protocol without intravenous contrast. Multiplanar CT image reconstructions of the cervical spine were also generated. COMPARISON:  None. FINDINGS: CT HEAD FINDINGS Brain: No evidence of large-territorial acute infarction. No parenchymal hemorrhage. No mass lesion. No extra-axial collection. No mass effect or midline shift. No hydrocephalus. Basilar cisterns are patent. Vascular: No hyperdense vessel. Atherosclerotic calcifications are present within the cavernous internal carotid arteries. Skull: No acute fracture or focal lesion. Sinuses/Orbits: Paranasal sinuses and mastoid air cells are clear. The orbits are unremarkable. Other: Right scalp trace hematoma formation. CT CERVICAL SPINE FINDINGS Alignment: Grade 1 anterolisthesis of C4 on C5, C5 on C6. Skull base and vertebrae: No acute fracture. No aggressive appearing focal osseous lesion or focal pathologic process. Soft tissues and spinal canal: No prevertebral fluid or swelling. No visible canal hematoma. Upper chest: Trace biapical pleural/pulmonary scarring. Other: None. IMPRESSION: 1. No acute intracranial abnormality. 2. No acute displaced fracture or traumatic listhesis of the cervical spine. Electronically Signed   By: Iven Finn M.D.   On: 05/10/2021 16:18   CT Cervical Spine Wo Contrast  Result Date: 05/10/2021 CLINICAL DATA:  Fall EXAM: CT HEAD WITHOUT CONTRAST CT CERVICAL SPINE WITHOUT CONTRAST TECHNIQUE: Multidetector CT imaging of the head and cervical spine was performed following the standard protocol without intravenous contrast. Multiplanar CT image reconstructions of the cervical spine were also generated. COMPARISON:  None. FINDINGS: CT HEAD FINDINGS Brain: No evidence of  large-territorial acute infarction. No parenchymal hemorrhage. No mass lesion. No extra-axial collection. No mass effect or midline shift. No hydrocephalus. Basilar cisterns are patent. Vascular: No hyperdense vessel. Atherosclerotic calcifications are present within the cavernous internal carotid arteries. Skull: No acute fracture or focal lesion. Sinuses/Orbits: Paranasal sinuses and mastoid air cells are clear. The orbits are unremarkable. Other: Right scalp trace hematoma formation. CT CERVICAL SPINE FINDINGS Alignment: Grade 1 anterolisthesis of C4 on C5, C5 on C6. Skull base and vertebrae: No acute fracture. No aggressive appearing focal osseous lesion or focal pathologic process. Soft tissues and spinal canal: No prevertebral fluid or swelling.  No visible canal hematoma. Upper chest: Trace biapical pleural/pulmonary scarring. Other: None. IMPRESSION: 1. No acute intracranial abnormality. 2. No acute displaced fracture or traumatic listhesis of the cervical spine. Electronically Signed   By: Iven Finn M.D.   On: 05/10/2021 16:18    ____________________________________________   PROCEDURES  Procedure(s) performed (including Critical Care):  Procedures   ____________________________________________   INITIAL IMPRESSION / ASSESSMENT AND PLAN / ED COURSE   ----------------------------------------- 4:41 PM on 05/10/2021 ----------------------------------------- Patient seems to be doing well.  She does not appear to be anywhere out of baseline.  I will let her go.             ____________________________________________   FINAL CLINICAL IMPRESSION(S) / ED DIAGNOSES  Final diagnoses:  Fall, initial encounter     ED Discharge Orders     None        Note:  This document was prepared using Dragon voice recognition software and may include unintentional dictation errors.    Nena Polio, MD 05/10/21 [phone]

## 2021-05-10 NOTE — ED Triage Notes (Signed)
Patient had mechanical fall at home. Fell backwards hitting back of head on cement. Denies LOC. Takes aspirin daily. C/o neck pain and head pain. Intermittent back pain. HX back surgery.

## 2021-05-10 NOTE — ED Notes (Signed)
Pt to ED via [facility] from home for fall. Pt was walking up steps and fell backwards and hit her head. Pt is not on blood thinners. Pt is c/o dizziness and nausea. Pt was on the ground for about 2 hours before husband got here. VSS. Pt is in NAD.

## 2021-05-10 NOTE — ED Notes (Signed)
PT endorsing that they fell and is experiencing neck pain.

## 2021-05-15 ENCOUNTER — Other Ambulatory Visit: Payer: Self-pay

## 2021-05-15 ENCOUNTER — Ambulatory Visit (INDEPENDENT_AMBULATORY_CARE_PROVIDER_SITE_OTHER): Payer: Medicare HMO | Admitting: Nurse Practitioner

## 2021-05-15 VITALS — BP 132/98 | HR 71 | Temp 98.0°F | Resp 12 | Ht 64.0 in | Wt 141.1 lb

## 2021-05-15 DIAGNOSIS — W108XXA Fall (on) (from) other stairs and steps, initial encounter: Secondary | ICD-10-CM | POA: Diagnosis not present

## 2021-05-15 DIAGNOSIS — S060X0A Concussion without loss of consciousness, initial encounter: Secondary | ICD-10-CM | POA: Diagnosis not present

## 2021-05-15 DIAGNOSIS — F0781 Postconcussional syndrome: Secondary | ICD-10-CM | POA: Diagnosis not present

## 2021-05-15 DIAGNOSIS — W19XXXD Unspecified fall, subsequent encounter: Secondary | ICD-10-CM | POA: Diagnosis not present

## 2021-05-15 DIAGNOSIS — W19XXXA Unspecified fall, initial encounter: Secondary | ICD-10-CM | POA: Insufficient documentation

## 2021-05-15 NOTE — Assessment & Plan Note (Signed)
Patient was seen and evaluated in the emergency department on 05/10/2021.  She did receive CT scan of head and neck that were cleared.  And discharged home she during hospital follow-up today.  Patient is stable neuro exam intact continue to monitor.  She is had 2 falls this entire year both mechanical in nature.  Just continue to monitor.

## 2021-05-15 NOTE — Patient Instructions (Addendum)
Nice to see you today Your symptoms should continue to resolve and then be gone. If not over the next week or so give [place] a call or send me a mychart message

## 2021-05-15 NOTE — Progress Notes (Signed)
Established Patient Office Visit  Subjective:  Patient ID: Dana Massey, female    DOB: [birth year]  Age: 75 y.o. MRN: [id number]  CC:  Chief Complaint  Patient presents with   Fall    ER follow up. [place] backwards on the steps in the garage on 05/10/21, hit her head on the concrete. Has a bruise on left buttocks, contusion of the back of the head. Has had dizziness when she lays down and when she gets up, slight nausea over the weekend. No blurred vision.     HPI Dana Massey presents for Hospital follow up ED visit at [facility] on 05/10/2021  Patient was in garage going to get something out of the freezer. Was going up the steps, reached for hand rail and missed it, fell backwards on concrete and laid there for 2 hours. No LOC. Did strike the back right side of her head and the left buttocks region  Feels like she is improving since the fall Occ headaches and dizziness with laying down and getting up and associated nausea. States improved  Past Medical History:  Diagnosis Date   Cataract    left eye surgery to remove, right eye just watching currently   Constipation    Depression    Diverticulosis of colon    Glaucoma    ocular htn bilateral eyes - uses drops    History of blood transfusion 1994   with spinal fusion surgery   IBS (irritable bowel syndrome)    Migraine headache    resolved, no current problem   Mitral valve prolapse    patient denies this dx   Neuromuscular disorder ([place])    essential tremor hands   Osteopenia    PAC (premature atrial contraction)    on metoprolol   Palpitations    Scoliosis    Shoulder pain    resolved, no loonger a problem   Toxic effect of venom(989.5)    Venous insufficiency     Past Surgical History:  Procedure Laterality Date   BACK SURGERY  1994   scoliosis at [facility] spine center   CATARACT EXTRACTION Left 04/2010   COLONOSCOPY  2009   [place]- Normal   ROTATOR CUFF REPAIR  2006   WISDOM TOOTH EXTRACTION      Family  History  Problem Relation Age of Onset   Diabetes Mother    Stroke Paternal Grandmother 73   Colon cancer Neg Hx    Rectal cancer Neg Hx    Stomach cancer Neg Hx    Esophageal cancer Neg Hx     Social History   Socioeconomic History   Marital status: Married    Spouse name: [name]   Number of children: 2   Years of education: associate degree   Highest education level: Not on file  Occupational History   Not on file  Tobacco Use   Smoking status: Never   Smokeless tobacco: Never  Vaping Use   Vaping Use: Never used  Substance and Sexual Activity   Alcohol use: Never    Comment: rarely - holiday   Drug use: No   Sexual activity: Yes    Partners: Male    Birth control/protection: Post-menopausal    Comment: husband vasectomy  Other Topics Concern   Not on file  Social History Narrative   Does not have a living will.   Desires CPR but would not want prolonged life support.   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       Family: 2 children - Rodman Key and Hydrologist - both [place], 3 grandchildren (age 74 to 99)      Enjoys: reading, knitting, exercise, watch TV, watch husband softball      Exercise: 4 times a week - 89 minute senior fitness class   Diet: tries to eat right - fish a few times a week, no beef, not as many veggies as she should      Safety   Seat belts: Yes    Guns: Yes  and secure   Safe in relationships: Yes          Social Determinants of Health   Financial Resource Strain: Not on file  Food Insecurity: Not on file  Transportation Needs: Not on file  Physical Activity: Not on file  Stress: Not on file  Social Connections: Not on file  Intimate Partner Violence: Not on file    Outpatient Medications Prior to Visit  Medication Sig Dispense Refill   Acetaminophen 500 MG coapsule      aspirin EC 81 MG  tablet Take 81 mg by mouth daily.     atorvastatin (LIPITOR) 10 MG tablet TAKE 1 TABLET EVERY DAY (NEEDS PHYSICAL IN SEPTEMBER) 90 tablet 3   Calcium-Vitamin D-Vitamin K 500-100-40 MG-UNT-MCG CHEW Chew 1 capsule by mouth daily.       cycloSPORINE (RESTASIS) 0.05 % ophthalmic emulsion Place 1 drop into both eyes 2 (two) times daily.       dorzolamide (TRUSOPT) 2 % ophthalmic solution 1 drop 2 (two) times daily.     fish oil-omega-3 fatty acids 1000 MG capsule Take 2 g by mouth daily.       gabapentin (NEURONTIN) 300 MG capsule Take 300 mg by mouth daily at 12 noon.     latanoprost (XALATAN) 0.005 % ophthalmic solution Place 1 drop into both eyes at bedtime.     MELATONIN PO Take 6 mg by mouth at bedtime as needed.      metoprolol succinate (TOPROL-XL) 25 MG 24 hr tablet TAKE 1 TABLET DAILY WITH OR IMMEDIATELY FOLLOWING A MEAL. 90 tablet 3   Multiple Vitamins-Minerals (MULTIVITAMIN WITH MINERALS) tablet Take 1 tablet by mouth daily.       polyethylene glycol powder (GLYCOLAX/MIRALAX) 17 GM/SCOOP powder Take 17 g by mouth daily as needed.      sertraline (ZOLOFT) 50 MG tablet TAKE 1 AND 1/2 TABLETS EVERY DAY 135 tablet 3   triamcinolone lotion (KENALOG) 0.1 %      COVID-19 mRNA bivalent vaccine, Pfizer, (PFIZER COVID-19 VAC BIVALENT) injection Inject into the muscle. 0.3 mL 0   COVID-19 mRNA Vac-TriS, Pfizer, (PFIZER-BIONT COVID-19 VAC-TRIS) SUSP injection Inject into the muscle. 0.3 mL 0   No facility-administered medications prior to visit.    Allergies  Allergen Reactions   Wasp Venom Anaphylaxis    Has EpiPen Has EpiPen    ROS Review of Systems  Constitutional:  Negative for chills and fever.  Respiratory:  Negative for cough and shortness of breath.   Cardiovascular:  Negative for chest pain.  Gastrointestinal:  Positive for nausea. Negative for vomiting.  Musculoskeletal:  Negative for neck pain.  Skin:  Positive for color change.       bruising  Neurological:  Positive for  dizziness and headaches. Negative for weakness.  Psychiatric/Behavioral:  Negative for confusion.  Objective:    Physical Exam Vitals and nursing note reviewed.  Constitutional:      Appearance: Normal appearance.  HENT:     Right Ear: Tympanic membrane, ear canal and external ear normal. There is no impacted cerumen.     Left Ear: Tympanic membrane, ear canal and external ear normal. There is no impacted cerumen.     Mouth/Throat:     Mouth: Mucous membranes are moist.  Eyes:     Extraocular Movements: Extraocular movements intact.     Pupils: Pupils are equal, round, and reactive to light.  Cardiovascular:     Rate and Rhythm: Normal rate and regular rhythm.  Pulmonary:     Effort: Pulmonary effort is normal.     Breath sounds: Normal breath sounds.  Abdominal:     General: Bowel sounds are normal.  Musculoskeletal:     Cervical back: No tenderness.     Right lower leg: No edema.     Left lower leg: Edema (Baseline per patient) present.  Skin:    General: Skin is warm.     Findings: Ecchymosis present.       Neurological:     General: No focal deficit present.     Mental Status: She is alert.     Motor: No weakness.     Coordination: Coordination normal.     Gait: Gait normal.     Deep Tendon Reflexes: Reflexes normal.     Reflex Scores:      Bicep reflexes are 2+ on the right side and 2+ on the left side.      Patellar reflexes are 1+ on the right side and 2+ on the left side. Psychiatric:        Mood and Affect: Mood normal.        Behavior: Behavior normal.        Thought Content: Thought content normal.        Judgment: Judgment normal.    BP (!) 132/98   Pulse 71   Temp 98 F (36.7 C)   Resp 12   Ht 5\' 4"  (1.626 m)   Wt 141 lb 2 oz (64 kg)   LMP 09/13/2000   SpO2 98%   BMI 24.22 kg/m  Wt Readings from Last 3 Encounters:  05/15/21 141 lb 2 oz (64 kg)  05/10/21 138 lb (62.6 kg)  03/12/21 137 lb 8 oz (62.4 kg)     Health Maintenance Due   Topic Date Due   Zoster Vaccines- Shingrix (1 of 2) Never done    There are no preventive care reminders to display for this patient.  Lab Results  Component Value Date   TSH 2.21 02/08/2019   Lab Results  Component Value Date   WBC 5.9 01/01/2020   HGB 14.6 01/01/2020   HCT 43.6 01/01/2020   MCV 96.0 01/01/2020   PLT 159 01/01/2020   Lab Results  Component Value Date   NA 138 03/13/2021   K 4.7 03/13/2021   CO2 33 (H) 03/13/2021   GLUCOSE 89 03/13/2021   BUN 17 03/13/2021   CREATININE 1.03 03/13/2021   BILITOT 0.6 03/13/2021   ALKPHOS 60 03/13/2021   AST 20 03/13/2021   ALT 14 03/13/2021   PROT 6.9 03/13/2021   ALBUMIN 4.1 03/13/2021   CALCIUM 9.8 03/13/2021   ANIONGAP 7 07/05/2019   GFR 53.57 (L) 03/13/2021   Lab Results  Component Value Date   CHOL 140 03/13/2021   Lab Results  Component Value  Date   HDL 54.20 03/13/2021   Lab Results  Component Value Date   LDLCALC 70 03/13/2021   Lab Results  Component Value Date   TRIG 80.0 03/13/2021   Lab Results  Component Value Date   CHOLHDL 3 03/13/2021   No results found for: HGBA1C    Assessment & Plan:   Problem List Items Addressed This Visit       Nervous and Auditory   Post concussion syndrome    Patient having symptoms consistent with a postconcussion syndrome.  Discussed this with patient and spouse at bedside also print off material to give patient to review when she gets home.  Her symptoms are resolving I expect that they will resolve completely.  Did discuss signs and symptoms of when to seek urgent or emergent health care or when to reach back out to the office.  Patient acknowledged        Other   Fall - Primary    Patient was seen and evaluated in the emergency department on 05/10/2021.  She did receive CT scan of head and neck that were cleared.  And discharged home she during hospital follow-up today.  Patient is stable neuro exam intact continue to monitor.  She is had 2 falls  this entire year both mechanical in nature.  Just continue to monitor.       No orders of the defined types were placed in this encounter.   Follow-up: Return if symptoms worsen or fail to improve.   This visit occurred during the SARS-CoV-2 public health emergency.  Safety protocols were in place, including screening questions prior to the visit, additional usage of staff PPE, and extensive cleaning of exam room while observing appropriate contact time as indicated for disinfecting solutions.   Romilda Garret, NP

## 2021-05-15 NOTE — Assessment & Plan Note (Signed)
Patient having symptoms consistent with a postconcussion syndrome.  Discussed this with patient and spouse at bedside also print off material to give patient to review when she gets home.  Her symptoms are resolving I expect that they will resolve completely.  Did discuss signs and symptoms of when to seek urgent or emergent health care or when to reach back out to the office.  Patient acknowledged

## 2021-06-19 ENCOUNTER — Other Ambulatory Visit: Payer: Self-pay

## 2021-06-20 ENCOUNTER — Encounter: Payer: Self-pay | Admitting: Nurse Practitioner

## 2021-06-23 ENCOUNTER — Other Ambulatory Visit: Payer: Self-pay

## 2021-06-23 MED ORDER — MECLIZINE HCL 12.5 MG PO TABS: 12.5000 mg | ORAL_TABLET | Freq: Two times a day (BID) | ORAL | 0 refills | Status: AC | PRN

## 2021-06-23 MED ORDER — ZOSTER VAC RECOMB ADJUVANTED 50 MCG/0.5ML IM SUSR
INTRAMUSCULAR | 1 refills | Status: DC
  Filled 2021-06-23: qty 1, 1d supply, fill #0
  Filled 2021-09-05: qty 1, 1d supply, fill #1

## 2021-06-23 NOTE — Telephone Encounter (Signed)
Spoke to patient by telephone and was advised that she has not tried any OTC medications for the dizziness. Patient denies any other symptoms. Patient stated that her blood pressure has been normal with a reading yesterday of 112/76. Patient stated that she has not taken her blood pressure at the time she has felt dizzy. Patient stated that the dizziness started after the concussion. Patient stated that it comes and goes. Patient stated that she will have the dizziness and then will not have it for a week or so and then it will start back. Patient stated that she only seems to have the dizziness when she goes to bed at night. Patient stated that the dizziness occurs when she is lying down and at times when she turns over in the bed.

## 2021-06-23 NOTE — Telephone Encounter (Signed)
Can we triage the my chart message please

## 2021-06-30 NOTE — Telephone Encounter (Signed)
Did you want patient to be seen before then?

## 2021-06-30 NOTE — Telephone Encounter (Signed)
That is fine to wait until wednesday

## 2021-07-02 ENCOUNTER — Other Ambulatory Visit: Payer: Self-pay

## 2021-07-02 ENCOUNTER — Ambulatory Visit (INDEPENDENT_AMBULATORY_CARE_PROVIDER_SITE_OTHER): Payer: Medicare HMO | Admitting: Nurse Practitioner

## 2021-07-02 VITALS — BP 132/76 | HR 73 | Temp 98.0°F | Resp 10 | Ht 64.0 in | Wt 142.1 lb

## 2021-07-02 DIAGNOSIS — R42 Dizziness and giddiness: Secondary | ICD-10-CM | POA: Diagnosis not present

## 2021-07-02 DIAGNOSIS — I951 Orthostatic hypotension: Secondary | ICD-10-CM

## 2021-07-02 DIAGNOSIS — H8111 Benign paroxysmal vertigo, right ear: Secondary | ICD-10-CM | POA: Diagnosis not present

## 2021-07-02 DIAGNOSIS — H8110 Benign paroxysmal vertigo, unspecified ear: Secondary | ICD-10-CM | POA: Insufficient documentation

## 2021-07-02 NOTE — Patient Instructions (Signed)
Nice to see you today You do have some orthostatic hypotension. Need to make sure you are staying hydrated I will send you to PT to help with the vertigo. Follow up as needed

## 2021-07-02 NOTE — Assessment & Plan Note (Signed)
Patient's exam and symptoms are consistent with BPPV.  Patient symptoms are reproducible on exam patient has tried meclizine in the past without help.  Did print off information about Epley maneuver and referred to neuro PT for further evaluation.

## 2021-07-02 NOTE — Assessment & Plan Note (Signed)
Patient did have appreciable orthostatic changes did print off information in regards to that and also encouraged adequate fluid intake.  She needs to use caution with changes in position which she states she is already doing.

## 2021-07-02 NOTE — Progress Notes (Signed)
Acute Office Visit  Subjective:    Patient ID: Dana Massey, female    DOB: June 08, 1946, 76 y.o.   MRN: [id number]  Chief Complaint  Patient presents with   Follow up on post concussion    Certain movements and positions make patient dizzy. Dizziness is worse than it was last time she had a visit. Some blurred vision. No nausea, no vomiting.    HPI Patient is in today for Since 05/10/2021  States that it has been the same for the majority of the time and worse over the last couple weeks.   States that when she lays down and gets up or when she lays down and rolls over to the right. This does not happen on the left side. Does endorse blurred vision but this is not a sudden onset and has been gradual she states that she does have glasses and has an eye appointment in March 2023 Did try mecliizine without any effect. No OTC treatments.  Past Medical History:  Diagnosis Date   Cataract    left eye surgery to remove, right eye just watching currently   Constipation    Depression    Diverticulosis of colon    Glaucoma    ocular htn bilateral eyes - uses drops    History of blood transfusion 1994   with spinal fusion surgery   IBS (irritable bowel syndrome)    Migraine headache    resolved, no current problem   Mitral valve prolapse    patient denies this dx   Neuromuscular disorder ([place])    essential tremor hands   Osteopenia    PAC (premature atrial contraction)    on metoprolol   Palpitations    Scoliosis    Shoulder pain    resolved, no loonger a problem   Toxic effect of venom(989.5)    Venous insufficiency     Past Surgical History:  Procedure Laterality Date   BACK SURGERY  1994   scoliosis at [facility] spine center   CATARACT EXTRACTION Left 04/2010   COLONOSCOPY  2009   [facility]- Normal   ROTATOR CUFF REPAIR  2006   WISDOM TOOTH EXTRACTION      Family History  Problem Relation Age of Onset   Diabetes Mother    Stroke Paternal Grandmother 48   Colon  cancer Neg Hx    Rectal cancer Neg Hx    Stomach cancer Neg Hx    Esophageal cancer Neg Hx     Social History   Socioeconomic History   Marital status: Married    Spouse name: [name]   Number of children: 2   Years of education: associate degree   Highest education level: Not on file  Occupational History   Not on file  Tobacco Use   Smoking status: Never   Smokeless tobacco: Never  Vaping Use   Vaping Use: Never used  Substance and Sexual Activity   Alcohol use: Never    Comment: rarely - holiday   Drug use: No   Sexual activity: Yes    Partners: Male    Birth control/protection: Post-menopausal    Comment: husband vasectomy  Other Topics Concern   Not on file  Social History Narrative   Does not have a living will.   Desires CPR but would not want prolonged life support.   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       Family: 2 children - Rodman Key and Harrell Gave - both [place], 3 grandchildren (age 2 to 45)      Enjoys: reading, knitting, exercise, watch TV, watch husband softball      Exercise: 4 times a week - 69 minute senior fitness class   Diet: tries to eat right - fish a few times a week, no beef, not as many veggies as she should      Safety   Seat belts: Yes    Guns: Yes  and secure   Safe in relationships: Yes          Social Determinants of Health   Financial Resource Strain: Not on file  Food Insecurity: Not on file  Transportation Needs: Not on file  Physical Activity: Not on file  Stress: Not on file  Social Connections: Not on file  Intimate Partner Violence: Not on file    Outpatient Medications Prior to Visit  Medication Sig Dispense Refill   Acetaminophen 500 MG coapsule      aspirin EC 81 MG tablet Take 81 mg by mouth daily.     atorvastatin (LIPITOR) 10 MG tablet TAKE 1 TABLET EVERY DAY (NEEDS  PHYSICAL IN SEPTEMBER) 90 tablet 3   Calcium-Vitamin D-Vitamin K 500-100-40 MG-UNT-MCG CHEW Chew 1 capsule by mouth daily.       cycloSPORINE (RESTASIS) 0.05 % ophthalmic emulsion Place 1 drop into both eyes 2 (two) times daily.       dorzolamide (TRUSOPT) 2 % ophthalmic solution 1 drop 2 (two) times daily.     fish oil-omega-3 fatty acids 1000 MG capsule Take 2 g by mouth daily.       gabapentin (NEURONTIN) 300 MG capsule Take 300 mg by mouth daily at 12 noon.     latanoprost (XALATAN) 0.005 % ophthalmic solution Place 1 drop into both eyes at bedtime.     meclizine (ANTIVERT) 12.5 MG tablet Take 1 tablet (12.5 mg total) by mouth 2 (two) times daily as needed for up to 10 days for dizziness. 20 tablet 0   MELATONIN PO Take 6 mg by mouth at bedtime as needed.      metoprolol succinate (TOPROL-XL) 25 MG 24 hr tablet TAKE 1 TABLET DAILY WITH OR IMMEDIATELY FOLLOWING A MEAL. 90 tablet 3   Multiple Vitamins-Minerals (MULTIVITAMIN WITH MINERALS) tablet Take 1 tablet by mouth daily.       polyethylene glycol powder (GLYCOLAX/MIRALAX) 17 GM/SCOOP powder Take 17 g by mouth daily as needed.      sertraline (ZOLOFT) 50 MG tablet TAKE 1 AND 1/2 TABLETS EVERY DAY 135 tablet 3   triamcinolone lotion (KENALOG) 0.1 %      Zoster Vaccine Adjuvanted [facility]) injection Inject into the muscle. 1 each 1   No facility-administered medications prior to visit.    Allergies  Allergen Reactions   Wasp Venom Anaphylaxis    Has EpiPen Has EpiPen    Review of Systems  Constitutional:  Negative for fever.  Eyes:  Positive for visual disturbance (blurred more than normal at least a few months).  Respiratory:  Negative for shortness of breath.   Cardiovascular:  Negative for chest pain.  Gastrointestinal:  Negative for nausea and vomiting.  Musculoskeletal:  Negative for neck pain.  Neurological:  Positive for dizziness. Negative for facial asymmetry, speech difficulty, weakness, light-headedness, numbness and  headaches.  Psychiatric/Behavioral:  Negative for confusion.       Objective:  Physical Exam Constitutional:      Appearance: Normal appearance.  HENT:     Right Ear: Tympanic membrane, ear canal and external ear normal.     Left Ear: Tympanic membrane, ear canal and external ear normal.  Eyes:     Extraocular Movements: Extraocular movements intact.     Pupils: Pupils are equal, round, and reactive to light.     Comments: Wears corrective lenses. Did not have them on in office today   Cardiovascular:     Rate and Rhythm: Normal rate and regular rhythm.  Pulmonary:     Effort: Pulmonary effort is normal.     Breath sounds: Normal breath sounds.  Abdominal:     General: Bowel sounds are normal.  Lymphadenopathy:     Cervical: No cervical adenopathy.  Neurological:     Mental Status: She is alert.     Cranial Nerves: Cranial nerves 2-12 are intact.     Motor: No weakness.     Deep Tendon Reflexes:     Reflex Scores:      Bicep reflexes are 2+ on the right side and 2+ on the left side.      Patellar reflexes are 1+ on the right side and 2+ on the left side.    Comments: Bilateral upper and lower extremity strength 5/5 When laying patinet supine and ask her to turn her head to her right side the symptoms presented. Patient did experience horizontal nystagmus.  When going from a supine to seated position it did not cause nystagmus and she was able to stare at a certain place and get symptoms to abate  Neuro exam WNL     BP 132/76    Pulse 73    Temp 98 F (36.7 C)    Resp 10    Ht 5\' 4"  (1.626 m)    Wt 142 lb 2 oz (64.5 kg)    LMP 09/13/2000    SpO2 97%    BMI 24.40 kg/m  Wt Readings from Last 3 Encounters:  07/02/21 142 lb 2 oz (64.5 kg)  05/15/21 141 lb 2 oz (64 kg)  05/10/21 138 lb (62.6 kg)    There are no preventive care reminders to display for this patient.  There are no preventive care reminders to display for this patient.   Lab Results  Component Value  Date   TSH 2.21 02/08/2019   Lab Results  Component Value Date   WBC 5.9 01/01/2020   HGB 14.6 01/01/2020   HCT 43.6 01/01/2020   MCV 96.0 01/01/2020   PLT 159 01/01/2020   Lab Results  Component Value Date   NA 138 03/13/2021   K 4.7 03/13/2021   CO2 33 (H) 03/13/2021   GLUCOSE 89 03/13/2021   BUN 17 03/13/2021   CREATININE 1.03 03/13/2021   BILITOT 0.6 03/13/2021   ALKPHOS 60 03/13/2021   AST 20 03/13/2021   ALT 14 03/13/2021   PROT 6.9 03/13/2021   ALBUMIN 4.1 03/13/2021   CALCIUM 9.8 03/13/2021   ANIONGAP 7 07/05/2019   GFR 53.57 (L) 03/13/2021   Lab Results  Component Value Date   CHOL 140 03/13/2021   Lab Results  Component Value Date   HDL 54.20 03/13/2021   Lab Results  Component Value Date   LDLCALC 70 03/13/2021   Lab Results  Component Value Date   TRIG 80.0 03/13/2021   Lab Results  Component Value Date   CHOLHDL 3 03/13/2021   No results found for:  HGBA1C     Assessment & Plan:   Problem List Items Addressed This Visit       Nervous and Auditory   Benign paroxysmal positional vertigo of right ear - Primary    Patient's exam and symptoms are consistent with BPPV.  Patient symptoms are reproducible on exam patient has tried meclizine in the past without help.  Did print off information about Epley maneuver and referred to neuro PT for further evaluation.      Relevant Orders   Ambulatory referral to Physical Therapy     Other   Orthostatic dizziness    Patient did have appreciable orthostatic changes did print off information in regards to that and also encouraged adequate fluid intake.  She needs to use caution with changes in position which she states she is already doing.        No orders of the defined types were placed in this encounter.  This visit occurred during the SARS-CoV-2 public health emergency.  Safety protocols were in place, including screening questions prior to the visit, additional usage of staff PPE, and  extensive cleaning of exam room while observing appropriate contact time as indicated for disinfecting solutions.   Romilda Garret, NP

## 2021-07-03 ENCOUNTER — Encounter: Payer: Self-pay | Admitting: *Deleted

## 2021-07-22 ENCOUNTER — Ambulatory Visit: Payer: Medicare HMO | Attending: Nurse Practitioner | Admitting: Physical Therapy

## 2021-07-22 ENCOUNTER — Other Ambulatory Visit: Payer: Self-pay

## 2021-07-22 DIAGNOSIS — H8111 Benign paroxysmal vertigo, right ear: Secondary | ICD-10-CM | POA: Insufficient documentation

## 2021-07-22 DIAGNOSIS — R42 Dizziness and giddiness: Secondary | ICD-10-CM | POA: Diagnosis not present

## 2021-07-22 DIAGNOSIS — H8113 Benign paroxysmal vertigo, bilateral: Secondary | ICD-10-CM | POA: Diagnosis not present

## 2021-07-23 NOTE — Therapy (Signed)
[facility] [address] [place], [place], [address] Phone: [phone]   Fax:  [phone]  Physical Therapy Evaluation  Patient Details  Name: Dana Massey MRN: [id number] Date of Birth: Jun 20, 1945 Referring Provider (PT): Michela Pitcher, NP   Encounter Date: 07/22/2021   PT End of Session - 07/22/21 1651     Visit Number 1    Number of Visits 7    Date for PT Re-Evaluation 09/05/21    Authorization Type [employer] - have requested 6 visits    PT Start Time 1445    PT Stop Time 1530    PT Time Calculation (min) 45 min    Activity Tolerance Other (comment)   significant dizziness at end of treatment   Behavior During Therapy --             Past Medical History:  Diagnosis Date   Cataract    left eye surgery to remove, right eye just watching currently   Constipation    Depression    Diverticulosis of colon    Glaucoma    ocular htn bilateral eyes - uses drops    History of blood transfusion 1994   with spinal fusion surgery   IBS (irritable bowel syndrome)    Migraine headache    resolved, no current problem   Mitral valve prolapse    patient denies this dx   Neuromuscular disorder ([place])    essential tremor hands   Osteopenia    PAC (premature atrial contraction)    on metoprolol   Palpitations    Scoliosis    Shoulder pain    resolved, no loonger a problem   Toxic effect of venom(989.5)    Venous insufficiency     Past Surgical History:  Procedure Laterality Date   BACK SURGERY  1994   scoliosis at [facility] spine [facility]   CATARACT EXTRACTION Left 04/2010   COLONOSCOPY  2009   [place]- Normal   ROTATOR CUFF REPAIR  2006   WISDOM TOOTH EXTRACTION      There were no vitals filed for this visit.    Subjective Assessment - 07/22/21 1454     Subjective Fall on Nov 26th, climbing stairs to go into kitchen, lost balance and fell backwards,  Hit buttocks first and then back to back of  head.  - LOC.  Became dizzy just trying to get up, husband contacted EMS.  Had CT scan without any acute abnormalities.  Had a HA for a few days which has resolved.  Dizziness has persisted since the fall, mostly happens from supine > sit and when on R side.  Vision is a little blurry but is due for eye exam next month.  Denies neck pain, nausea or vomiting, has a history of tinnitus, denies changes in hearing.    Pertinent History cataract, depression, glaucoma, IBS, migraine HA, mitral valve prolapse, essential tremor hands, osteopenia, palpitations, scoliosis, venous insufficiency, scoliosis back surgery, rotator cuff repair    Diagnostic tests CT scan    Currently in Pain? No/denies                [facility] PT Assessment - 07/22/21 1457       Assessment   Medical Diagnosis H81.11 (ICD-10-CM) - Benign paroxysmal positional vertigo of right ear    Referring Provider (PT) Michela Pitcher, NP    Onset Date/Surgical Date 05/08/22    Prior Therapy yes for hip pain  Precautions   Precautions Other (comment)    Precaution Comments cataract, depression, glaucoma, IBS, migraine HA, mitral valve prolapse, essential tremor hands, osteopenia, palpitations, scoliosis, venous insufficiency, scoliosis back surgery, rotator cuff repair      Balance Screen   Has the patient fallen in the past 6 months Yes    How many times? [place] residence    Living Arrangements Spouse/significant other    Type of [place] to enter    Additional Comments Hasn't driven since fall; husband drives patient      Prior Function   Level of Independence Independent    Vocation Retired      Editor, commissioning Impaired by gross assessment    Additional Comments Peripheral neuropathy in R foot      ROM / Strength   AROM / PROM / Strength Strength      Strength   Overall Strength Within functional limits for tasks performed                Vestibular Assessment - 07/22/21 1501       Symptom Behavior   Type of Dizziness  Spinning    Frequency of Dizziness daily    Duration of Dizziness less than a minute    Symptom Nature Motion provoked;Positional    Aggravating Factors Lying supine;Supine to sit;Rolling to right    Relieving Factors Head stationary    Progression of Symptoms No change since onset      Oculomotor Exam   Oculomotor Alignment Normal    Ocular ROM WNL    Spontaneous Absent    Gaze-induced  Absent    Smooth Pursuits Intact    Saccades Intact    Comment Exophoria with cover-cross cover test      Oculomotor Exam-Fixation Suppressed    Left Head Impulse negative    Right Head Impulse negative      Vestibulo-Ocular Reflex   VOR to Slow Head Movement Normal    VOR Cancellation Normal      Positional Testing   Dix-Hallpike Dix-Hallpike Right;Dix-Hallpike Left    Horizontal Canal Testing Horizontal Canal Right;Horizontal Canal Left      Dix-Hallpike Right   Dix-Hallpike Right Duration 30 seconds then changing to downbeating nystagmus    Dix-Hallpike Right Symptoms Upbeat, right rotatory nystagmus      Dix-Hallpike Left   Dix-Hallpike Left Duration 0    Dix-Hallpike Left Symptoms Other (comment)   no nystagmus but pt reporting feeling spinning.     Horizontal Canal Right   Horizontal Canal Right Duration 0    Horizontal Canal Right Symptoms Normal      Horizontal Canal Left   Horizontal Canal Left Duration 0    Horizontal Canal Left Symptoms Normal                Objective measurements completed on examination: See above findings.        Vestibular Treatment/Exercise - 07/22/21 1648       Vestibular Treatment/Exercise   Vestibular Treatment Provided Canalith Repositioning    Canalith Repositioning Epley Manuever Right       EPLEY MANUEVER RIGHT   Number of Reps  2    Overall Response Symptoms Worsened    Response Details  during second treatment pt experienced  stronger intensity nystagmus during initial dix-hallpike position.  When returning to upright pt experienced sudden, severe retropulsion and required  total A of therapist to prevent a fall.  Required prolonged rest break in supine before returning to sitting slowly.               PT Education - 07/22/21 1650     Education Details pathophysiology of BPPV; likely has bilat BPPV based on nystagmus change; treatment for BPPV.  Will schedule follow up visits in [place].    Person(s) Educated Patient    Methods Explanation    Comprehension Verbalized understanding             PT Short Term Goals - 07/23/21 1201       PT SHORT TERM GOAL #1   Title = LTG              PT Long Term Goals - 07/23/21 1154       PT LONG TERM GOAL #1   Title Pt will demonstrate negative positional testing on R and L side    Baseline bilateral vs. multi-canal BPPV    Time 4    Period Weeks    Status New    Target Date 08/22/21      PT LONG TERM GOAL #2   Title Pt will report no dizziness at home when performing rolling to R side and performing supine <> sit    Time 4    Period Weeks    Status New    Target Date 08/22/21      PT LONG TERM GOAL #3   Title Standing balance goal as indicated.    Time 4    Period Weeks    Status New    Target Date 08/22/21              Plan - 07/23/21 1147     Clinical Impression Statement Pt is a 76 year old female referred to Neuro OPPT for evaluation of vertigo following fall and concussion in November 2022.  Pt's PMH is significant for the following: cataract, depression, glaucoma, IBS, migraine HA, mitral valve prolapse, essential tremor hands, osteopenia, palpitations, scoliosis, venous insufficiency, scoliosis back surgery, rotator cuff repair. The following deficits were noted during pt's exam: impaired standing balance, upbeating, R torsional nystagmus of short duration during R dix-hallpike that changed to downbeating, L torsional after 30  seconds.  Treated R posterior canal canalithiasis with CRM x 2.  Pt appears to present with bilateral or multi-canal BPPV and will benefit from ongoing assessment and treatment to address these impairments in order to reduce falls risk.    Personal Factors and Comorbidities Comorbidity 3+;Past/Current Experience    Comorbidities cataract, depression, glaucoma, IBS, migraine HA, mitral valve prolapse, essential tremor hands, osteopenia, palpitations, scoliosis, venous insufficiency, scoliosis back surgery, rotator cuff repair, fall/concussion, peripheral neuropathy    Examination-Activity Limitations Bed Mobility    Stability/Clinical Decision Making Stable/Uncomplicated    Clinical Decision Making Low    Rehab Potential Good    PT Frequency 1x / week    PT Duration 6 weeks    PT Treatment/Interventions ADLs/Self Care Home Management;Canalith Repostioning;Therapeutic activities;Therapeutic exercise;Balance training;Neuromuscular re-education;Patient/family education;Vestibular    PT Next Visit Plan recheck for R BPPV-treat as indicated.  Also check for L once R cleared - I think she may have bilateral.  Screen for any significant balance impairments - pt reports having premorbid balance issues due to peripheral neuropathy.    Consulted and Agree with Plan of Care Patient             Patient will benefit from  skilled therapeutic intervention in order to improve the following deficits and impairments:  Dizziness  Visit Diagnosis: BPPV (benign paroxysmal positional vertigo), bilateral  Dizziness and giddiness     Problem List Patient Active Problem List   Diagnosis Date Noted   Benign paroxysmal positional vertigo of right ear 07/02/2021   Orthostatic dizziness 07/02/2021   Post concussion syndrome 05/15/2021   Fall 05/15/2021   Upper respiratory tract infection 04/29/2021   Peripheral neuropathy 03/12/2021   Chronic left hip pain 03/05/2020   Mixed hyperlipidemia 09/18/2019    Chronic kidney disease, stage 3a ([place]) 09/18/2019   Obstructive sleep apnea 07/05/2019   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/2019   Osteopenia 02/08/2019   Open-angle glaucoma of both eyes, mild stage 02/07/2019   Essential tremor 05/20/2016   Depression 12/25/2015   BCC (basal cell carcinoma of skin) 07/29/2015   Reflex sympathetic dystrophy of lower extremity 06/05/2014   Migraine headache 10/10/2007   GLAUCOMA 10/10/2007   MITRAL VALVE PROLAPSE 10/10/2007   Venous (peripheral) insufficiency 10/10/2007   Diverticulosis of large intestine 10/10/2007   IRRITABLE BOWEL SYNDROME 10/10/2007   Disorder of bone and cartilage 10/10/2007   SCOLIOSIS 10/10/2007   PAC (premature atrial contraction) 10/10/2007    Rico Junker, PT, DPT 07/23/21    12:02 PM    [facility] [address] [place], [place], [address] Phone: [phone]   Fax:  [phone]  Name: Dana Massey MRN: [id number] Date of Birth: [birth year]

## 2021-07-29 ENCOUNTER — Other Ambulatory Visit: Payer: Self-pay

## 2021-07-29 ENCOUNTER — Encounter: Payer: Self-pay | Admitting: Physical Therapy

## 2021-07-29 ENCOUNTER — Ambulatory Visit: Payer: Medicare HMO | Attending: Nurse Practitioner | Admitting: Physical Therapy

## 2021-07-29 DIAGNOSIS — H8113 Benign paroxysmal vertigo, bilateral: Secondary | ICD-10-CM | POA: Diagnosis not present

## 2021-07-29 DIAGNOSIS — R42 Dizziness and giddiness: Secondary | ICD-10-CM | POA: Diagnosis not present

## 2021-07-29 NOTE — Therapy (Addendum)
[facility] SERVICES [address] [place], [place], [address] Phone: [phone]   Fax:  [phone]  Physical Therapy Treatment  Patient Details  Name: Dana Massey MRN: [id number] Date of Birth: 12/04/1945 Referring Provider (PT): Michela Pitcher, NP   Encounter Date: 07/29/2021   PT End of Session - 07/29/21 0841     Visit Number 2    Number of Visits 7    Date for PT Re-Evaluation 09/05/21    PT Start Time 0841    PT Stop Time 0930    PT Time Calculation (min) 49 min    Equipment Utilized During Treatment Gait belt    Activity Tolerance Patient tolerated treatment well    Behavior During Therapy WFL for tasks assessed/performed             Past Medical History:  Diagnosis Date   Cataract    left eye surgery to remove, right eye just watching currently   Constipation    Depression    Diverticulosis of colon    Glaucoma    ocular htn bilateral eyes - uses drops    History of blood transfusion 1994   with spinal fusion surgery   IBS (irritable bowel syndrome)    Migraine headache    resolved, no current problem   Mitral valve prolapse    patient denies this dx   Neuromuscular disorder ([place])    essential tremor hands   Osteopenia    PAC (premature atrial contraction)    on metoprolol   Palpitations    Scoliosis    Shoulder pain    resolved, no loonger a problem   Toxic effect of venom(989.5)    Venous insufficiency     Past Surgical History:  Procedure Laterality Date   BACK SURGERY  1994   scoliosis at [facility] spine center   CATARACT EXTRACTION Left 04/2010   COLONOSCOPY  2009   [place]- Normal   ROTATOR CUFF REPAIR  2006   WISDOM TOOTH EXTRACTION      There were no vitals filed for this visit.   Subjective Assessment - 07/29/21 0845     Subjective Pt reports improvement in her symptoms since last session. Pt reports "much better" and states she has not had any further episodes of vertigo. no  spinning when lying down and with turning her head to the right.    Pertinent History Fall on Nov 26th, climbing stairs to go into kitchen, lost balance and fell backwards,  Hit buttocks first and then back to back of head.  - LOC.  Became dizzy just trying to get up, husband contacted EMS.  Had CT scan without any acute abnormalities.  Had a HA for a few days which has resolved.  Dizziness has persisted since the fall, mostly happens from supine > sit and when on R side.  Vision is a little blurry but is due for eye exam next month.  Denies neck pain, nausea or vomiting, has a history of tinnitus, denies changes in hearing; cataract, depression, glaucoma, IBS, migraine HA, mitral valve prolapse, essential tremor hands, osteopenia, palpitations, scoliosis, venous insufficiency, scoliosis back surgery, rotator cuff repair    Patient Stated Goals Patient would like to be able to walk without veering                [facility] PT Assessment - 07/29/21 0903       Assessment   Medical Diagnosis H81.11 (ICD-10-CM) - Benign paroxysmal positional  vertigo of right ear    Referring Provider (PT) Michela Pitcher, NP    Onset Date/Surgical Date 05/08/22    Prior Therapy yes for hip pain      Precautions   Precautions Other (comment)    Precaution Comments cataract, depression, glaucoma, IBS, migraine HA, mitral valve prolapse, essential tremor hands, osteopenia, palpitations, scoliosis, venous insufficiency, scoliosis back surgery, rotator cuff repair      [place] residence    Living Arrangements Spouse/significant other    Type of [place] to enter    Additional Comments Hasn't driven since fall; husband drives patient      Prior Function   Level of Independence Independent    Vocation Retired      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  Clinical Test of Sensory Interaction for Balance (CTSIB): CONDITION TIME STRATEGY SWAY  Eyes open, firm surface 30 seconds ankle +1  Eyes closed, firm surface 30 seconds ankle +2  Eyes open, foam surface 30 seconds ankle +1  Eyes closed, foam surface 30 seconds Ankle, hip +3   Airex pad:  On firm surface and then on Airex pad, patient performed feet together and semi-tandem progressions with alternating lead leg with and without horizontal and vertical head turns.  Patient reports mild unsteadiness with this activity. Added this exercise to home exercise program with handout provided.  Ball tracking : Patient performed static stand while tossing ball horizontally and then vertically while tracking ball with head and eyes with verbal cues to turn the head. Added this exercise to HEP and handout provided.   Note: Portions of this document were prepared using Dragon voice recognition software and although reviewed may contain unintentional dictation errors in syntax, grammar, or spelling.   PT Education - 07/29/21 0841     Education Details Discussed canal testing, DGI and modified CTSIB testing; discussed plan of care and goals; issued standing ball toss to self with head and eye follows and feet together and semitandem progressions with head turns standing on pillow for home exercise program with handout via vestibular PowerPoint slides 1, 3, 4 provided    Person(s) Educated  Patient;Spouse    Methods Explanation;Demonstration;Handout;Verbal cues    Comprehension Verbalized understanding;Returned demonstration              PT Short Term Goals - 07/23/21 1201       PT SHORT TERM GOAL #1   Title = LTG               PT Long Term Goals - 07/23/21 1154       PT LONG TERM GOAL #1   Title Pt will demonstrate negative positional testing  on R and L side    Baseline bilateral vs. multi-canal BPPV    Time 4    Period Weeks    Status New    Target Date 08/22/21      PT LONG TERM GOAL #2   Title Pt will report no dizziness at home when performing rolling to R side and performing supine <> sit    Time 4    Period Weeks    Status New    Target Date 08/22/21      PT LONG TERM GOAL #3   Title Standing balance goal as indicated.    Time 4    Period Weeks    Status New    Target Date 08/22/21                   Plan - 07/30/21 1033     Clinical Impression Statement Patient returns to clinic this date reporting improvement in her vertigo symptoms.  Right and left Dix-Hallpike test were performed and both were negative with no nystagmus observed and patient denied dizziness symptoms.  Patient scored 23/24 in the dynamic gait index.  Patient demonstrated +2 sway with eyes closed on firm surface and +3 sway with eyes closed on foam surface with the clinical Test of sensory interaction for balance.  Patient reports that her goal is to be able to walk without veering.  Patient demonstrated unsteadiness when attempting to perform feet together and semitandem progressions on Airex pad with head turns.  Patient was issued home exercise program to address balance deficits and will plan on reviewing next session and progressing as able.  Patient would benefit from continued vestibular PT services to further address balance deficits and goals as set on plan of care.    Personal Factors and Comorbidities Comorbidity 3+;Past/Current Experience     Comorbidities cataract, depression, glaucoma, IBS, migraine HA, mitral valve prolapse, essential tremor hands, osteopenia, palpitations, scoliosis, venous insufficiency, scoliosis back surgery, rotator cuff repair, fall/concussion, peripheral neuropathy    Examination-Activity Limitations Bed Mobility    Stability/Clinical Decision Making Stable/Uncomplicated    Rehab Potential Good    PT Frequency 1x / week    PT Duration 6 weeks    PT Treatment/Interventions ADLs/Self Care Home Management;Canalith Repostioning;Therapeutic activities;Therapeutic exercise;Balance training;Neuromuscular re-education;Patient/family education;Vestibular    PT Next Visit Plan recheck for R BPPV-treat as indicated.  Also check for L once R cleared - I think she may have bilateral.  Screen for any significant balance impairments - pt reports having premorbid balance issues due to peripheral neuropathy.    Consulted and Agree with Plan of Care Patient             Patient will benefit from skilled therapeutic intervention in order to improve the following deficits and impairments:  Dizziness  Visit Diagnosis: Dizziness and giddiness  BPPV (benign paroxysmal positional vertigo), bilateral     Problem List Patient Active Problem List   Diagnosis Date Noted   Benign paroxysmal positional vertigo of right ear 07/02/2021   Orthostatic dizziness 07/02/2021   Post concussion syndrome 05/15/2021   Fall 05/15/2021   Upper respiratory tract infection 04/29/2021   Peripheral neuropathy 03/12/2021   Chronic left hip pain 03/05/2020   Mixed hyperlipidemia 09/18/2019   Chronic kidney disease, stage 3a ([place]) 09/18/2019   Obstructive sleep apnea 07/05/2019   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/2019   Osteopenia 02/08/2019   Open-angle glaucoma of both eyes, mild stage 02/07/2019   Essential tremor 05/20/2016   Depression 12/25/2015  BCC (basal cell carcinoma of skin) 07/29/2015   Reflex sympathetic dystrophy of  lower extremity 06/05/2014   Migraine headache 10/10/2007   GLAUCOMA 10/10/2007   MITRAL VALVE PROLAPSE 10/10/2007   Venous (peripheral) insufficiency 10/10/2007   Diverticulosis of large intestine 10/10/2007   IRRITABLE BOWEL SYNDROME 10/10/2007   Disorder of bone and cartilage 10/10/2007   SCOLIOSIS 10/10/2007   PAC (premature atrial contraction) 10/10/2007   Lady Deutscher PT, DPT #[id number]  Lady Deutscher, PT 07/30/2021, 10:36 AM  [place] MAIN [facility] SERVICES [address] [place], [place], [address] Phone: [phone]   Fax:  [phone]  Name: Dana Massey MRN: [id number] Date of Birth: 01-14-1946

## 2021-08-04 ENCOUNTER — Other Ambulatory Visit: Payer: Self-pay

## 2021-08-04 ENCOUNTER — Encounter: Payer: Self-pay | Admitting: Physical Therapy

## 2021-08-04 ENCOUNTER — Ambulatory Visit: Payer: Medicare HMO | Admitting: Physical Therapy

## 2021-08-04 DIAGNOSIS — R42 Dizziness and giddiness: Secondary | ICD-10-CM | POA: Diagnosis not present

## 2021-08-04 DIAGNOSIS — H8113 Benign paroxysmal vertigo, bilateral: Secondary | ICD-10-CM | POA: Diagnosis not present

## 2021-08-04 NOTE — Therapy (Signed)
[place] MAIN [facility] SERVICES [address] [place], [place], [address] Phone: [phone]   Fax:  [phone]  Physical Therapy Treatment  Patient Details  Name: Dana Massey MRN: [id number] Date of Birth: November 25, 1945 Referring Provider (PT): Michela Pitcher, NP   Encounter Date: 08/04/2021   PT End of Session - 08/04/21 1432     Visit Number 3    Number of Visits 7    Date for PT Re-Evaluation 09/05/21    PT Start Time 1432    Equipment Utilized During Treatment Gait belt    Activity Tolerance Patient tolerated treatment well    Behavior During Therapy [facility] for tasks assessed/performed             Past Medical History:  Diagnosis Date   Cataract    left eye surgery to remove, right eye just watching currently   Constipation    Depression    Diverticulosis of colon    Glaucoma    ocular htn bilateral eyes - uses drops    History of blood transfusion 1994   with spinal fusion surgery   IBS (irritable bowel syndrome)    Migraine headache    resolved, no current problem   Mitral valve prolapse    patient denies this dx   Neuromuscular disorder ([place])    essential tremor hands   Osteopenia    PAC (premature atrial contraction)    on metoprolol   Palpitations    Scoliosis    Shoulder pain    resolved, no loonger a problem   Toxic effect of venom(989.5)    Venous insufficiency     Past Surgical History:  Procedure Laterality Date   BACK SURGERY  1994   scoliosis at [facility] spine center   CATARACT EXTRACTION Left 04/2010   COLONOSCOPY  2009   [place]- Normal   ROTATOR CUFF REPAIR  2006   WISDOM TOOTH EXTRACTION      There were no vitals filed for this visit.    Airex pad:  On firm surface and then on Airex pad, patient performed feet together progressions (progressed to feet about 1.5" apart) and semi-tandem progressions with alternating lead leg with and without body turns and horizontal and vertical head turns.   Patient reports mild increase in dizziness with horizontal head turns.   Mini-squats: Patient performed 15 reps mini-squats with 5 second holds with verbal cues to maintain upright posture. On foam   Slow Marching: Patient performed on firm surface, slow marching with 5 seconds holds with CGA . [place] fpr JE{.  Ambulation with head turns forward and retro few episodes of veering and uneven steppage but no overt LOS.    Ambulation with head turns:  Patient performed 78' trials of forwards and retro ambulation with horizontal and vertical head turns with CGA.  Patient demonstrates no veering with retro ambulation with head turns.               PT Short Term Goals - 07/23/21 1201       PT SHORT TERM GOAL #1   Title = LTG               PT Long Term Goals - 07/23/21 1154       PT LONG TERM GOAL #1   Title Pt will demonstrate negative positional testing on R and L side    Baseline bilateral vs. multi-canal BPPV    Time 4    Period Weeks  Status New    Target Date 08/22/21      PT LONG TERM GOAL #2   Title Pt will report no dizziness at home when performing rolling to R side and performing supine <> sit    Time 4    Period Weeks    Status New    Target Date 08/22/21      PT LONG TERM GOAL #3   Title Standing balance goal as indicated.    Time 4    Period Weeks    Status New    Target Date 08/22/21                    Patient will benefit from skilled therapeutic intervention in order to improve the following deficits and impairments:     Visit Diagnosis: Dizziness and giddiness  BPPV (benign paroxysmal positional vertigo), bilateral     Problem List Patient Active Problem List   Diagnosis Date Noted   Benign paroxysmal positional vertigo of right ear 07/02/2021   Orthostatic dizziness 07/02/2021   Post concussion syndrome 05/15/2021   Fall 05/15/2021   Upper respiratory tract infection 04/29/2021   Peripheral neuropathy  03/12/2021   Chronic left hip pain 03/05/2020   Mixed hyperlipidemia 09/18/2019   Chronic kidney disease, stage 3a ([place]) 09/18/2019   Obstructive sleep apnea 07/05/2019   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/2019   Osteopenia 02/08/2019   Open-angle glaucoma of both eyes, mild stage 02/07/2019   Essential tremor 05/20/2016   Depression 12/25/2015   BCC (basal cell carcinoma of skin) 07/29/2015   Reflex sympathetic dystrophy of lower extremity 06/05/2014   Migraine headache 10/10/2007   GLAUCOMA 10/10/2007   MITRAL VALVE PROLAPSE 10/10/2007   Venous (peripheral) insufficiency 10/10/2007   Diverticulosis of large intestine 10/10/2007   IRRITABLE BOWEL SYNDROME 10/10/2007   Disorder of bone and cartilage 10/10/2007   SCOLIOSIS 10/10/2007   PAC (premature atrial contraction) 10/10/2007   Lady Deutscher PT, DPT #[id number] Lady Deutscher, PT 08/04/2021, 2:32 PM  [place] MAIN [facility] SERVICES [address] [place], [place], [address] Phone: [phone]   Fax:  [phone]  Name: Dana Massey MRN: [id number] Date of Birth: [birth year]

## 2021-08-13 ENCOUNTER — Other Ambulatory Visit: Payer: Self-pay

## 2021-08-13 ENCOUNTER — Encounter: Payer: Self-pay | Admitting: Physical Therapy

## 2021-08-13 ENCOUNTER — Ambulatory Visit: Payer: Medicare HMO | Attending: Nurse Practitioner | Admitting: Physical Therapy

## 2021-08-13 DIAGNOSIS — R42 Dizziness and giddiness: Secondary | ICD-10-CM | POA: Insufficient documentation

## 2021-08-13 DIAGNOSIS — H8113 Benign paroxysmal vertigo, bilateral: Secondary | ICD-10-CM | POA: Diagnosis not present

## 2021-08-14 NOTE — Therapy (Signed)
[place] MAIN [facility] SERVICES [address] [place], [place], [address] Phone: [phone]   Fax:  [phone]  Physical Therapy Treatment  Patient Details  Name: Dana Massey MRN: [id number] Date of Birth: [birth year] Referring Provider (PT): Michela Pitcher, NP   Encounter Date: 08/13/2021   PT End of Session - 08/13/21 1348     Visit Number 4    Number of Visits 7    Date for PT Re-Evaluation 09/05/21    PT Start Time 5643    PT Stop Time 1430    PT Time Calculation (min) 42 min    Equipment Utilized During Treatment Gait belt    Activity Tolerance Patient tolerated treatment well    Behavior During Therapy WFL for tasks assessed/performed             Past Medical History:  Diagnosis Date   Cataract    left eye surgery to remove, right eye just watching currently   Constipation    Depression    Diverticulosis of colon    Glaucoma    ocular htn bilateral eyes - uses drops    History of blood transfusion 1994   with spinal fusion surgery   IBS (irritable bowel syndrome)    Migraine headache    resolved, no current problem   Mitral valve prolapse    patient denies this dx   Neuromuscular disorder ([place])    essential tremor hands   Osteopenia    PAC (premature atrial contraction)    on metoprolol   Palpitations    Scoliosis    Shoulder pain    resolved, no loonger a problem   Toxic effect of venom(989.5)    Venous insufficiency     Past Surgical History:  Procedure Laterality Date   BACK SURGERY  1994   scoliosis at [facility] spine center   CATARACT EXTRACTION Left 04/2010   COLONOSCOPY  2009   [place]- Normal   ROTATOR CUFF REPAIR  2006   WISDOM TOOTH EXTRACTION      There were no vitals filed for this visit.   Canalith Repositioning Maneuver: Patient reports that night before last she had some dizziness but denied vertigo and therefore retested Dix-Hallpike test. On inverted mat table, performed right  Dix-Hallpike testing and noted 3 beats of upbeating torsional nystagmus of short duration without latency. Performed right canalith repositioning maneuver (CRT). Repeated right CRT for a total of 2 maneuvers with retesting between maneuvers. Patient denied vertigo.  Performed left Dix-Hallpike test and it was negative with no nystagmus observed and patient denied vertigo.   Note: After canalith repositioning maneuver, perform neuromuscular reeducation.   Subjective Assessment - 08/14/21 1020     Subjective Pt reports that night before last she had some dizziness without vertigo.  Patient reports compliance with home exercise program.  Patient reports that she continues to have some veering with ambulation.    Pertinent History Fall on Nov 26th, climbing stairs to go into kitchen, lost balance and fell backwards,  Hit buttocks first and then back to back of head.  - LOC.  Became dizzy just trying to get up, husband contacted EMS.  Had CT scan without any acute abnormalities.  Had a HA for a few days which has resolved.  Dizziness has persisted since the fall, mostly happens from supine > sit and when on R side.  Vision is a little blurry but is due for eye exam next month.  Denies neck pain, nausea  or vomiting, has a history of tinnitus, denies changes in hearing; cataract, depression, glaucoma, IBS, migraine HA, mitral valve prolapse, essential tremor hands, osteopenia, palpitations, scoliosis, venous insufficiency, scoliosis back surgery, rotator cuff repair    Patient Stated Goals Patient would like to be able to walk without veering           Neuromuscular Re-education:  Bounce Passes: Patient performed ambulation 150' trials while doing alternating sides bounce passes to self with ball while tracking ball with eyes and head with contact-guard assistance.   Ball toss over shoulder: Patient performed 150' trial of forward and retro ambulation while tossing ball over one shoulder with return  catch over opposite shoulder with CGA.  Patient performed multiple 150' trials of forward and retro ambulation while tossing ball over one shoulder with return catch over opposite shoulder varying the ball position to head, shoulder and waist level to promote head turning and tilting with contact-guard assistance.  Heel toe ambulation:  Patient performed heel toe ambulation about 52' with contact-guard assistance.  Patient touched wall for balance a few times and had a few crossover steps as well as 1 step out to maintain balance with this activity.  Eyes closed ambulation: Patient performed about 100 feet of forward ambulation with eyes closed with contact-guard assistance.  Patient demonstrated mild veering with no overt losses of balance.  Quick Turns:  Patient performed ambulation with random quick turns as called out by therapist with continued walking.  Patient did well with this activity with no losses of balance. Patient performed 8 reps of walking 10' with alternating quick turns left and right with cueing to perform quickly.  Patient had a few small step outs/episodes of mild imbalance noted with first few quick turns but then improved with practice.  Patient reports quired contact-guard assistance with this activity.    PT Education - 08/14/21 [phone]     Education Details discussed HEP, Dix-Hallpike and canalith repositioning maneuver; exercise technique throughout session    Person(s) Educated Patient    Methods Explanation    Comprehension Verbalized understanding              PT Short Term Goals - 07/23/21 1201       PT SHORT TERM GOAL #1   Title = LTG               PT Long Term Goals - 07/23/21 1154       PT LONG TERM GOAL #1   Title Pt will demonstrate negative positional testing on R and L side    Baseline bilateral vs. multi-canal BPPV    Time 4    Period Weeks    Status New    Target Date 08/22/21      PT LONG TERM GOAL #2   Title Pt will report no  dizziness at home when performing rolling to R side and performing supine <> sit    Time 4    Period Weeks    Status New    Target Date 08/22/21      PT LONG TERM GOAL #3   Title Standing balance goal as indicated.    Time 4    Period Weeks    Status New    Target Date 08/22/21                   Plan - 08/14/21 1021     Clinical Impression Statement Patient reports that she had some dizziness night before last and repeated  Dix-Hallpike tests.  Noted 3 beats of nystagmus with right Dix-Hallpike test and therefore performed canalith repositioning for right ear this date.  Patient reports that she continues to have veering when walking and worked on ambulation with head turns and quick turns this date.  Patient had one episode of retropulsion after ambulation trials and had some mild veering at times with ambulation activities this date, but improved as compared to last session as she had decreased retropulsion and fewer episodes of veering.  Next session will plan on reviewing single-leg stance activities as patient reports that these continue to be challenging, ambulation with head turns and body turns and will repeat Dix-Hallpike tests if indicated.  Patient would benefit from continued vestibular PT services to further address goals.    Personal Factors and Comorbidities Comorbidity 3+;Past/Current Experience    Comorbidities cataract, depression, glaucoma, IBS, migraine HA, mitral valve prolapse, essential tremor hands, osteopenia, palpitations, scoliosis, venous insufficiency, scoliosis back surgery, rotator cuff repair, fall/concussion, peripheral neuropathy    Examination-Activity Limitations Bed Mobility    Stability/Clinical Decision Making Stable/Uncomplicated    Rehab Potential Good    PT Frequency 1x / week    PT Duration 6 weeks    PT Treatment/Interventions ADLs/Self Care Home Management;Canalith Repostioning;Therapeutic activities;Therapeutic exercise;Balance  training;Neuromuscular re-education;Patient/family education;Vestibular    PT Next Visit Plan recheck for R BPPV-treat as indicated.  Also check for L once R cleared - I think she may have bilateral.  Screen for any significant balance impairments - pt reports having premorbid balance issues due to peripheral neuropathy.    Consulted and Agree with Plan of Care Patient             Patient will benefit from skilled therapeutic intervention in order to improve the following deficits and impairments:  Dizziness  Visit Diagnosis: Dizziness and giddiness     Problem List Patient Active Problem List   Diagnosis Date Noted   Benign paroxysmal positional vertigo of right ear 07/02/2021   Orthostatic dizziness 07/02/2021   Post concussion syndrome 05/15/2021   Fall 05/15/2021   Upper respiratory tract infection 04/29/2021   Peripheral neuropathy 03/12/2021   Chronic left hip pain 03/05/2020   Mixed hyperlipidemia 09/18/2019   Chronic kidney disease, stage 3a ([place]) 09/18/2019   Obstructive sleep apnea 07/05/2019   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/2019   Osteopenia 02/08/2019   Open-angle glaucoma of both eyes, mild stage 02/07/2019   Essential tremor 05/20/2016   Depression 12/25/2015   BCC (basal cell carcinoma of skin) 07/29/2015   Reflex sympathetic dystrophy of lower extremity 06/05/2014   Migraine headache 10/10/2007   GLAUCOMA 10/10/2007   MITRAL VALVE PROLAPSE 10/10/2007   Venous (peripheral) insufficiency 10/10/2007   Diverticulosis of large intestine 10/10/2007   IRRITABLE BOWEL SYNDROME 10/10/2007   Disorder of bone and cartilage 10/10/2007   SCOLIOSIS 10/10/2007   PAC (premature atrial contraction) 10/10/2007   Lady Deutscher PT, DPT #[id number] Lady Deutscher, PT 08/14/2021, 10:25 AM  [place] MAIN [facility] SERVICES [address] [place], [place], [address] Phone: [phone]   Fax:  [phone]  Name: Dana Massey MRN:  [id number] Date of Birth: Aug 02, 1945

## 2021-08-19 ENCOUNTER — Other Ambulatory Visit: Payer: Self-pay

## 2021-08-19 ENCOUNTER — Encounter: Payer: Self-pay | Admitting: Physical Therapy

## 2021-08-19 ENCOUNTER — Ambulatory Visit: Payer: Medicare HMO | Admitting: Physical Therapy

## 2021-08-19 DIAGNOSIS — R42 Dizziness and giddiness: Secondary | ICD-10-CM | POA: Diagnosis not present

## 2021-08-19 DIAGNOSIS — H8113 Benign paroxysmal vertigo, bilateral: Secondary | ICD-10-CM | POA: Diagnosis not present

## 2021-08-19 NOTE — Therapy (Signed)
[place] [facility] SERVICES [address] [place], [place], [address] Phone: [phone]   Fax:  [phone]  Physical Therapy Treatment  Patient Details  Name: Dana Massey MRN: [id number] Date of Birth: 1946/04/11 Referring Provider (PT): Michela Pitcher, NP   Encounter Date: 08/19/2021   PT End of Session - 08/19/21 0847     Visit Number 5    Number of Visits 7    Date for PT Re-Evaluation 09/05/21    PT Start Time 0846    PT stop time 0926   PT time calculation (min) 40   Equipment Utilized During Treatment Gait belt    Activity Tolerance Patient tolerated treatment well    Behavior During Therapy WFL for tasks assessed/performed             Past Medical History:  Diagnosis Date   Cataract    left eye surgery to remove, right eye just watching currently   Constipation    Depression    Diverticulosis of colon    Glaucoma    ocular htn bilateral eyes - uses drops    History of blood transfusion 1994   with spinal fusion surgery   IBS (irritable bowel syndrome)    Migraine headache    resolved, no current problem   Mitral valve prolapse    patient denies this dx   Neuromuscular disorder ([place])    essential tremor hands   Osteopenia    PAC (premature atrial contraction)    on metoprolol   Palpitations    Scoliosis    Shoulder pain    resolved, no loonger a problem   Toxic effect of venom(989.5)    Venous insufficiency     Past Surgical History:  Procedure Laterality Date   BACK SURGERY  1994   scoliosis at [facility] spine center   CATARACT EXTRACTION Left 04/2010   COLONOSCOPY  2009   [place]- Normal   ROTATOR CUFF REPAIR  2006   WISDOM TOOTH EXTRACTION      There were no vitals filed for this visit.   Subjective Assessment - 08/19/21 0849     Subjective Pt reports that she did not have any episodes of dizziness this past week. Pt states she is getting better at the slow marching exercise, but that heel toe  is still difficult.    Pertinent History Fall on Nov 26th, climbing stairs to go into kitchen, lost balance and fell backwards,  Hit buttocks first and then back to back of head.  - LOC.  Became dizzy just trying to get up, husband contacted EMS.  Had CT scan without any acute abnormalities.  Had a HA for a few days which has resolved.  Dizziness has persisted since the fall, mostly happens from supine > sit and when on R side.  Vision is a little blurry but is due for eye exam next month.  Denies neck pain, nausea or vomiting, has a history of tinnitus, denies changes in hearing; cataract, depression, glaucoma, IBS, migraine HA, mitral valve prolapse, essential tremor hands, osteopenia, palpitations, scoliosis, venous insufficiency, scoliosis back surgery, rotator cuff repair    Patient Stated Goals Patient would like to be able to walk without veering              Neuromuscular Re-education:  Dix-Hallpike testing: On inverted mat table performed left and right Dix-Hallpike testing.  Both tests were negative with no nystagmus observed and patient denied vertigo and dizziness symptoms. Patient reports  that she has had no further episodes of dizziness since treatment last week.  Step Taps: On firm surface, patient performed alternating step taps on 6" wooden step. Repeated activity while standing on Airex pad. Initially patient had a few small losses of balance that she was able to self-correct.  When standing on Airex pad performing step taps, patient had 1 loss of balance where she needed to hold ballet bar for support. Patient required contact-guard assistance with above activities.   Cone tapping: On firm surface, patient performed foot tapping to cones in series of one, two and then three targets as called out by therapist.  In standing on Airex pad, patient performed foot tapping to cones in series of one and two cones as called out by therapist with CGA.  Patient had difficulty with  standing on right leg while crossing body to tap cones to the left side, but did improve some with practice. Patient occasionally touching // bar for support due to losses of balance, but was primarily able to use hip and ankle strategies to regain balance. Patient requiring assistance ranging from CGA to Minimal A at times due to loss of balance.   Slow Marching: Patient performed a few repetitions of slow marching with 5-second holds on firm surface and then progressed to standing on Airex pad slow marching with 5-second holds without upper extremity support. Added this progression to home exercise program of standing on pillow while performing slow marching.  Heel toe and braided walking: Patient performed approximately 100 feet of heel toe walking with mild unsteadiness noted. Patient performed braided walking to the right approximately 75 feet and then to the left approximately 75 feet with standby assistance. Noted that patient had more difficulty when standing on right leg while braiding with the left leg. Patient had a few episodes where she had a small loss of balance and would need to take a step to recover.  Ambulation with head turns:  Patient performed 200 feet trials of forward and retroambulation with self-selected random head turns while performing dual naming task as well as performing random quick turns has called out by therapist. Patient required contact-guard assistance with this activity.  Patient did well with this activity and had no overt losses of balance or episodes of retropulsion when stopping. Patient then performed 200' forward ambulation with eyes closed trials and then added forward ambulation with eyes closed with vertical head turns and then horizontal head turns. Patient did demonstrate veering with ambulation with eyes closed especially when performing horizontal head turns.  Patient had no episodes of retropulsion when stopping after this activity as well.    Patient reports that she had no further episodes of dizziness this past week.  Repeated right and left Dix-Hallpike testing and both were negative with no nystagmus observed and patient denied vertigo and dizziness symptoms.  Patient was challenged by alternate step tapping and cone tapping activities and will plan on repeating next session.  Patient demonstrated improvement and slow marching and was able to progress to standing on compliant surface, and added this progression to home exercise program.  Patient did well with ambulation with head turns while performing dual naming tasking and when adding random quick turns as called out by therapist with no losses of balance this date.  Patient would benefit from continued vestibular PT services to further address goals and balance deficits.    PT Education - 08/19/21 0847     Education Details Discussed home exercise program and added  progression of standing on pillow while performing slow marching; exercise technique throughout session   Person(s) Educated Patient    Methods Explanation    Comprehension Verbalized understanding              PT Short Term Goals - 07/23/21 1201       PT SHORT TERM GOAL #1   Title = LTG               PT Long Term Goals - 07/23/21 1154       PT LONG TERM GOAL #1   Title Pt will demonstrate negative positional testing on R and L side    Baseline bilateral vs. multi-canal BPPV    Time 4    Period Weeks    Status New    Target Date 08/22/21      PT LONG TERM GOAL #2   Title Pt will report no dizziness at home when performing rolling to R side and performing supine <> sit    Time 4    Period Weeks    Status New    Target Date 08/22/21      PT LONG TERM GOAL #3   Title Standing balance goal as indicated.    Time 4    Period Weeks    Status New    Target Date 08/22/21                 Plan - 08/20/21 1730     Clinical Impression Statement Patient reports that she had no  further episodes of dizziness this past week.  Repeated right and left Dix-Hallpike testing and both were negative with no nystagmus observed and patient denied vertigo and dizziness symptoms.  Patient was challenged by alternate step tapping and cone tapping activities and will plan on repeating next session.  Patient demonstrated improvement and slow marching and was able to progress to standing on compliant surface, and added this progression to home exercise program.  Patient did well with ambulation with head turns while performing dual naming tasking and when adding random quick turns as called out by therapist with no losses of balance this date.  Patient would benefit from continued vestibular PT services to further address goals and balance deficits.    Personal Factors and Comorbidities Comorbidity 3+;Past/Current Experience    Comorbidities cataract, depression, glaucoma, IBS, migraine HA, mitral valve prolapse, essential tremor hands, osteopenia, palpitations, scoliosis, venous insufficiency, scoliosis back surgery, rotator cuff repair, fall/concussion, peripheral neuropathy    Examination-Activity Limitations Bed Mobility    Stability/Clinical Decision Making Stable/Uncomplicated    Rehab Potential Good    PT Frequency 1x / week    PT Duration 6 weeks    PT Treatment/Interventions ADLs/Self Care Home Management;Canalith Repostioning;Therapeutic activities;Therapeutic exercise;Balance training;Neuromuscular re-education;Patient/family education;Vestibular    PT Next Visit Plan recheck for R BPPV-treat as indicated.  Also check for L once R cleared - I think she may have bilateral.  Screen for any significant balance impairments - pt reports having premorbid balance issues due to peripheral neuropathy.    Consulted and Agree with Plan of Care Patient                 Patient will benefit from skilled therapeutic intervention in order to improve the following deficits and impairments:      Visit Diagnosis: Dizziness and giddiness     Problem List Patient Active Problem List   Diagnosis Date Noted   Benign paroxysmal positional vertigo of right ear 07/02/2021  Orthostatic dizziness 07/02/2021   Post concussion syndrome 05/15/2021   Fall 05/15/2021   Upper respiratory tract infection 04/29/2021   Peripheral neuropathy 03/12/2021   Chronic left hip pain 03/05/2020   Mixed hyperlipidemia 09/18/2019   Chronic kidney disease, stage 3a ([place]) 09/18/2019   Obstructive sleep apnea 07/05/2019   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/2019   Osteopenia 02/08/2019   Open-angle glaucoma of both eyes, mild stage 02/07/2019   Essential tremor 05/20/2016   Depression 12/25/2015   BCC (basal cell carcinoma of skin) 07/29/2015   Reflex sympathetic dystrophy of lower extremity 06/05/2014   Migraine headache 10/10/2007   GLAUCOMA 10/10/2007   MITRAL VALVE PROLAPSE 10/10/2007   Venous (peripheral) insufficiency 10/10/2007   Diverticulosis of large intestine 10/10/2007   IRRITABLE BOWEL SYNDROME 10/10/2007   Disorder of bone and cartilage 10/10/2007   SCOLIOSIS 10/10/2007   PAC (premature atrial contraction) 10/10/2007   Lady Deutscher PT, DPT #[id number] Lady Deutscher, PT 08/19/2021, 9:10 AM  [place] [facility] SERVICES [address] [place], [place], [address] Phone: [phone]   Fax:  [phone]  Name: Dana Massey MRN: [id number] Date of Birth: Dec 20, 1945

## 2021-08-21 DIAGNOSIS — H40053 Ocular hypertension, bilateral: Secondary | ICD-10-CM | POA: Diagnosis not present

## 2021-08-26 ENCOUNTER — Other Ambulatory Visit: Payer: Self-pay

## 2021-08-26 ENCOUNTER — Encounter: Payer: Self-pay | Admitting: Physical Therapy

## 2021-08-26 ENCOUNTER — Ambulatory Visit: Payer: Medicare HMO | Admitting: Physical Therapy

## 2021-08-26 DIAGNOSIS — R42 Dizziness and giddiness: Secondary | ICD-10-CM

## 2021-08-26 DIAGNOSIS — H8113 Benign paroxysmal vertigo, bilateral: Secondary | ICD-10-CM | POA: Diagnosis not present

## 2021-08-26 NOTE — Therapy (Signed)
[place] [facility] [facility] ?[place], [place], [address] ?Phone: [phone]   Fax:  [phone] ? ?Physical Therapy Treatment ? ?Patient Details  ?Name: Dana Massey ?MRN: [id number] ?Date of Birth: 1946-05-10 ?Referring Provider (PT): Michela Pitcher, NP ? ? ?Encounter Date: 08/26/2021 ? ? PT End of Session - 08/26/21 2706   ? ? Visit Number 6   ? Number of Visits 7   ? Date for PT Re-Evaluation 09/05/21   ? PT Start Time [phone]   ? PT stop time 0856  ? PT time calculation (min) 44 min  ? Equipment Utilized During Treatment Gait belt   ? Activity Tolerance Patient tolerated treatment well   ? Behavior During Therapy [facility] for tasks assessed/performed   ? ?  ?  ? ?  ? ? ?Past Medical History:  ?Diagnosis Date  ? Cataract   ? left eye surgery to remove, right eye just watching currently  ? Constipation   ? Depression   ? Diverticulosis of colon   ? Glaucoma   ? ocular htn bilateral eyes - uses drops   ? History of blood transfusion 1994  ? with spinal fusion surgery  ? IBS (irritable bowel syndrome)   ? Migraine headache   ? resolved, no current problem  ? Mitral valve prolapse   ? patient denies this dx  ? Neuromuscular disorder ([place])   ? essential tremor hands  ? Osteopenia   ? PAC (premature atrial contraction)   ? on metoprolol  ? Palpitations   ? Scoliosis   ? Shoulder pain   ? resolved, no loonger a problem  ? Toxic effect of venom(989.5)   ? Venous insufficiency   ? ? ?Past Surgical History:  ?Procedure Laterality Date  ? BACK SURGERY  1994  ? scoliosis at [facility] spine center  ? CATARACT EXTRACTION Left 04/2010  ? COLONOSCOPY  2009  ? [place] [facility]- Normal  ? ROTATOR CUFF REPAIR  2006  ? WISDOM TOOTH EXTRACTION    ? ? ?There were no vitals filed for this visit. ? ? Subjective Assessment - 08/26/21 0816   ? ? Subjective Pt reports that she did not have any episodes of dizziness again this past week. Pt reports she did slow marching on the pillow and states she did okay  with that and that she tried using soup cans to do step tapping. Pt states she still has difficulty balancing on her right leg. Pt states she feels like her neuropathy is limiting.   ? Pertinent History Fall on Nov 26th, climbing stairs to go into kitchen, lost balance and fell backwards,  Hit buttocks first and then back to back of head.  - LOC.  Became dizzy just trying to get up, husband contacted EMS.  Had CT scan without any acute abnormalities.  Had a HA for a few days which has resolved.  Dizziness has persisted since the fall, mostly happens from supine > sit and when on R side.  Vision is a little blurry but is due for eye exam next month.  Denies neck pain, nausea or vomiting, has a history of tinnitus, denies changes in hearing; cataract, depression, glaucoma, IBS, migraine HA, mitral valve prolapse, essential tremor hands, osteopenia, palpitations, scoliosis, venous insufficiency, scoliosis back surgery, rotator cuff repair   ? Diagnostic tests CT scan   ? Patient Stated Goals Patient would like to be able to walk without veering   ? ?  ?  ? ?  ? ? ?  Neuromuscular Re-education: ? ?Step Taps: ?On firm surface, patient performed alternating step taps on 6" wooden step. Repeated activity while standing on Airex pad. ? ?Step Ups: ?Patient performed step ups on 6" wooden box multiple reps without upper extremity support with contact-guard assistance.  ? ?Mountain climber's: ?On firm surface patient performed mountain climber's with and without head turns horizontal. ?On Airex pad, patient performed mountain climber's with and without head turns horizontal with contact-guard assistance. ?Patient's balance was challenged by this activity. ? ?Slow Marching: ?Patient performed on standing on Airex pad, slow marching with 5-second holds 10 repetitions each leg x2 sets with contact-guard assistance. ? ?Tandem walking: ?Patient performed tandem walking 15' times 2reps.  ?Pt did well with stepping in straight line  without veering or step outs. ? ?Red floor mat Activity: ?Worked on red floor foam mat kicking soccer ball against wall with alternating kicking foot.  ?Patient was able to perform with CGA. Pt did well with this activity without loss of balance.  ?Standing on red foam floor mat patient performed cone tapping in series of 2 and 3 cones as called out by therapist with contact-guard assistance. ? ?Note: Portions of this document were prepared using Dragon voice recognition software and although reviewed may contain unintentional dictation errors in syntax, grammar, or spelling. ? ? ? ? ? ? ? ? PT Short Term Goals - 07/23/21 1201   ? ?  ? PT SHORT TERM GOAL #1  ? Title = LTG   ? ?  ?  ? ?  ? ? ? ? PT Long Term Goals - 07/23/21 1154   ? ?  ? PT LONG TERM GOAL #1  ? Title Pt will demonstrate negative positional testing on R and L side   ? Baseline bilateral vs. multi-canal BPPV   ? Time 4   ? Period Weeks   ? Status New   ? Target Date 08/22/21   ?  ? PT LONG TERM GOAL #2  ? Title Pt will report no dizziness at home when performing rolling to R side and performing supine <> sit   ? Time 4   ? Period Weeks   ? Status New   ? Target Date 08/22/21   ?  ? PT LONG TERM GOAL #3  ? Title Standing balance goal as indicated.   ? Time 4   ? Period Weeks   ? Status New   ? Target Date 08/22/21   ? ?  ?  ? ?  ? ? ? Plan - 09/05/21 1542   ? ? Clinical Impression Statement Patient did well with red floor foam mat alternate feet kicking soccer ball with no losses of balance and did well with cone tapping in series of 2 and 3 cones while standing on red mat.  Patient was challenged by mountain climber's especially when added in head turning, but improved with practice.  Patient did well with tandem walking and was able to perform without veering or step outs.  Next session will plan on retesting functional outcome measures and updating goals.  Anticipate discharge at next session if patient continues to report improvements in her  symptoms and no further episodes of vertigo.   ? Personal Factors and Comorbidities Comorbidity 3+;Past/Current Experience   ? Comorbidities cataract, depression, glaucoma, IBS, migraine HA, mitral valve prolapse, essential tremor hands, osteopenia, palpitations, scoliosis, venous insufficiency, scoliosis back surgery, rotator cuff repair, fall/concussion, peripheral neuropathy   ? Examination-Activity Limitations Bed Mobility   ? Stability/Clinical  Decision Making Stable/Uncomplicated   ? Rehab Potential Good   ? PT Frequency 1x / week   ? PT Duration 6 weeks   ? PT Treatment/Interventions ADLs/Self Care Home Management;Canalith Repostioning;Therapeutic activities;Therapeutic exercise;Balance training;Neuromuscular re-education;Patient/family education;Vestibular   ? PT Next Visit Plan recheck for R BPPV-treat as indicated.  Also check for L once R cleared - I think she may have bilateral.  Screen for any significant balance impairments - pt reports having premorbid balance issues due to peripheral neuropathy.   ? Consulted and Agree with Plan of Care Patient   ? ?  ?  ? ?  ? ? ?Patient will benefit from skilled therapeutic intervention in order to improve the following deficits and impairments:    ? ?Visit Diagnosis: ?Dizziness and giddiness ? ?BPPV (benign paroxysmal positional vertigo), bilateral ? ? ? ? ?Problem List ?Patient Active Problem List  ? Diagnosis Date Noted  ? Benign paroxysmal positional vertigo of right ear 07/02/2021  ? Orthostatic dizziness 07/02/2021  ? Post concussion syndrome 05/15/2021  ? Fall 05/15/2021  ? Upper respiratory tract infection 04/29/2021  ? Peripheral neuropathy 03/12/2021  ? Chronic left hip pain 03/05/2020  ? Mixed hyperlipidemia 09/18/2019  ? Chronic kidney disease, stage 3a ([place]) 09/18/2019  ? Obstructive sleep apnea 07/05/2019  ? 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/2019  ? Osteopenia 02/08/2019  ? Open-angle glaucoma of both eyes, mild stage 02/07/2019  ? Essential tremor  05/20/2016  ? Depression 12/25/2015  ? BCC (basal cell carcinoma of skin) 07/29/2015  ? Reflex sympathetic dystrophy of lower extremity 06/05/2014  ? Migraine headache 10/10/2007  ? GLAUCOMA 10/10/2007  ? [facility]

## 2021-09-01 ENCOUNTER — Other Ambulatory Visit: Payer: Self-pay

## 2021-09-01 NOTE — Therapy (Addendum)
?OUTPATIENT PHYSICAL THERAPY TREATMENT NOTE/ DISCHARGE SUMMARY ?Dates of service: July 22, 2021-09/02/2021 ?Total number of visits: 7 ? ? ?Patient Name: Dana Massey ?MRN: [id number] ?DOB:Aug 09, 1945, 76 y.o., female ?Today's Date: 09/02/2021 ? ?PCP: Lesleigh Noe, MD ?REFERRING PROVIDER: Michela Pitcher, NP ? ? PT End of Session - 09/02/21 0939   ? ? Visit Number 7   ? Number of Visits 7   ? Date for PT Re-Evaluation 09/05/21   ? PT Start Time [id number]   ? PT Stop Time 1010   ? PT Time Calculation (min) 31 min   ? Equipment Utilized During Treatment Gait belt   ? Activity Tolerance Patient tolerated treatment well   ? Behavior During Therapy [facility] for tasks assessed/performed   ? ?  ?  ? ?  ? ? ?Past Medical History:  ?Diagnosis Date  ? Cataract   ? left eye surgery to remove, right eye just watching currently  ? Constipation   ? Depression   ? Diverticulosis of colon   ? Glaucoma   ? ocular htn bilateral eyes - uses drops   ? History of blood transfusion 1994  ? with spinal fusion surgery  ? IBS (irritable bowel syndrome)   ? Migraine headache   ? resolved, no current problem  ? Mitral valve prolapse   ? patient denies this dx  ? Neuromuscular disorder ([place])   ? essential tremor hands  ? Osteopenia   ? PAC (premature atrial contraction)   ? on metoprolol  ? Palpitations   ? Scoliosis   ? Shoulder pain   ? resolved, no loonger a problem  ? Toxic effect of venom(989.5)   ? Venous insufficiency   ? ?Past Surgical History:  ?Procedure Laterality Date  ? BACK SURGERY  1994  ? scoliosis at [facility] spine [facility]  ? CATARACT EXTRACTION Left 04/2010  ? COLONOSCOPY  2009  ? Henrene Pastor- Normal  ? ROTATOR CUFF REPAIR  2006  ? WISDOM TOOTH EXTRACTION    ? ?Patient Active Problem List  ? Diagnosis Date Noted  ? Benign paroxysmal positional vertigo of right ear 07/02/2021  ? Orthostatic dizziness 07/02/2021  ? Post concussion syndrome 05/15/2021  ? Fall 05/15/2021  ? Upper respiratory tract infection 04/29/2021  ? Peripheral neuropathy  03/12/2021  ? Chronic left hip pain 03/05/2020  ? Mixed hyperlipidemia 09/18/2019  ? Chronic kidney disease, stage 3a ([place]) 09/18/2019  ? Obstructive sleep apnea 07/05/2019  ? 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/2019  ? Osteopenia 02/08/2019  ? Open-angle glaucoma of both eyes, mild stage 02/07/2019  ? Essential tremor 05/20/2016  ? Depression 12/25/2015  ? BCC (basal cell carcinoma of skin) 07/29/2015  ? Reflex sympathetic dystrophy of lower extremity 06/05/2014  ? Migraine headache 10/10/2007  ? GLAUCOMA 10/10/2007  ? MITRAL VALVE PROLAPSE 10/10/2007  ? Venous (peripheral) insufficiency 10/10/2007  ? Diverticulosis of large intestine 10/10/2007  ? IRRITABLE BOWEL SYNDROME 10/10/2007  ? Disorder of bone and cartilage 10/10/2007  ? SCOLIOSIS 10/10/2007  ? PAC (premature atrial contraction) 10/10/2007  ? ? ?REFERRING DIAG: H81.11 (ICD-10-CM) - Benign paroxysmal positional vertigo of right ear  ? ?THERAPY DIAG:  ?Dizziness and giddiness ? ?PERTINENT HISTORY: Comorbidity 3+;Past/Current Experience, cataract, depression, glaucoma, IBS, migraine HA, mitral valve prolapse, essential tremor hands, osteopenia, palpitations, scoliosis, venous insufficiency, scoliosis back surgery, rotator cuff repair  ? ?PRECAUTIONS: fall ? ?SUBJECTIVE: Pt reports she has had no further episodes of dizziness since last week. Pt reports 100% improvement in dizziness and 80% on balance since initial  evaluation.  ?Pt reports she has been doing [employer] virtually on Thursdays and Fridays.  ?PAIN:  ?Are you having pain? No ? ? ?TODAY'S TREATMENT:  ?09/02/2021 ? ?FUNCTIONAL OUTCOME MEASURES: ? Results Comments  ?DGI 23/24 Normal, safe for community mobility  ? ?Clinical Test of Sensory Interaction for Balance (CTSIB): ? ?CONDITION TIME STRATEGY SWAY  ?Eyes open, firm surface 30 seconds ankle +1  ?Eyes closed, firm surface 30 seconds ankle +2  ?Eyes open, foam surface 30 seconds ankle +1  ?Eyes closed, foam surface 30 seconds Ankle, hip +2   ? ?Discussed functional outcome testing and compared to prior testing.  Discussed goals and plan of care.  Discussed discharge plans and home exercise program. ?Discussed safety precautions with performing HEP at home. Demonstrated and discussed standing in corner with chair in front for safety and then patient demonstrated. ? ? ?PATIENT EDUCATION: ?Education details: discussed home exercise program and gave vestibular PowerPoint handouts slides 1,4, 5, 7 and wrote out standing mountain climbers with head turns and step taps in series of 2 and 3 on soup cans for HEP.  ?Person educated: Patient ?Education method: Explanation, Verbal cues, and Handouts ?Education comprehension: verbalized understanding ? ? ?HOME EXERCISE PROGRAM: ?Feet together and semi-tandem stance progressions with head turns vertical and horizontal with eyes open and with eyes closed on firm and pillow standing in corner with chair in front for safety. Standing mountain climbers with head turns and step taps in series of 2 or 3 on soup cans.  ? ?Note: Portions of this document were prepared using Dragon voice recognition software and although reviewed may contain unintentional dictation errors in syntax, grammar, or spelling. ? ? PT Short Term Goals   ? ?  ? PT SHORT TERM GOAL #1  ? Title = LTG   ? ?  ?  ? ?  ? ? ? PT Long Term Goals   ? ?  ? PT LONG TERM GOAL #1  ? Title Pt will demonstrate negative positional testing on R and L side   ? Baseline bilateral vs. multi-canal BPPV ; negative bilateral Dix-Hallpike tests  ? Time 4   ? Period Weeks   ? Status Achieved  ? Target Date 08/22/21   ?  ? PT LONG TERM GOAL #2  ? Title Pt will report no dizziness at home when performing rolling to R side and performing supine <> sit   ? Baseline On 09/02/21, pt reports no dizziness with rolling and with supine to/from sitting  ? Time 4   ? Period Weeks   ? Status Achieved   ? Target Date 08/22/21   ?  ? PT LONG TERM GOAL #3  ? Title Standing balance goal as  indicated.   ? Baseline Pt scored 23/24 on DGI  ? Time 4   ? Period Weeks   ? Status Achieved  ? Target Date 08/22/21   ? ?  ?  ? ?  ? ? Plan - 09/05/21 1609   ? ? Clinical Impression Statement Patient continues to report that she has had no further episodes of vertigo and that she has doing well with her home exercise program.  Patient reports that she has been able to resume virtual Silver sneakers classes and has been doing them on Thursdays and Fridays.  Patient progressed well and met 3 out of 3 goals has set on plan of care.  In addition patient reports that she has seen 100% improvement in her dizziness symptoms and  80% improvement in her balance symptoms since initial evaluation.  Discussed and finalized home exercise program and reviewed with patient.  Discussed standing in corner with chair in front for safety with home exercise program, and patient demonstrated this back in clinic.  Patient reports no questions or concerns at this time.  Patient in agreement with discharge from physical therapy with plans to continue her home exercise program as well as the Silver sneakers program.  Will discharge patient at this time.   ? Personal Factors and Comorbidities Comorbidity 3+;Past/Current Experience   ? Comorbidities cataract, depression, glaucoma, IBS, migraine HA, mitral valve prolapse, essential tremor hands, osteopenia, palpitations, scoliosis, venous insufficiency, scoliosis back surgery, rotator cuff repair, fall/concussion, peripheral neuropathy   ? Examination-Activity Limitations Bed Mobility   ? Stability/Clinical Decision Making Stable/Uncomplicated   ? Rehab Potential Good   ? PT Frequency 1x / week   ? PT Duration 6 weeks   ? PT Treatment/Interventions ADLs/Self Care Home Management;Canalith Repostioning;Therapeutic activities;Therapeutic exercise;Balance training;Neuromuscular re-education;Patient/family education;Vestibular   ? PT Next Visit Plan recheck for R BPPV-treat as indicated.  Also  check for L once R cleared - I think she may have bilateral.  Screen for any significant balance impairments - pt reports having premorbid balance issues due to peripheral neuropathy.   ? Consulted and Agree with Pla

## 2021-09-02 ENCOUNTER — Encounter: Payer: Self-pay | Admitting: Physical Therapy

## 2021-09-02 ENCOUNTER — Other Ambulatory Visit: Payer: Self-pay

## 2021-09-02 ENCOUNTER — Ambulatory Visit: Payer: Medicare HMO | Admitting: Physical Therapy

## 2021-09-02 DIAGNOSIS — R42 Dizziness and giddiness: Secondary | ICD-10-CM

## 2021-09-02 DIAGNOSIS — H8113 Benign paroxysmal vertigo, bilateral: Secondary | ICD-10-CM | POA: Diagnosis not present

## 2021-09-05 ENCOUNTER — Other Ambulatory Visit: Payer: Self-pay

## 2021-09-09 ENCOUNTER — Encounter: Payer: Medicare HMO | Admitting: Physical Therapy

## 2021-09-16 ENCOUNTER — Encounter: Payer: Medicare HMO | Admitting: Physical Therapy

## 2021-09-23 ENCOUNTER — Encounter: Payer: Medicare HMO | Admitting: Physical Therapy

## 2021-09-30 ENCOUNTER — Encounter: Payer: Medicare HMO | Admitting: Physical Therapy

## 2021-10-07 ENCOUNTER — Encounter: Payer: Medicare HMO | Admitting: Physical Therapy

## 2021-10-14 ENCOUNTER — Encounter: Payer: Medicare HMO | Admitting: Physical Therapy

## 2021-10-20 DIAGNOSIS — M533 Sacrococcygeal disorders, not elsewhere classified: Secondary | ICD-10-CM | POA: Diagnosis not present

## 2021-10-20 DIAGNOSIS — R251 Tremor, unspecified: Secondary | ICD-10-CM | POA: Diagnosis not present

## 2021-10-20 DIAGNOSIS — G43019 Migraine without aura, intractable, without status migrainosus: Secondary | ICD-10-CM | POA: Diagnosis not present

## 2021-10-20 DIAGNOSIS — R2689 Other abnormalities of gait and mobility: Secondary | ICD-10-CM | POA: Diagnosis not present

## 2021-10-20 DIAGNOSIS — G629 Polyneuropathy, unspecified: Secondary | ICD-10-CM | POA: Diagnosis not present

## 2021-10-20 DIAGNOSIS — R202 Paresthesia of skin: Secondary | ICD-10-CM | POA: Diagnosis not present

## 2021-10-20 DIAGNOSIS — R609 Edema, unspecified: Secondary | ICD-10-CM | POA: Diagnosis not present

## 2021-10-20 DIAGNOSIS — I872 Venous insufficiency (chronic) (peripheral): Secondary | ICD-10-CM | POA: Diagnosis not present

## 2021-10-21 ENCOUNTER — Encounter: Payer: Medicare HMO | Admitting: Physical Therapy

## 2021-10-28 ENCOUNTER — Encounter: Payer: Medicare HMO | Admitting: Physical Therapy

## 2021-10-31 ENCOUNTER — Ambulatory Visit (INDEPENDENT_AMBULATORY_CARE_PROVIDER_SITE_OTHER): Payer: Medicare HMO | Admitting: Vascular Surgery

## 2021-10-31 ENCOUNTER — Encounter (INDEPENDENT_AMBULATORY_CARE_PROVIDER_SITE_OTHER): Payer: Self-pay | Admitting: Vascular Surgery

## 2021-10-31 VITALS — BP 124/76 | HR 73 | Resp 17 | Ht 64.0 in | Wt 142.0 lb

## 2021-10-31 DIAGNOSIS — G6289 Other specified polyneuropathies: Secondary | ICD-10-CM | POA: Diagnosis not present

## 2021-10-31 DIAGNOSIS — M7989 Other specified soft tissue disorders: Secondary | ICD-10-CM

## 2021-10-31 DIAGNOSIS — N1831 Chronic kidney disease, stage 3a: Secondary | ICD-10-CM | POA: Diagnosis not present

## 2021-10-31 NOTE — Patient Instructions (Signed)
Edema  Edema is an abnormal buildup of fluids in the body tissues and under the skin. Swelling of the legs, feet, and ankles is a common symptom that becomes more likely as you get older. Swelling is also common in looser tissues, such as around the eyes. Pressing on the area may make a temporary dent in your skin (pitting edema). This fluid may also accumulate in your lungs (pulmonary edema). There are many possible causes of edema. Eating too much salt (sodium) and being on your feet or sitting for a long time can cause edema in your legs, feet, and ankles. Common causes of edema include: Certain medical conditions, such as heart failure, liver or kidney disease, and cancer. Weak leg blood vessels. An injury. Pregnancy. Medicines. Being obese. Low protein levels in the blood. Hot weather may make edema worse. Edema is usually painless. Your skin may look swollen or shiny. Follow these instructions at home: Medicines Take over-the-counter and prescription medicines only as told by your health care provider. Your health care provider may prescribe a medicine to help your body get rid of extra water (diuretic). Take this medicine if you are told to take it. Eating and drinking Eat a low-salt (low-sodium) diet to reduce fluid as told by your health care provider. Sometimes, eating less salt may reduce swelling. Depending on the cause of your swelling, you may need to limit how much fluid you drink (fluid restriction). General instructions Raise (elevate) the injured area above the level of your heart while you are sitting or lying down. Do not sit still or stand for long periods of time. Do not wear tight clothing. Do not wear garters on your upper legs. Exercise your legs to get your circulation going. This helps to move the fluid back into your blood vessels, and it may help the swelling go down. Wear compression stockings as told by your health care provider. These stockings help to prevent  blood clots and reduce swelling in your legs. It is important that these are the correct size. These stockings should be prescribed by your health care provider to prevent possible injuries. If elastic bandages or wraps are recommended, use them as told by your health care provider. Contact a health care provider if: Your edema does not get better with treatment. You have heart, liver, or kidney disease and have symptoms of edema. You have sudden and unexplained weight gain. Get help right away if: You develop shortness of breath or chest pain. You cannot breathe when you lie down. You develop pain, redness, or warmth in the swollen areas. You have heart, liver, or kidney disease and suddenly get edema. You have a fever and your symptoms suddenly get worse. These symptoms may be an emergency. Get help right away. Call 911. Do not wait to see if the symptoms will go away. Do not drive yourself to the hospital. Summary Edema is an abnormal buildup of fluids in the body tissues and under the skin. Eating too much salt (sodium)and being on your feet or sitting for a long time can cause edema in your legs, feet, and ankles. Raise (elevate) the injured area above the level of your heart while you are sitting or lying down. Follow your health care provider's instructions about diet and how much fluid you can drink. This information is not intended to replace advice given to you by your health care provider. Make sure you discuss any questions you have with your health care provider. Document Revised: 02/03/2021 Document   Reviewed: 02/03/2021 Elsevier Patient Education  2023 Elsevier Inc.  

## 2021-10-31 NOTE — Assessment & Plan Note (Signed)

## 2021-10-31 NOTE — Assessment & Plan Note (Signed)
Can exacerbate LE swelling.

## 2021-10-31 NOTE — Assessment & Plan Note (Signed)
Can worsen LE swelling and symptoms.

## 2021-10-31 NOTE — Progress Notes (Signed)
Patient ID: Dana Massey, female   DOB: [birth year], 76 y.o.   MRN: [id number]  Chief Complaint  Patient presents with   Establish Care    Referred by Dr Manuella Ghazi    HPI Dana Massey is a 76 y.o. female.  I am asked to see the patient by Dr. Joselyn Arrow for evaluation of left leg swelling.  She reports this has been going on for years and even decades at this point.  She first noticed it after a major spinal fusion in 1994.  It has always been the left leg.  The right leg really does not swell.  She does not have a known history of DVT or superficial thrombophlebitis to her knowledge.  She does have peripheral neuropathy on the right.  She denies any open ulceration, weeping, or infection.  No fevers or chills.  The leg is heavy and by the end of the day she says it is very noticeable how much more swelling there is and how tired the leg gets.  She does not describe it as overt pain.  She has previously had a negative DVT study.  She has been wearing compression socks without significant improvement throughout the winter and early spring.  She does have some pain up in her low back and hips and does have a previous history of spinal surgery.     Past Medical History:  Diagnosis Date   Cataract    left eye surgery to remove, right eye just watching currently   Constipation    Depression    Diverticulosis of colon    Glaucoma    ocular htn bilateral eyes - uses drops    History of blood transfusion 1994   with spinal fusion surgery   IBS (irritable bowel syndrome)    Migraine headache    resolved, no current problem   Mitral valve prolapse    patient denies this dx   Neuromuscular disorder ([place])    essential tremor hands   Osteopenia    PAC (premature atrial contraction)    on metoprolol   Palpitations    Scoliosis    Shoulder pain    resolved, no loonger a problem   Toxic effect of venom(989.5)    Venous insufficiency     Past Surgical History:  Procedure Laterality Date    BACK SURGERY  1994   scoliosis at [facility] spine center   CATARACT EXTRACTION Left 04/2010   COLONOSCOPY  2009   Henrene Pastor- Normal   ROTATOR CUFF REPAIR  2006   WISDOM TOOTH EXTRACTION       Family History  Problem Relation Age of Onset   Diabetes Mother    Stroke Paternal Grandmother 16   Colon cancer Neg Hx    Rectal cancer Neg Hx    Stomach cancer Neg Hx    Esophageal cancer Neg Hx       Social History   Tobacco Use   Smoking status: Never   Smokeless tobacco: Never  Vaping Use   Vaping Use: Never used  Substance Use Topics   Alcohol use: Never    Comment: rarely - holiday   Drug use: No     Allergies  Allergen Reactions   Wasp Venom Anaphylaxis    Has EpiPen Has EpiPen    Current Outpatient Medications  Medication Sig Dispense Refill   Acetaminophen 500 MG coapsule      aspirin EC 81 MG tablet Take 81 mg by mouth daily.  atorvastatin (LIPITOR) 10 MG tablet TAKE 1 TABLET EVERY DAY (NEEDS PHYSICAL IN SEPTEMBER) 90 tablet 3   Calcium-Vitamin D-Vitamin K 500-100-40 MG-UNT-MCG CHEW Chew 1 capsule by mouth daily.       cycloSPORINE (RESTASIS) 0.05 % ophthalmic emulsion Place 1 drop into both eyes 2 (two) times daily.       dorzolamide (TRUSOPT) 2 % ophthalmic solution 1 drop 2 (two) times daily.     fish oil-omega-3 fatty acids 1000 MG capsule Take 2 g by mouth daily.       gabapentin (NEURONTIN) 300 MG capsule Take 300 mg by mouth 2 (two) times daily.     latanoprost (XALATAN) 0.005 % ophthalmic solution Place 1 drop into both eyes at bedtime.     MELATONIN PO Take 6 mg by mouth at bedtime as needed.      metoprolol succinate (TOPROL-XL) 25 MG 24 hr tablet TAKE 1 TABLET DAILY WITH OR IMMEDIATELY FOLLOWING A MEAL. 90 tablet 3   Multiple Vitamins-Minerals (MULTIVITAMIN WITH MINERALS) tablet Take 1 tablet by mouth daily.       polyethylene glycol powder (GLYCOLAX/MIRALAX) 17 GM/SCOOP powder Take 17 g by mouth daily as needed.      sertraline (ZOLOFT) 50 MG tablet  TAKE 1 AND 1/2 TABLETS EVERY DAY 135 tablet 3   Zoster Vaccine Adjuvanted [facility]) injection Inject into the muscle. 1 each 1   No current facility-administered medications for this visit.      REVIEW OF SYSTEMS (Negative unless checked)  Constitutional: '[]'$ Weight loss  '[]'$ Fever  '[]'$ Chills Cardiac: '[]'$ Chest pain   '[]'$ Chest pressure   '[]'$ Palpitations   '[]'$ Shortness of breath when laying flat   '[]'$ Shortness of breath at rest   '[]'$ Shortness of breath with exertion. Vascular:  '[]'$ Pain in legs with walking   '[]'$ Pain in legs at rest   '[]'$ Pain in legs when laying flat   '[]'$ Claudication   '[]'$ Pain in feet when walking  '[]'$ Pain in feet at rest  '[]'$ Pain in feet when laying flat   '[]'$ History of DVT   '[]'$ Phlebitis   '[x]'$ Swelling in legs   '[]'$ Varicose veins   '[]'$ Non-healing ulcers Pulmonary:   '[]'$ Uses home oxygen   '[]'$ Productive cough   '[]'$ Hemoptysis   '[]'$ Wheeze  '[]'$ COPD   '[]'$ Asthma Neurologic:  '[]'$ Dizziness  '[]'$ Blackouts   '[]'$ Seizures   '[]'$ History of stroke   '[]'$ History of TIA  '[]'$ Aphasia   '[]'$ Temporary blindness   '[]'$ Dysphagia   '[]'$ Weakness or numbness in arms   '[]'$ Weakness or numbness in legs Musculoskeletal:  '[]'$ Arthritis   '[]'$ Joint swelling   '[x]'$ Joint pain   '[x]'$ Low back pain Hematologic:  '[]'$ Easy bruising  '[]'$ Easy bleeding   '[]'$ Hypercoagulable state   '[]'$ Anemic  '[]'$ Hepatitis Gastrointestinal:  '[]'$ Blood in stool   '[]'$ Vomiting blood  '[]'$ Gastroesophageal reflux/heartburn   '[]'$ Abdominal pain Genitourinary:  '[]'$ Chronic kidney disease   '[]'$ Difficult urination  '[]'$ Frequent urination  '[]'$ Burning with urination   '[]'$ Hematuria Skin:  '[]'$ Rashes   '[]'$ Ulcers   '[]'$ Wounds Psychological:  '[]'$ History of anxiety   '[x]'$  History of major depression.    Physical Exam BP 124/76 (BP Location: Left Arm)   Pulse 73   Resp 17   Ht '5\' 4"'$  (1.626 m)   Wt 142 lb (64.4 kg)   LMP 09/13/2000   BMI 24.37 kg/m  Gen:  WD/WN, NAD. Appears younger than stated age. Head: [place]/AT, No temporalis wasting.  Ear/Nose/Throat: Hearing grossly intact, nares w/o erythema or drainage, oropharynx  w/o Erythema/Exudate Eyes: Conjunctiva clear, sclera non-icteric  Neck: trachea midline.  No JVD.  Pulmonary:  Good  air movement, respirations not labored, no use of accessory muscles  Cardiac: RRR, no JVD Vascular:  Vessel Right Left  Radial Palpable Palpable                          DP 2+ 1+  PT 2+ NP   Gastrointestinal:. No masses, surgical incisions, or scars. Musculoskeletal: M/S 5/5 throughout.  Extremities without ischemic changes.  No deformity or atrophy. 2+ LLE edema. Neurologic: Sensation grossly intact in extremities.  Symmetrical.  Speech is fluent. Motor exam as listed above. Psychiatric: Judgment intact, Mood & affect appropriate for pt's clinical situation. Dermatologic: No rashes or ulcers noted.  No cellulitis or open wounds.    Radiology No results found.  Labs No results found for this or any previous visit (from the past 2160 hour(s)).  Assessment/Plan:  Swelling of limb Recommend:  I have had a long discussion with the patient regarding swelling and why it  causes symptoms.  Patient will begin wearing graduated compression on a daily basis a prescription was given. The patient will  wear the stockings first thing in the morning and removing them in the evening. The patient is instructed specifically not to sleep in the stockings.   In addition, behavioral modification will be initiated.  This will include frequent elevation, use of over the counter pain medications and exercise such as walking.  Consideration for a lymph pump will also be made based upon the effectiveness of conservative therapy.  This would help to improve the edema control and prevent sequela such as ulcers and infections   Patient should undergo duplex ultrasound of the venous system to ensure that DVT or reflux is not present.  The patient will follow-up with me after the ultrasound.    Chronic kidney disease, stage 3a Can exacerbate LE swelling.  Peripheral neuropathy Can  worsen LE swelling and symptoms.      Leotis Pain 10/31/2021, 10:57 AM   This note was created with Dragon medical transcription system.  Any errors from dictation are unintentional.

## 2021-11-03 ENCOUNTER — Encounter: Payer: Self-pay | Admitting: Internal Medicine

## 2021-11-03 ENCOUNTER — Ambulatory Visit: Payer: Medicare HMO | Admitting: Internal Medicine

## 2021-11-03 VITALS — BP 112/68 | HR 60 | Ht 64.0 in | Wt 142.4 lb

## 2021-11-03 DIAGNOSIS — G4733 Obstructive sleep apnea (adult) (pediatric): Secondary | ICD-10-CM | POA: Diagnosis not present

## 2021-11-03 DIAGNOSIS — R002 Palpitations: Secondary | ICD-10-CM | POA: Diagnosis not present

## 2021-11-03 NOTE — Patient Instructions (Signed)
Medication Instructions:  Your physician recommends that you continue on your current medications as directed. Please refer to the Current Medication list given to you today. *If you need a refill on your cardiac medications before your next appointment, please call your pharmacy*  Lab Work: None. If you have labs (blood work) drawn today and your tests are completely normal, you will receive your results only by: [place] (if you have MyChart) OR A paper copy in the mail If you have any lab test that is abnormal or we need to change your treatment, we will call you to review the results.  Testing/Procedures: None.  Follow-Up: At [facility], you and your health needs are our priority.  As part of our continuing mission to provide you with exceptional heart care, we have created designated Provider Care Teams.  These Care Teams include your primary Cardiologist (physician) and Advanced Practice Providers (APPs -  Physician Assistants and Nurse Practitioners) who all work together to provide you with the care you need, when you need it.  Your physician wants you to follow-up in: As needed with Cristopher Peru, MD   We recommend signing up for the patient portal called "MyChart".  Sign up information is provided on this After Visit Summary.  MyChart is used to connect with patients for Virtual Visits (Telemedicine).  Patients are able to view lab/test results, encounter notes, upcoming appointments, etc.  Non-urgent messages can be sent to your provider as well.   To learn more about what you can do with MyChart, go to [email or web address].    Any Other Special Instructions Will Be Listed Below (If Applicable).

## 2021-11-03 NOTE — Progress Notes (Signed)
HPI Dana Massey returns today for ongoing evaluation of palpitations due to atrial arrhythmias. She has been prescribed metoprolol and been taking it daily. In the interim she notes no symptomatic palpitations. She denies chest pain or sob. No syncope. No edema. She did fall and eventually recovered. She denies passing out. She has developed some peripheral neuropathy for which she is taking neurontin. Allergies  Allergen Reactions   Wasp Venom Anaphylaxis    Has EpiPen Has EpiPen     Current Outpatient Medications  Medication Sig Dispense Refill   Acetaminophen 500 MG coapsule      aspirin EC 81 MG tablet Take 81 mg by mouth daily.     atorvastatin (LIPITOR) 10 MG tablet TAKE 1 TABLET EVERY DAY (NEEDS PHYSICAL IN SEPTEMBER) 90 tablet 3   Calcium-Vitamin D-Vitamin K 500-100-40 MG-UNT-MCG CHEW Chew 1 capsule by mouth daily.       cycloSPORINE (RESTASIS) 0.05 % ophthalmic emulsion Place 1 drop into both eyes 2 (two) times daily.       dorzolamide-timolol (COSOPT) 22.3-6.8 MG/ML ophthalmic solution Place 1 drop into both eyes in the morning and at bedtime.     fish oil-omega-3 fatty acids 1000 MG capsule Take 2 g by mouth daily.       gabapentin (NEURONTIN) 300 MG capsule Take 300 mg by mouth 2 (two) times daily.     latanoprost (XALATAN) 0.005 % ophthalmic solution Place 1 drop into both eyes at bedtime.     MELATONIN PO Take 6 mg by mouth at bedtime as needed.      metoprolol succinate (TOPROL-XL) 25 MG 24 hr tablet TAKE 1 TABLET DAILY WITH OR IMMEDIATELY FOLLOWING A MEAL. 90 tablet 3   Multiple Vitamins-Minerals (MULTIVITAMIN WITH MINERALS) tablet Take 1 tablet by mouth daily.       polyethylene glycol powder (GLYCOLAX/MIRALAX) 17 GM/SCOOP powder Take 17 g by mouth daily as needed.      sertraline (ZOLOFT) 50 MG tablet TAKE 1 AND 1/2 TABLETS EVERY DAY 135 tablet 3   Zoster Vaccine Adjuvanted [facility]) injection Inject into the muscle. 1 each 1   No current  facility-administered medications for this visit.     Past Medical History:  Diagnosis Date   Cataract    left eye surgery to remove, right eye just watching currently   Constipation    Depression    Diverticulosis of colon    Glaucoma    ocular htn bilateral eyes - uses drops    History of blood transfusion 1994   with spinal fusion surgery   IBS (irritable bowel syndrome)    Migraine headache    resolved, no current problem   Mitral valve prolapse    patient denies this dx   Neuromuscular disorder ([place])    essential tremor hands   Osteopenia    PAC (premature atrial contraction)    on metoprolol   Palpitations    Scoliosis    Shoulder pain    resolved, no loonger a problem   Toxic effect of venom(989.5)    Venous insufficiency     ROS:   All systems reviewed and negative except as noted in the HPI.   Past Surgical History:  Procedure Laterality Date   BACK SURGERY  1994   scoliosis at [facility] spine center   CATARACT EXTRACTION Left 04/2010   COLONOSCOPY  2009   [facility]- Normal   ROTATOR CUFF REPAIR  2006   WISDOM TOOTH EXTRACTION  Family History  Problem Relation Age of Onset   Diabetes Mother    Stroke Paternal Grandmother 25   Colon cancer Neg Hx    Rectal cancer Neg Hx    Stomach cancer Neg Hx    Esophageal cancer Neg Hx      Social History   Socioeconomic History   Marital status: Married    Spouse name: [name]   Number of children: 2   Years of education: associate degree   Highest education level: Not on file  Occupational History   Not on file  Tobacco Use   Smoking status: Never   Smokeless tobacco: Never  Vaping Use   Vaping Use: Never used  Substance and Sexual Activity   Alcohol use: Never    Comment: rarely - holiday   Drug use: No   Sexual activity: Yes    Partners: Male    Birth control/protection: Post-menopausal    Comment: husband vasectomy  Other Topics Concern   Not on file  Social History Narrative   Does not  have a living will.   Desires CPR but would not want prolonged life support.   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       Family: 2 children - Rodman Key and Hydrologist - both [place], 3 grandchildren (age 13 to 58)      Enjoys: reading, knitting, exercise, watch TV, watch husband softball      Exercise: 4 times a week - 45 minute senior fitness class   Diet: tries to eat right - fish a few times a week, no beef, not as many veggies as she should      Safety   Seat belts: Yes    Guns: Yes  and secure   Safe in relationships: Yes          Social Determinants of Health   Financial Resource Strain: Not on file  Food Insecurity: Not on file  Transportation Needs: Not on file  Physical Activity: Not on file  Stress: Not on file  Social Connections: Not on file  Intimate Partner Violence: Not on file     BP 112/68   Pulse 60   Ht '5\' 4"'$  (1.626 m)   Wt 142 lb 6.4 oz (64.6 kg)   LMP 09/13/2000   SpO2 94%   BMI 24.44 kg/m   Physical Exam:  Well appearing NAD HEENT: Unremarkable Neck:  No JVD, no thyromegally Lymphatics:  No adenopathy Back:  No CVA tenderness Lungs:  Clear with no wheezes HEART:  Regular rate rhythm, no murmurs, no rubs, no clicks Abd:  soft, positive bowel sounds, no organomegally, no rebound, no guarding Ext:  2 plus pulses, no edema, no cyanosis, no clubbing Skin:  No rashes no nodules Neuro:  CN II through XII intact, motor grossly intact  EKG - nsr    Assess/Plan:  1. Palpitations - her atrial arrhythmias have essentially resolved on toprol. She will continue. She does not use caffeine or ETOH and she notes that she gets plenty of sleep. 2. Sleep apnea - she was found to have an increased HCT and evaluation demonstrated sleep apnea. She is on CPAP and feels well.   Mikle Bosworth.D

## 2021-11-04 ENCOUNTER — Encounter: Payer: Medicare HMO | Admitting: Physical Therapy

## 2021-11-11 ENCOUNTER — Encounter: Payer: Medicare HMO | Admitting: Physical Therapy

## 2021-11-17 ENCOUNTER — Encounter: Payer: Self-pay | Admitting: Family Medicine

## 2021-11-17 DIAGNOSIS — E2839 Other primary ovarian failure: Secondary | ICD-10-CM

## 2021-12-15 DIAGNOSIS — Z1231 Encounter for screening mammogram for malignant neoplasm of breast: Secondary | ICD-10-CM | POA: Diagnosis not present

## 2021-12-15 LAB — HM MAMMOGRAPHY

## 2021-12-22 ENCOUNTER — Encounter: Payer: Self-pay | Admitting: Family Medicine

## 2021-12-23 ENCOUNTER — Ambulatory Visit (INDEPENDENT_AMBULATORY_CARE_PROVIDER_SITE_OTHER): Payer: Medicare HMO | Admitting: Vascular Surgery

## 2021-12-23 ENCOUNTER — Ambulatory Visit (INDEPENDENT_AMBULATORY_CARE_PROVIDER_SITE_OTHER): Payer: Medicare HMO

## 2021-12-23 ENCOUNTER — Encounter (INDEPENDENT_AMBULATORY_CARE_PROVIDER_SITE_OTHER): Payer: Self-pay | Admitting: Vascular Surgery

## 2021-12-23 VITALS — BP 109/68 | HR 78 | Resp 16 | Wt 142.6 lb

## 2021-12-23 DIAGNOSIS — I83892 Varicose veins of left lower extremities with other complications: Secondary | ICD-10-CM | POA: Diagnosis not present

## 2021-12-23 DIAGNOSIS — M7989 Other specified soft tissue disorders: Secondary | ICD-10-CM | POA: Diagnosis not present

## 2021-12-23 DIAGNOSIS — N1831 Chronic kidney disease, stage 3a: Secondary | ICD-10-CM

## 2021-12-23 DIAGNOSIS — G6289 Other specified polyneuropathies: Secondary | ICD-10-CM

## 2021-12-23 NOTE — Assessment & Plan Note (Signed)
Venous reflux study today demonstrates left great saphenous vein reflux throughout.  No DVT or superficial thrombophlebitis was seen.  We discussed with her options for treatment.  Laser ablation of the left great saphenous vein would certainly be a reasonable option.  She has had longstanding swelling and I do not think this is going to eliminate her swelling is clearly there is a component of lymphedema present as well, but I do think would be some improvement.  She has a husband who has an upcoming shoulder replacement and she will need to be a caretaker for him for the next couple of months.  She is not interested in having this done for a few months and wants to consider her options and try to continue compression socks and elevation through the summer.  We will see her back in about 3 months to discuss whether or not she would like to consider intervention.

## 2021-12-23 NOTE — Progress Notes (Signed)
MRN : [id number]  Dana Massey is a 76 y.o. (1945-11-19) female who presents with chief complaint of  Chief Complaint  Patient presents with   Follow-up    Ultrasound follow up  .  History of Present Illness: Patient returns today in follow up of left leg swelling and venous insufficiency.  She has been diligently wearing her compression socks and elevating her legs.  Her left leg swelling is basically the same as it was at her last visit a couple of months ago.  No new ulceration or infection.  No fevers or chills.  No chest pain or shortness of breath.  Venous reflux study demonstrates no DVT or superficial thrombophlebitis, but the left leg does demonstrate significant venous reflux in the left great saphenous vein.  Current Outpatient Medications  Medication Sig Dispense Refill   Acetaminophen 500 MG coapsule      aspirin EC 81 MG tablet Take 81 mg by mouth daily.     atorvastatin (LIPITOR) 10 MG tablet TAKE 1 TABLET EVERY DAY (NEEDS PHYSICAL IN SEPTEMBER) 90 tablet 3   Calcium-Vitamin D-Vitamin K 500-100-40 MG-UNT-MCG CHEW Chew 1 capsule by mouth daily.       cycloSPORINE (RESTASIS) 0.05 % ophthalmic emulsion Place 1 drop into both eyes 2 (two) times daily.       dorzolamide-timolol (COSOPT) 22.3-6.8 MG/ML ophthalmic solution Place 1 drop into both eyes in the morning and at bedtime.     fish oil-omega-3 fatty acids 1000 MG capsule Take 2 g by mouth daily.       gabapentin (NEURONTIN) 300 MG capsule Take 300 mg by mouth 2 (two) times daily.     latanoprost (XALATAN) 0.005 % ophthalmic solution Place 1 drop into both eyes at bedtime.     MELATONIN PO Take 6 mg by mouth at bedtime as needed.      metoprolol succinate (TOPROL-XL) 25 MG 24 hr tablet TAKE 1 TABLET DAILY WITH OR IMMEDIATELY FOLLOWING A MEAL. 90 tablet 3   Multiple Vitamins-Minerals (MULTIVITAMIN WITH MINERALS) tablet Take 1 tablet by mouth daily.       polyethylene glycol powder (GLYCOLAX/MIRALAX) 17 GM/SCOOP powder  Take 17 g by mouth daily as needed.      sertraline (ZOLOFT) 50 MG tablet TAKE 1 AND 1/2 TABLETS EVERY DAY 135 tablet 3   Zoster Vaccine Adjuvanted [facility]) injection Inject into the muscle. 1 each 1   No current facility-administered medications for this visit.    Past Medical History:  Diagnosis Date   Cataract    left eye surgery to remove, right eye just watching currently   Constipation    Depression    Diverticulosis of colon    Glaucoma    ocular htn bilateral eyes - uses drops    History of blood transfusion 1994   with spinal fusion surgery   IBS (irritable bowel syndrome)    Migraine headache    resolved, no current problem   Mitral valve prolapse    patient denies this dx   Neuromuscular disorder ([place])    essential tremor hands   Osteopenia    PAC (premature atrial contraction)    on metoprolol   Palpitations    Scoliosis    Shoulder pain    resolved, no loonger a problem   Toxic effect of venom(989.5)    Venous insufficiency     Past Surgical History:  Procedure Laterality Date   BACK SURGERY  1994   scoliosis at [facility] spine center  CATARACT EXTRACTION Left 04/2010   COLONOSCOPY  2009   [facility]- Normal   ROTATOR CUFF REPAIR  2006   WISDOM TOOTH EXTRACTION       Social History   Tobacco Use   Smoking status: Never   Smokeless tobacco: Never  Vaping Use   Vaping Use: Never used  Substance Use Topics   Alcohol use: Never    Comment: rarely - holiday   Drug use: No       Family History  Problem Relation Age of Onset   Diabetes Mother    Stroke Paternal Grandmother 67   Colon cancer Neg Hx    Rectal cancer Neg Hx    Stomach cancer Neg Hx    Esophageal cancer Neg Hx      Allergies  Allergen Reactions   Wasp Venom Anaphylaxis    Has EpiPen Has EpiPen    REVIEW OF SYSTEMS (Negative unless checked)   Constitutional: '[]'$ Weight loss  '[]'$ Fever  '[]'$ Chills Cardiac: '[]'$ Chest pain   '[]'$ Chest pressure   '[]'$ Palpitations   '[]'$ Shortness of breath  when laying flat   '[]'$ Shortness of breath at rest   '[]'$ Shortness of breath with exertion. Vascular:  '[]'$ Pain in legs with walking   '[]'$ Pain in legs at rest   '[]'$ Pain in legs when laying flat   '[]'$ Claudication   '[]'$ Pain in feet when walking  '[]'$ Pain in feet at rest  '[]'$ Pain in feet when laying flat   '[]'$ History of DVT   '[]'$ Phlebitis   '[x]'$ Swelling in legs   '[]'$ Varicose veins   '[]'$ Non-healing ulcers Pulmonary:   '[]'$ Uses home oxygen   '[]'$ Productive cough   '[]'$ Hemoptysis   '[]'$ Wheeze  '[]'$ COPD   '[]'$ Asthma Neurologic:  '[]'$ Dizziness  '[]'$ Blackouts   '[]'$ Seizures   '[]'$ History of stroke   '[]'$ History of TIA  '[]'$ Aphasia   '[]'$ Temporary blindness   '[]'$ Dysphagia   '[]'$ Weakness or numbness in arms   '[]'$ Weakness or numbness in legs Musculoskeletal:  '[]'$ Arthritis   '[]'$ Joint swelling   '[x]'$ Joint pain   '[x]'$ Low back pain Hematologic:  '[]'$ Easy bruising  '[]'$ Easy bleeding   '[]'$ Hypercoagulable state   '[]'$ Anemic  '[]'$ Hepatitis Gastrointestinal:  '[]'$ Blood in stool   '[]'$ Vomiting blood  '[]'$ Gastroesophageal reflux/heartburn   '[]'$ Abdominal pain Genitourinary:  '[]'$ Chronic kidney disease   '[]'$ Difficult urination  '[]'$ Frequent urination  '[]'$ Burning with urination   '[]'$ Hematuria Skin:  '[]'$ Rashes   '[]'$ Ulcers   '[]'$ Wounds Psychological:  '[]'$ History of anxiety   '[x]'$  History of major depression.   Physical Examination  BP 109/68 (BP Location: Right Arm)   Pulse 78   Resp 16   Wt 142 lb 9.6 oz (64.7 kg)   LMP 09/13/2000   BMI 24.48 kg/m  Gen:  WD/WN, NAD. Appears younger than stated age. Head: [place]/AT, No temporalis wasting. Ear/Nose/Throat: Hearing grossly intact, nares w/o erythema or drainage Eyes: Conjunctiva clear. Sclera non-icteric Neck: Supple.  Trachea midline Pulmonary:  Good air movement, no use of accessory muscles.  Cardiac: RRR, no JVD Vascular:  Vessel Right Left  Radial Palpable Palpable                          PT Palpable 1+ Palpable  DP Palpable 1+ Palpable   Gastrointestinal: soft, non-tender/non-distended. No guarding/reflex.  Musculoskeletal: M/S 5/5  throughout.  No deformity or atrophy. 1-2+ LLE edema. Neurologic: Sensation grossly intact in extremities.  Symmetrical.  Speech is fluent.  Psychiatric: Judgment intact, Mood & affect appropriate for pt's clinical situation. Dermatologic: No rashes or ulcers noted.  No cellulitis  or open wounds.      Labs Recent Results (from the past 2160 hour(s))  HM MAMMOGRAPHY     Status: None   Collection Time: 12/15/21 12:00 AM  Result Value Ref Range   HM Mammogram 0-4 Bi-Rad 0-4 Bi-Rad, Self Reported Normal    Radiology No results found.  Assessment/Plan Chronic kidney disease, stage 3a Can exacerbate LE swelling.   Peripheral neuropathy Can worsen LE swelling and symptoms.  Varicose veins of leg with swelling, left Venous reflux study today demonstrates left great saphenous vein reflux throughout.  No DVT or superficial thrombophlebitis was seen.  We discussed with her options for treatment.  Laser ablation of the left great saphenous vein would certainly be a reasonable option.  She has had longstanding swelling and I do not think this is going to eliminate her swelling is clearly there is a component of lymphedema present as well, but I do think would be some improvement.  She has a husband who has an upcoming shoulder replacement and she will need to be a caretaker for him for the next couple of months.  She is not interested in having this done for a few months and wants to consider her options and try to continue compression socks and elevation through the summer.  We will see her back in about 3 months to discuss whether or not she would like to consider intervention.    Leotis Pain, MD  12/23/2021 6:48 PM    This note was created with Dragon medical transcription system.  Any errors from dictation are purely unintentional

## 2021-12-29 ENCOUNTER — Encounter: Payer: Self-pay | Admitting: Family Medicine

## 2022-01-31 ENCOUNTER — Other Ambulatory Visit: Payer: Self-pay | Admitting: Internal Medicine

## 2022-02-06 DIAGNOSIS — R251 Tremor, unspecified: Secondary | ICD-10-CM | POA: Diagnosis not present

## 2022-02-06 DIAGNOSIS — G629 Polyneuropathy, unspecified: Secondary | ICD-10-CM | POA: Diagnosis not present

## 2022-02-17 DIAGNOSIS — M8589 Other specified disorders of bone density and structure, multiple sites: Secondary | ICD-10-CM | POA: Diagnosis not present

## 2022-02-17 LAB — HM DEXA SCAN

## 2022-02-18 ENCOUNTER — Telehealth: Payer: Self-pay | Admitting: Family Medicine

## 2022-02-18 NOTE — Telephone Encounter (Signed)
Please let pt know that her bone density is still in the osteopenia range. I've sent a mychart with advice.

## 2022-02-18 NOTE — Telephone Encounter (Signed)
Called patient reviewed all information and repeated back to me. Will call if any questions.  ? ?

## 2022-02-20 ENCOUNTER — Encounter: Payer: Self-pay | Admitting: Family Medicine

## 2022-02-23 DIAGNOSIS — H5213 Myopia, bilateral: Secondary | ICD-10-CM | POA: Diagnosis not present

## 2022-02-23 DIAGNOSIS — Z0100 Encounter for examination of eyes and vision without abnormal findings: Secondary | ICD-10-CM | POA: Diagnosis not present

## 2022-03-04 DIAGNOSIS — D2371 Other benign neoplasm of skin of right lower limb, including hip: Secondary | ICD-10-CM | POA: Diagnosis not present

## 2022-03-04 DIAGNOSIS — Z872 Personal history of diseases of the skin and subcutaneous tissue: Secondary | ICD-10-CM | POA: Diagnosis not present

## 2022-03-04 DIAGNOSIS — Z08 Encounter for follow-up examination after completed treatment for malignant neoplasm: Secondary | ICD-10-CM | POA: Diagnosis not present

## 2022-03-04 DIAGNOSIS — L448 Other specified papulosquamous disorders: Secondary | ICD-10-CM | POA: Diagnosis not present

## 2022-03-04 DIAGNOSIS — L814 Other melanin hyperpigmentation: Secondary | ICD-10-CM | POA: Diagnosis not present

## 2022-03-04 DIAGNOSIS — L821 Other seborrheic keratosis: Secondary | ICD-10-CM | POA: Diagnosis not present

## 2022-03-04 DIAGNOSIS — Z85828 Personal history of other malignant neoplasm of skin: Secondary | ICD-10-CM | POA: Diagnosis not present

## 2022-03-04 DIAGNOSIS — L989 Disorder of the skin and subcutaneous tissue, unspecified: Secondary | ICD-10-CM | POA: Diagnosis not present

## 2022-03-04 DIAGNOSIS — D485 Neoplasm of uncertain behavior of skin: Secondary | ICD-10-CM | POA: Diagnosis not present

## 2022-03-04 DIAGNOSIS — L820 Inflamed seborrheic keratosis: Secondary | ICD-10-CM | POA: Diagnosis not present

## 2022-03-16 ENCOUNTER — Encounter: Payer: Self-pay | Admitting: Family Medicine

## 2022-03-16 ENCOUNTER — Ambulatory Visit (INDEPENDENT_AMBULATORY_CARE_PROVIDER_SITE_OTHER): Payer: Medicare HMO | Admitting: Family Medicine

## 2022-03-16 VITALS — BP 120/70 | HR 71 | Temp 97.3°F | Ht 64.0 in | Wt 140.1 lb

## 2022-03-16 DIAGNOSIS — C439 Malignant melanoma of skin, unspecified: Secondary | ICD-10-CM | POA: Diagnosis not present

## 2022-03-16 DIAGNOSIS — Z0000 Encounter for general adult medical examination without abnormal findings: Secondary | ICD-10-CM

## 2022-03-16 DIAGNOSIS — F325 Major depressive disorder, single episode, in full remission: Secondary | ICD-10-CM | POA: Diagnosis not present

## 2022-03-16 DIAGNOSIS — R7303 Prediabetes: Secondary | ICD-10-CM | POA: Insufficient documentation

## 2022-03-16 DIAGNOSIS — E782 Mixed hyperlipidemia: Secondary | ICD-10-CM | POA: Diagnosis not present

## 2022-03-16 DIAGNOSIS — D582 Other hemoglobinopathies: Secondary | ICD-10-CM | POA: Diagnosis not present

## 2022-03-16 DIAGNOSIS — M8589 Other specified disorders of bone density and structure, multiple sites: Secondary | ICD-10-CM

## 2022-03-16 MED ORDER — SERTRALINE HCL 50 MG PO TABS: ORAL_TABLET | ORAL | 1 refills | Status: DC

## 2022-03-16 NOTE — Patient Instructions (Signed)
Kidney disease - Avoid Ibuprofen or Aleve - we will recheck labs   Screen for diabetes today

## 2022-03-16 NOTE — Assessment & Plan Note (Signed)
Continue vitamin d and calcium.

## 2022-03-16 NOTE — Progress Notes (Signed)
Subjective:   Dana Massey is a 76 y.o. female who presents for Medicare Annual (Subsequent) preventive examination.  Review of Systems    Review of Systems  Constitutional:  Negative for chills and fever.  HENT:  Negative for congestion and sore throat.   Eyes:  Negative for blurred vision and double vision.  Respiratory:  Negative for shortness of breath.   Cardiovascular:  Negative for chest pain.  Gastrointestinal:  Negative for heartburn, nausea and vomiting.  Genitourinary: Negative.   Musculoskeletal: Negative.  Negative for myalgias.  Skin:  Negative for rash.  Neurological:  Negative for dizziness and headaches.  Endo/Heme/Allergies:  Does not bruise/bleed easily.  Psychiatric/Behavioral:  Negative for depression. The patient is not nervous/anxious.     Cardiac Risk Factors include: advanced age (>81mn, >>24women);hypertension     Objective:    Today's Vitals   03/16/22 1407  BP: 120/70  Pulse: 71  Temp: (!) 97.3 F (36.3 C)  TempSrc: Temporal  SpO2: 99%  Weight: 140 lb 2 oz (63.6 kg)  Height: '5\' 4"'$  (1.626 m)   Body mass index is 24.05 kg/m.     03/16/2022    2:15 PM 05/10/2021    3:18 PM 03/12/2021   10:17 AM 04/03/2020    2:47 PM 03/05/2020    9:11 AM 01/01/2020    1:48 PM 07/05/2019    2:24 PM  Advanced Directives  Does Patient Have a Medical Advance Directive? Yes Yes Yes No Yes Yes Yes  Type of Advance Directive Living will Living will [place] Living will;Healthcare Power of Attorney  Does patient want to make changes to medical advance directive? No - Patient declined  No - Patient declined      Copy of [place] Chart?   Yes - validated most recent copy scanned in chart (See row information)      Would patient like information on creating a medical advance directive?  No - Patient declined  No - Patient declined  No - Patient declined     Current  Medications (verified) Outpatient Encounter Medications as of 03/16/2022  Medication Sig   Acetaminophen 500 MG coapsule    aspirin EC 81 MG tablet Take 81 mg by mouth daily.   atorvastatin (LIPITOR) 10 MG tablet TAKE 1 TABLET EVERY DAY (NEEDS PHYSICAL IN SEPTEMBER)   Calcium-Vitamin D-Vitamin K 500-100-40 MG-UNT-MCG CHEW Chew 1 capsule by mouth daily.     cycloSPORINE (RESTASIS) 0.05 % ophthalmic emulsion Place 1 drop into both eyes 2 (two) times daily.     dorzolamide-timolol (COSOPT) 22.3-6.8 MG/ML ophthalmic solution Place 1 drop into both eyes in the morning and at bedtime.   fish oil-omega-3 fatty acids 1000 MG capsule Take 2 g by mouth daily.     gabapentin (NEURONTIN) 400 MG capsule Take 400 mg by mouth daily at 12 noon.   latanoprost (XALATAN) 0.005 % ophthalmic solution Place 1 drop into both eyes at bedtime.   MELATONIN PO Take 6 mg by mouth at bedtime as needed.    metoprolol succinate (TOPROL-XL) 25 MG 24 hr tablet TAKE 1 TABLET DAILY WITH OR IMMEDIATELY FOLLOWING A MEAL.   Multiple Vitamins-Minerals (MULTIVITAMIN WITH MINERALS) tablet Take 1 tablet by mouth daily.     polyethylene glycol powder (GLYCOLAX/MIRALAX) 17 GM/SCOOP powder Take 17 g by mouth daily as needed.    [DISCONTINUED] sertraline (ZOLOFT) 50 MG tablet TAKE 1 AND 1/2 TABLETS EVERY DAY   [  DISCONTINUED] Zoster Vaccine Adjuvanted [facility]) injection Inject into the muscle.   sertraline (ZOLOFT) 50 MG tablet TAKE 1 AND 1/2 TABLETS EVERY DAY   No facility-administered encounter medications on file as of 03/16/2022.    Allergies (verified) Wasp venom   History: Past Medical History:  Diagnosis Date   Cataract    left eye surgery to remove, right eye just watching currently   Constipation    Depression    Diverticulosis of colon    Glaucoma    ocular htn bilateral eyes - uses drops    History of blood transfusion 1994   with spinal fusion surgery   IBS (irritable bowel syndrome)    Migraine headache     resolved, no current problem   Mitral valve prolapse    patient denies this dx   Neuromuscular disorder ([place])    essential tremor hands   Osteopenia    PAC (premature atrial contraction)    on metoprolol   Palpitations    Scoliosis    Shoulder pain    resolved, no loonger a problem   Toxic effect of venom(989.5)    Venous insufficiency    Past Surgical History:  Procedure Laterality Date   BACK SURGERY  1994   scoliosis at [facility] spine center   CATARACT EXTRACTION Left 04/2010   COLONOSCOPY  2009   [facility]- Normal   ROTATOR CUFF REPAIR  2006   WISDOM TOOTH EXTRACTION     Family History  Problem Relation Age of Onset   Diabetes Mother    Stroke Paternal Grandmother 3   Colon cancer Neg Hx    Rectal cancer Neg Hx    Stomach cancer Neg Hx    Esophageal cancer Neg Hx    Social History   Socioeconomic History   Marital status: Married    Spouse name: [name]   Number of children: 2   Years of education: associate degree   Highest education level: Not on file  Occupational History   Not on file  Tobacco Use   Smoking status: Never   Smokeless tobacco: Never  Vaping Use   Vaping Use: Never used  Substance and Sexual Activity   Alcohol use: Never    Comment: rarely - holiday   Drug use: No   Sexual activity: Yes    Partners: Male    Birth control/protection: Post-menopausal    Comment: husband vasectomy  Other Topics Concern   Not on file  Social History Narrative   Does not have a living will.   Desires CPR but would not want prolonged life support.   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       Family: 2 children - Rodman Key and Hydrologist - both [place], 3 grandchildren (age 60 to 7)      Enjoys: reading, knitting, exercise, watch TV, watch husband softball      Exercise: 4 times a week - 45 minute senior fitness class    Diet: tries to eat right - fish a few times a week, no beef, not as many veggies as she should      Safety   Seat belts: Yes    Guns: Yes  and secure   Safe in relationships: Yes          Social Determinants of Health   Financial Resource Strain: Not on file  Food Insecurity: Not on file  Transportation Needs: Not on file  Physical Activity: Not on file  Stress: Not on file  Social Connections: Not on file    Tobacco Counseling Counseling given: Not Answered   Clinical Intake:  Pre-visit preparation completed: No  Pain : No/denies pain     BMI - recorded: 24.05 Nutritional Status: BMI of 19-24  Normal Nutritional Risks: None Diabetes: No  How often do you need to have someone help you when you read instructions, pamphlets, or other written materials from your doctor or pharmacy?: 1 - Never  Diabetic? -no  Interpreter Needed?: No      Activities of Daily Living    03/16/2022    2:16 PM  In your present state of health, do you have any difficulty performing the following activities:  Hearing? 0  Vision? 0  Difficulty concentrating or making decisions? 0  Walking or climbing stairs? 1  Comment peripheral neuropathy impacts this  Dressing or bathing? 0  Doing errands, shopping? 0  Preparing Food and eating ? N  Using the Toilet? N  In the past six months, have you accidently leaked urine? N  Do you have problems with loss of bowel control? N  Managing your Medications? N  Managing your Finances? N  Housekeeping or managing your Housekeeping? N    Patient Care Team: Lesleigh Noe, MD as PCP - General (Family Medicine) Evans Lance, MD as Consulting Physician (Cardiology) Macarthur Critchley, [place] as Referring Physician (Optometry) Glennie Isle, PA-C as Physician Assistant (Dermatology) Vladimir Crofts, MD as Consulting Physician (Neurology) Lucky Cowboy Erskine Squibb, MD as Referring Physician (Vascular Surgery)  Indicate any recent Medical Services you may have  received from other than [facility] providers in the past year (date may be approximate).     Assessment:   This is a routine wellness examination for Vicki.  Hearing/Vision screen Hearing Screening - Comments:: No concerns Vision Screening - Comments:: Pt gets yearly eye exams at [facility]  Dietary issues and exercise activities discussed: Current Exercise Habits: Home exercise routine, Type of exercise: strength training/weights, Time (Minutes): 45, Frequency (Times/Week): 3, Weekly Exercise (Minutes/Week): 135, Intensity: Moderate, Exercise limited by: neurologic condition(s)   Goals Addressed             This Visit's Progress    patient       Maintain lifestyle      Depression Screen    03/16/2022    2:56 PM 03/12/2021   10:08 AM 03/05/2020    9:45 AM 03/05/2020    9:12 AM 02/08/2019   10:13 AM 01/19/2018    8:18 AM 01/05/2017   11:23 AM  PHQ 2/9 Scores  PHQ - 2 Score 0 0 0 0 0 0 0  PHQ- 9 Score 2 0         Fall Risk    03/16/2022    2:06 PM 03/12/2021    9:57 AM 03/05/2020    9:12 AM 03/05/2020    8:55 AM 02/08/2019   10:13 AM  Fall Risk   Falls in the past year? 1 1 0 0 0  Number falls in past yr: 0 0 0 0   Injury with Fall? 1 1 0    Comment concussion Broke bone in foot     Risk for fall due to : Impaired balance/gait  No Fall Risks    Follow up   Falls evaluation completed      [place]  TO THE HOME:  Any stairs in or around the home? Yes  If so, are there any without handrails? Yes  Home free of loose throw rugs in walkways, pet beds, electrical cords, etc? Yes  Adequate lighting in your home to reduce risk of falls? Yes   ASSISTIVE DEVICES UTILIZED TO PREVENT FALLS:  Life alert? No  Use of a cane, walker or w/c? No  Grab bars in the bathroom? Yes  Shower chair or bench in shower? Yes  Elevated toilet seat or a handicapped toilet? Yes   Cognitive Function:    01/05/2017   11:24 AM 12/25/2015   11:20 AM  MMSE - Mini  Mental State Exam  Orientation to time 5 5  Orientation to Place 5 5  Registration 3 3  Attention/ Calculation 0 0  Recall 3 3  Language- name 2 objects 0 0  Language- repeat 1 1  Language- follow 3 step command 3 3  Language- read & follow direction 0 0  Write a sentence 0 0  Copy design 0 0  Total score 20 20        03/05/2020    9:14 AM 02/08/2019   10:15 AM  6CIT Screen  What Year? 0 points 0 points  What month? 0 points 0 points  What time? 0 points 0 points  Count back from 20 0 points 0 points  Months in reverse 0 points 0 points  Repeat phrase 2 points 0 points  Total Score 2 points 0 points     Mini-Cog - 03/16/22 1418     Normal clock drawing test? yes    How many words correct? 3              Immunizations Immunization History  Administered Date(s) Administered   Fluad Quad(high Dose 65+) 02/08/2019, 03/05/2020, 03/12/2021, 03/06/2022   Influenza Split 05/26/2011, 03/22/2012   Influenza Whole 03/07/2008, 03/29/2009, 03/14/2010   Influenza,inj,Quad PF,6+ Mos 03/21/2013, 02/21/2014, 03/28/2015, 04/15/2016, 03/19/2017   Influenza,trivalent, recombinat, inj, PF 03/11/2018   PFIZER Comirnaty(Gray Top)Covid-19 Tri-Sucrose Vaccine 10/29/2020   PFIZER(Purple Top)SARS-COV-2 Vaccination 07/28/2019, 08/23/2019, 04/19/2020   Pfizer Covid-19 Vaccine Bivalent Booster 59yr & up 03/25/2021   Pneumococcal Conjugate-13 12/18/2013   Pneumococcal Polysaccharide-23 11/20/2011   Td 12/24/2014   Zoster Recombinat (Shingrix) 06/23/2021, 09/05/2021   Zoster, Live 12/18/2013    TDAP status: Up to date  Flu Vaccine status: Up to date  Pneumococcal vaccine status: Up to date  Covid-19 vaccine status: Completed vaccines  Qualifies for Shingles Vaccine? Yes   Zostavax completed Yes   Shingrix Completed?: Yes  Screening Tests Health Maintenance  Topic Date Due   COVID-19 Vaccine (6 - Pfizer risk series) 05/20/2021   MAMMOGRAM  12/16/2022   COLONOSCOPY (Pts  45-474yrInsurance coverage will need to be confirmed)  01/05/2023   DEXA SCAN  02/18/2024   TETANUS/TDAP  12/23/2024   Pneumonia Vaccine 6569Years old  Completed   INFLUENZA VACCINE  Completed   Hepatitis C Screening  Completed   Zoster Vaccines- Shingrix  Completed   HPV VACCINES  Aged Out    Health Maintenance  Health Maintenance Due  Topic Date Due   COVID-19 Vaccine (6 - Pfizer risk series) 05/20/2021    Colorectal cancer screening: Type of screening: Colonoscopy. Completed 2019. Repeat every 5 years  Mammogram status: Completed 12/2021. Repeat every year  Bone Density status: Completed 2023. Results reflect: Bone density results: OSTEOPENIA. Repeat every 2-5 years.  Lung Cancer Screening: (Low Dose CT Chest recommended  if Age 49-80 years, 77 pack-year currently smoking OR have quit w/in 15years.) does not qualify.    Additional Screening:  Hepatitis C Screening: does qualify; Completed 2017  Vision Screening: Recommended annual ophthalmology exams for early detection of glaucoma and other disorders of the eye. Is the patient up to date with their annual eye exam?  Yes    Dental Screening: Recommended annual dental exams for proper oral hygiene  Community Resource Referral / Chronic Care Management: CRR required this visit?  No   CCM required this visit?  No      Plan:    Problem List Items Addressed This Visit       Musculoskeletal and Integument   Osteopenia    Continue vitamin d and calcium.       Relevant Orders   CBC   Melanoma of skin ([place])    Following with [facility] - anticipating Mohs surgery in November.         Other   Depression   Relevant Medications   sertraline (ZOLOFT) 50 MG tablet   Mixed hyperlipidemia   Relevant Orders   Comprehensive metabolic panel   Lipid panel   CBC   Prediabetes    Outside hemoglobin a1c elevated. Recheck.       Relevant Orders   Hemoglobin A1c   CBC   Other Visit Diagnoses      Encounter for Medicare annual wellness exam    -  Primary   Elevated hemoglobin ([place])       Relevant Orders   CBC       I have personally reviewed and noted the following in the patient's chart:   Medical and social history Use of alcohol, tobacco or illicit drugs  Current medications and supplements including opioid prescriptions. Patient is not currently taking opioid prescriptions. Functional ability and status Nutritional status Physical activity Advanced directives List of other physicians Hospitalizations, surgeries, and ER visits in previous 12 months Vitals Screenings to include cognitive, depression, and falls Referrals and appointments  In addition, I have reviewed and discussed with patient certain preventive protocols, quality metrics, and best practice recommendations. A written personalized care plan for preventive services as well as general preventive health recommendations were provided to patient.     Lesleigh Noe, MD   03/16/2022

## 2022-03-16 NOTE — Assessment & Plan Note (Signed)
Outside hemoglobin a1c elevated. Recheck.

## 2022-03-16 NOTE — Assessment & Plan Note (Signed)
Following with [facility] - anticipating Mohs surgery in November.

## 2022-03-17 LAB — COMPREHENSIVE METABOLIC PANEL
ALT: 17 U/L (ref 0–35)
AST: 24 U/L (ref 0–37)
Albumin: 4.3 g/dL (ref 3.5–5.2)
Alkaline Phosphatase: 70 U/L (ref 39–117)
BUN: 20 mg/dL (ref 6–23)
CO2: 32 mEq/L (ref 19–32)
Calcium: 9.9 mg/dL (ref 8.4–10.5)
Chloride: 98 mEq/L (ref 96–112)
Creatinine, Ser: 0.97 mg/dL (ref 0.40–1.20)
GFR: 57.16 mL/min — ABNORMAL LOW (ref >60.00)
Glucose, Bld: 97 mg/dL (ref 70–99)
Potassium: 4.8 mEq/L (ref 3.5–5.1)
Sodium: 138 mEq/L (ref 135–145)
Total Bilirubin: 0.4 mg/dL (ref 0.2–1.2)
Total Protein: 7.1 g/dL (ref 6.0–8.3)

## 2022-03-17 LAB — CBC
HCT: 47.6 % — ABNORMAL HIGH (ref 36.0–46.0)
Hemoglobin: 15.8 g/dL — ABNORMAL HIGH (ref 12.0–15.0)
MCHC: 33.2 g/dL (ref 30.0–36.0)
MCV: 97.7 fl (ref 78.0–100.0)
Platelets: 187 10*3/uL (ref 150.0–400.0)
RBC: 4.87 Mil/uL (ref 3.87–5.11)
RDW: 13.3 % (ref 11.5–15.5)
WBC: 7.6 10*3/uL (ref 4.0–10.5)

## 2022-03-17 LAB — HEMOGLOBIN A1C: Hgb A1c MFr Bld: 6.7 % — ABNORMAL HIGH (ref 4.6–6.5)

## 2022-03-17 LAB — LIPID PANEL
Cholesterol: 138 mg/dL (ref 0–200)
HDL: 53.5 mg/dL (ref >39.00)
LDL Cholesterol: 59 mg/dL (ref 0–99)
NonHDL: 84.62
Total CHOL/HDL Ratio: 3
Triglycerides: 127 mg/dL (ref 0.0–149.0)
VLDL: 25.4 mg/dL (ref 0.0–40.0)

## 2022-03-18 ENCOUNTER — Other Ambulatory Visit: Payer: Self-pay | Admitting: Family Medicine

## 2022-03-18 DIAGNOSIS — N1831 Type 2 diabetes mellitus with diabetic chronic kidney disease: Secondary | ICD-10-CM | POA: Insufficient documentation

## 2022-03-24 ENCOUNTER — Ambulatory Visit (INDEPENDENT_AMBULATORY_CARE_PROVIDER_SITE_OTHER): Payer: Medicare HMO | Admitting: Vascular Surgery

## 2022-03-29 NOTE — Telephone Encounter (Signed)
[facility] letter sent to patient  Information for Referral #: [id number]   Diagnoses:   E11.22,N18.31 (ICD-10-CM) - Type 2 diabetes mellitus with stage 3a chronic kidney disease, without long-term current use of insulin ([place])   Procedures: REF20 - AMB REFERRAL TO NUTRITION AND DIABETIC EDUCATION Authorization #:     Referring Provider [place] [place] [facility] [place] [phone] Referring To Provider [place] [address] [id number] [place] [address] [phone]   Referral Start Date: 03/18/2022 Referral End Date: 03/18/2023

## 2022-03-29 NOTE — Telephone Encounter (Signed)
They had started processing the referral not long after it was placed and they had already contacted the patient one time. They will contact the patient - they work from a [place] so they are processing it. It looks like they contact the patient and left a message - not sure if the patient has seen the VM yet one her phone?

## 2022-03-31 ENCOUNTER — Encounter: Payer: Self-pay | Admitting: Family Medicine

## 2022-04-07 ENCOUNTER — Ambulatory Visit (INDEPENDENT_AMBULATORY_CARE_PROVIDER_SITE_OTHER): Payer: Medicare HMO | Admitting: Vascular Surgery

## 2022-04-07 ENCOUNTER — Encounter (INDEPENDENT_AMBULATORY_CARE_PROVIDER_SITE_OTHER): Payer: Self-pay | Admitting: Vascular Surgery

## 2022-04-07 VITALS — BP 111/71 | HR 86 | Resp 17 | Ht 64.0 in | Wt 140.6 lb

## 2022-04-07 DIAGNOSIS — E1122 Type 2 diabetes mellitus with diabetic chronic kidney disease: Secondary | ICD-10-CM

## 2022-04-07 DIAGNOSIS — I83892 Varicose veins of left lower extremities with other complications: Secondary | ICD-10-CM | POA: Diagnosis not present

## 2022-04-07 DIAGNOSIS — E782 Mixed hyperlipidemia: Secondary | ICD-10-CM | POA: Diagnosis not present

## 2022-04-07 DIAGNOSIS — N1831 Chronic kidney disease, stage 3a: Secondary | ICD-10-CM | POA: Diagnosis not present

## 2022-04-07 NOTE — Progress Notes (Signed)
MRN : [id number]  Dana Massey is a 76 y.o. (1945/08/08) female who presents with chief complaint of No chief complaint on file. [name]  History of Present Illness: Patient returns today in follow up of her venous insufficiency.  She has been diligently wearing her compression socks and elevating her legs.  This is resulted in significant improvement in pain and swelling in the left lower extremity.  Overall she is doing quite well.  Current Outpatient Medications  Medication Sig Dispense Refill   Acetaminophen 500 MG coapsule      aspirin EC 81 MG tablet Take 81 mg by mouth daily.     atorvastatin (LIPITOR) 10 MG tablet TAKE 1 TABLET EVERY DAY (NEEDS PHYSICAL IN SEPTEMBER) 90 tablet 3   Calcium-Vitamin D-Vitamin K 500-100-40 MG-UNT-MCG CHEW Chew 1 capsule by mouth daily.       dorzolamide-timolol (COSOPT) 22.3-6.8 MG/ML ophthalmic solution Place 1 drop into both eyes in the morning and at bedtime.     fish oil-omega-3 fatty acids 1000 MG capsule Take 2 g by mouth daily.       gabapentin (NEURONTIN) 400 MG capsule Take 400 mg by mouth daily at 12 noon.     latanoprost (XALATAN) 0.005 % ophthalmic solution Place 1 drop into both eyes at bedtime.     MELATONIN PO Take 6 mg by mouth at bedtime as needed.      metoprolol succinate (TOPROL-XL) 25 MG 24 hr tablet TAKE 1 TABLET DAILY WITH OR IMMEDIATELY FOLLOWING A MEAL. 90 tablet 1   Multiple Vitamins-Minerals (MULTIVITAMIN WITH MINERALS) tablet Take 1 tablet by mouth daily.       polyethylene glycol powder (GLYCOLAX/MIRALAX) 17 GM/SCOOP powder Take 17 g by mouth daily as needed.      sertraline (ZOLOFT) 50 MG tablet TAKE 1 AND 1/2 TABLETS EVERY DAY 135 tablet 1   No current facility-administered medications for this visit.    Past Medical History:  Diagnosis Date   Cataract    left eye surgery to remove, right eye just watching currently   Constipation    Depression    Diverticulosis of colon    Glaucoma    ocular htn bilateral eyes  - uses drops    History of blood transfusion 1994   with spinal fusion surgery   IBS (irritable bowel syndrome)    Migraine headache    resolved, no current problem   Mitral valve prolapse    patient denies this dx   Neuromuscular disorder ([place])    essential tremor hands   Osteopenia    PAC (premature atrial contraction)    on metoprolol   Palpitations    Scoliosis    Shoulder pain    resolved, no loonger a problem   Toxic effect of venom(989.5)    Venous insufficiency     Past Surgical History:  Procedure Laterality Date   BACK SURGERY  1994   scoliosis at [facility] center   CATARACT EXTRACTION Left 04/2010   COLONOSCOPY  2009   [facility]- Normal   ROTATOR CUFF REPAIR  2006   WISDOM TOOTH EXTRACTION       Social History   Tobacco Use   Smoking status: Never   Smokeless tobacco: Never  Vaping Use   Vaping Use: Never used  Substance Use Topics   Alcohol use: Never    Comment: rarely - holiday   Drug use: No      Family History  Problem Relation Age of Onset  Diabetes Mother    Stroke Paternal Grandmother 49   Colon cancer Neg Hx    Rectal cancer Neg Hx    Stomach cancer Neg Hx    Esophageal cancer Neg Hx      Allergies  Allergen Reactions   Wasp Venom Anaphylaxis    Has EpiPen Has EpiPen    REVIEW OF SYSTEMS (Negative unless checked)   Constitutional: '[]'$ Weight loss  '[]'$ Fever  '[]'$ Chills Cardiac: '[]'$ Chest pain   '[]'$ Chest pressure   '[]'$ Palpitations   '[]'$ Shortness of breath when laying flat   '[]'$ Shortness of breath at rest   '[]'$ Shortness of breath with exertion. Vascular:  '[]'$ Pain in legs with walking   '[]'$ Pain in legs at rest   '[]'$ Pain in legs when laying flat   '[]'$ Claudication   '[]'$ Pain in feet when walking  '[]'$ Pain in feet at rest  '[]'$ Pain in feet when laying flat   '[]'$ History of DVT   '[]'$ Phlebitis   '[x]'$ Swelling in legs   '[]'$ Varicose veins   '[]'$ Non-healing ulcers Pulmonary:   '[]'$ Uses home oxygen   '[]'$ Productive cough   '[]'$ Hemoptysis   '[]'$ Wheeze  '[]'$ COPD    '[]'$ Asthma Neurologic:  '[]'$ Dizziness  '[]'$ Blackouts   '[]'$ Seizures   '[]'$ History of stroke   '[]'$ History of TIA  '[]'$ Aphasia   '[]'$ Temporary blindness   '[]'$ Dysphagia   '[]'$ Weakness or numbness in arms   '[]'$ Weakness or numbness in legs Musculoskeletal:  '[]'$ Arthritis   '[]'$ Joint swelling   '[x]'$ Joint pain   '[x]'$ Low back pain Hematologic:  '[]'$ Easy bruising  '[]'$ Easy bleeding   '[]'$ Hypercoagulable state   '[]'$ Anemic  '[]'$ Hepatitis Gastrointestinal:  '[]'$ Blood in stool   '[]'$ Vomiting blood  '[]'$ Gastroesophageal reflux/heartburn   '[]'$ Abdominal pain Genitourinary:  '[]'$ Chronic kidney disease   '[]'$ Difficult urination  '[]'$ Frequent urination  '[]'$ Burning with urination   '[]'$ Hematuria Skin:  '[]'$ Rashes   '[]'$ Ulcers   '[]'$ Wounds Psychological:  '[]'$ History of anxiety   '[x]'$  History of major depression.  Physical Examination  BP 111/71 (BP Location: Right Arm)   Pulse 86   Resp 17   Ht '5\' 4"'$  (1.626 m)   Wt 140 lb 9.6 oz (63.8 kg)   LMP 09/13/2000   BMI 24.13 kg/m  Gen:  WD/WN, NAD Head: [place]/AT, No temporalis wasting. Ear/Nose/Throat: Hearing grossly intact, nares w/o erythema or drainage Eyes: Conjunctiva clear. Sclera non-icteric Neck: Supple.  Trachea midline Pulmonary:  Good air movement, no use of accessory muscles.  Cardiac: RRR, no JVD Vascular:  Vessel Right Left  Radial Palpable Palpable               Musculoskeletal: M/S 5/5 throughout.  No deformity or atrophy. Trace LLE edema. Neurologic: Sensation grossly intact in extremities.  Symmetrical.  Speech is fluent.  Psychiatric: Judgment intact, Mood & affect appropriate for pt's clinical situation. Dermatologic: No rashes or ulcers noted.  No cellulitis or open wounds.      Labs Recent Results (from the past 2160 hour(s))  HM DEXA SCAN     Status: None   Collection Time: 02/17/22 12:00 AM  Result Value Ref Range   HM Dexa Scan osteopenia   Comprehensive metabolic panel     Status: Abnormal   Collection Time: 03/16/22  2:39 PM  Result Value Ref Range   Sodium 138 135 - 145 mEq/L    Potassium 4.8 3.5 - 5.1 mEq/L   Chloride 98 96 - 112 mEq/L   CO2 32 19 - 32 mEq/L   Glucose, Bld 97 70 - 99 mg/dL   BUN 20 6 - 23 mg/dL   Creatinine,  Ser 0.97 0.40 - 1.20 mg/dL   Total Bilirubin 0.4 0.2 - 1.2 mg/dL   Alkaline Phosphatase 70 39 - 117 U/L   AST 24 0 - 37 U/L   ALT 17 0 - 35 U/L   Total Protein 7.1 6.0 - 8.3 g/dL   Albumin 4.3 3.5 - 5.2 g/dL   GFR 57.16 (L) >60.00 mL/min    Comment: Calculated using the CKD-EPI Creatinine Equation (2021)   Calcium 9.9 8.4 - 10.5 mg/dL  Lipid panel     Status: None   Collection Time: 03/16/22  2:39 PM  Result Value Ref Range   Cholesterol 138 0 - 200 mg/dL    Comment: ATP III Classification       Desirable:  < 200 mg/dL               Borderline High:  200 - 239 mg/dL          High:  > = 240 mg/dL   Triglycerides 127.0 0.0 - 149.0 mg/dL    Comment: Normal:  <150 mg/dLBorderline High:  150 - 199 mg/dL   HDL 53.50 >39.00 mg/dL   VLDL 25.4 0.0 - 40.0 mg/dL   LDL Cholesterol 59 0 - 99 mg/dL   Total CHOL/HDL Ratio 3     Comment:                Men          Women1/2 Average Risk     3.4          3.3Average Risk          5.0          4.42X Average Risk          9.6          7.13X Average Risk          15.0          11.0                       NonHDL 84.62     Comment: NOTE:  Non-HDL goal should be 30 mg/dL higher than patient's LDL goal (i.e. LDL goal of < 70 mg/dL, would have non-HDL goal of < 100 mg/dL)  Hemoglobin A1c     Status: Abnormal   Collection Time: 03/16/22  2:39 PM  Result Value Ref Range   Hgb A1c MFr Bld 6.7 (H) 4.6 - 6.5 %    Comment: Glycemic Control Guidelines for People with Diabetes:Non Diabetic:  <6%Goal of Therapy: <7%Additional Action Suggested:  >8%   CBC     Status: Abnormal   Collection Time: 03/16/22  2:39 PM  Result Value Ref Range   WBC 7.6 4.0 - 10.5 K/uL   RBC 4.87 3.87 - 5.11 Mil/uL   Platelets 187.0 150.0 - 400.0 K/uL   Hemoglobin 15.8 (H) 12.0 - 15.0 g/dL   HCT 47.6 (H) 36.0 - 46.0 %   MCV 97.7 78.0 -  100.0 fl   MCHC 33.2 30.0 - 36.0 g/dL   RDW 13.3 11.5 - 15.5 %    Radiology No results found.  Assessment/Plan Chronic kidney disease, stage 3a Can exacerbate LE swelling.   Peripheral neuropathy Can worsen LE swelling and symptoms.   Varicose veins of leg with swelling, left Patient has had good response to conservative therapy with compression socks, elevation, and activity and no intervention is necessary at this time.  She can follow-up in 1 year.  Leotis Pain, MD  04/07/2022 2:40 PM    This note was created with Dragon medical transcription system.  Any errors from dictation are purely unintentional

## 2022-04-09 ENCOUNTER — Encounter: Payer: Self-pay | Admitting: Family Medicine

## 2022-04-13 ENCOUNTER — Encounter (INDEPENDENT_AMBULATORY_CARE_PROVIDER_SITE_OTHER): Payer: Self-pay

## 2022-04-22 DIAGNOSIS — L989 Disorder of the skin and subcutaneous tissue, unspecified: Secondary | ICD-10-CM | POA: Diagnosis not present

## 2022-04-22 DIAGNOSIS — L905 Scar conditions and fibrosis of skin: Secondary | ICD-10-CM | POA: Diagnosis not present

## 2022-04-22 DIAGNOSIS — D0339 Melanoma in situ of other parts of face: Secondary | ICD-10-CM | POA: Diagnosis not present

## 2022-04-27 ENCOUNTER — Encounter: Payer: Self-pay | Admitting: *Deleted

## 2022-04-27 ENCOUNTER — Encounter: Payer: Medicare HMO | Attending: Family Medicine | Admitting: *Deleted

## 2022-04-27 VITALS — BP 110/62 | Ht 64.0 in | Wt 140.4 lb

## 2022-04-27 DIAGNOSIS — E1122 Type 2 diabetes mellitus with diabetic chronic kidney disease: Secondary | ICD-10-CM | POA: Diagnosis not present

## 2022-04-27 DIAGNOSIS — N1831 Chronic kidney disease, stage 3a: Secondary | ICD-10-CM | POA: Diagnosis not present

## 2022-04-27 DIAGNOSIS — E119 Type 2 diabetes mellitus without complications: Secondary | ICD-10-CM

## 2022-04-27 NOTE — Progress Notes (Signed)
Diabetes Self-Management Education  Visit Type: First/Initial  Appt. Start Time: 1445 Appt. End Time: 2440  04/27/2022  [name], identified by name and date of birth, is a 76 y.o. female with a diagnosis of Diabetes: Type 2.   ASSESSMENT  Blood pressure 110/62, height '5\' 4"'$  (1.626 m), weight 140 lb 6.4 oz (63.7 kg), last menstrual period 09/13/2000. Body mass index is 24.1 kg/m.   Diabetes Self-Management Education - 04/27/22 1606       Visit Information   Visit Type First/Initial      Initial Visit   Diabetes Type Type 2    Date Diagnosed 6 weeks ago    Are you currently following a meal plan? No    Are you taking your medications as prescribed? Yes      Health Coping   How would you rate your overall health? Fair      Psychosocial Assessment   Patient Belief/Attitude about Diabetes Other (comment)   "need more info before I feel anything about it"   What is the hardest part about your diabetes right now, causing you the most concern, or is the most worrisome to you about your diabetes?   Making healty food and beverage choices    Self-care barriers None    Self-management support Doctor's office;Family    Other persons present Spouse/SO    Patient Concerns Nutrition/Meal planning;Glycemic Control;Monitoring;Healthy Lifestyle    Special Needs None    Preferred Learning Style Auditory;Visual    Learning Readiness Ready    How often do you need to have someone help you when you read instructions, pamphlets, or other written materials from your doctor or pharmacy? 1 - Never    What is the last grade level you completed in school? AA degree      Pre-Education Assessment   Patient understands the diabetes disease and treatment process. Needs Instruction    Patient understands incorporating nutritional management into lifestyle. Needs Instruction    Patient undertands incorporating physical activity into lifestyle. Needs Review    Patient understands using  medications safely. Needs Instruction    Patient understands monitoring blood glucose, interpreting and using results Needs Instruction    Patient understands prevention, detection, and treatment of acute complications. Needs Instruction    Patient understands prevention, detection, and treatment of chronic complications. Needs Instruction    Patient understands how to develop strategies to address psychosocial issues. Needs Instruction    Patient understands how to develop strategies to promote health/change behavior. Needs Instruction      Complications   Last HgB A1C per patient/outside source 6.7 %   03/16/2022   How often do you check your blood sugar? Patient declines   BG in the office was 122 mg/dL at 3:35 pm - 3 hrs pp.   Have you had a dilated eye exam in the past 12 months? Yes    Have you had a dental exam in the past 12 months? Yes    Are you checking your feet? No      Dietary Intake   Breakfast oatmeal and walnuts; cereal with unsweetened almond milk and walnuts    Lunch homemade or Progresso soup - low sodium with crackers; smoothie with low fat yogurt, fruit - berries, peanut butter, flax seed, vanilla; [place] sandwich with cheese    Snack (afternoon) apple with peanut butter - also eats bananas, berries, cantaloup    Dinner chicken, occasional pork and fish (salmon), rarely beef; potatoes, beans, rice, occasional pasta, broccoli,  spinach, squash, zucchini; salads with lettuce, tomatoes, feta cheese, onions, low fat dressing    Beverage(s) water, diet cranberry juice, V8 juice      Activity / Exercise   Activity / Exercise Type Light (walking / raking leaves)   Silver Sneakers   How many days per week do you exercise? 3    How many minutes per day do you exercise? 45    Total minutes per week of exercise 135      Patient Education   Previous Diabetes Education No    Disease Pathophysiology Definition of diabetes, type 1 and 2, and the diagnosis of diabetes;Factors that  contribute to the development of diabetes    Healthy Eating Role of diet in the treatment of diabetes and the relationship between the three main macronutrients and blood glucose level;Food label reading, portion sizes and measuring food.    Being Active Role of exercise on diabetes management, blood pressure control and cardiac health.    Monitoring Identified appropriate SMBG and/or A1C goals.    Chronic complications Relationship between chronic complications and blood glucose control    Diabetes Stress and Support Identified and addressed patients feelings and concerns about diabetes;Role of stress on diabetes      Individualized Goals (developed by patient)   Reducing Risk Other (comment)   improve blood sugars, prevent diabetes complications, lead a healthier lifestyle     Outcomes   Expected Outcomes Demonstrated interest in learning. Expect positive outcomes    Program Status Not Completed         Individualized Plan for Diabetes Self-Management Training:   Learning Objective:  Patient will have a greater understanding of diabetes self-management. Patient education plan is to attend individual and/or group sessions per assessed needs and concerns.   Plan:   Patient Instructions  Exercise: Continue Silver Sneakers for    45  minutes   3  days a week  Eat 3 meals day,   1-2  snacks a day Space meals 4-6 hours apart Include 1 serving of protein when eating fruit for a snack  Call back if you want to schedule an appointment with the nurse or dietitian or attend classes  Expected Outcomes:  Demonstrated interest in learning. Expect positive outcomes  Education material provided:  General Meal Planning Guidelines Simple Meal Plan Carb-Mindful Smoothie Handout  If problems or questions, patient to contact team via:   Johny Drilling, RN, [place], [place] [phone]  Future DSME appointment: PRN

## 2022-04-27 NOTE — Patient Instructions (Addendum)
1445Exercise: Continue Silver Sneakers for    45  minutes   3  days a week  Eat 3 meals day,   1-2  snacks a day Space meals 4-6 hours apart Include 1 serving of protein when eating fruit for a snack  Call back if you want to schedule an appointment with the nurse or dietitian or attend classes

## 2022-04-28 ENCOUNTER — Telehealth: Payer: Self-pay | Admitting: Family Medicine

## 2022-04-28 NOTE — Telephone Encounter (Signed)
Pt has [employer] and has not been able to add Dr. Diona Browner as her PCP. Insurance does not have Dr. Diona Browner listed.  Contacted credentialing and she has escalated. Will continue to check for updates.

## 2022-04-28 NOTE — Telephone Encounter (Signed)
Pt called asking to speak to Mickel Baas or Amy directly. Pt wouldn't say what she needed to speak about. Requested a call back @ [phone]

## 2022-05-12 DIAGNOSIS — Z08 Encounter for follow-up examination after completed treatment for malignant neoplasm: Secondary | ICD-10-CM | POA: Diagnosis not present

## 2022-05-12 DIAGNOSIS — Z86006 Personal history of melanoma in-situ: Secondary | ICD-10-CM | POA: Diagnosis not present

## 2022-05-12 DIAGNOSIS — L309 Dermatitis, unspecified: Secondary | ICD-10-CM | POA: Diagnosis not present

## 2022-05-22 NOTE — Telephone Encounter (Signed)
Noted  

## 2022-06-05 DIAGNOSIS — C4339 Malignant melanoma of other parts of face: Secondary | ICD-10-CM | POA: Diagnosis not present

## 2022-06-05 DIAGNOSIS — L923 Foreign body granuloma of the skin and subcutaneous tissue: Secondary | ICD-10-CM | POA: Diagnosis not present

## 2022-06-05 DIAGNOSIS — Z08 Encounter for follow-up examination after completed treatment for malignant neoplasm: Secondary | ICD-10-CM | POA: Diagnosis not present

## 2022-06-05 DIAGNOSIS — Z85820 Personal history of malignant melanoma of skin: Secondary | ICD-10-CM | POA: Diagnosis not present

## 2022-07-14 DIAGNOSIS — C4339 Malignant melanoma of other parts of face: Secondary | ICD-10-CM | POA: Diagnosis not present

## 2022-07-14 DIAGNOSIS — D2371 Other benign neoplasm of skin of right lower limb, including hip: Secondary | ICD-10-CM | POA: Diagnosis not present

## 2022-07-14 DIAGNOSIS — L814 Other melanin hyperpigmentation: Secondary | ICD-10-CM | POA: Diagnosis not present

## 2022-07-14 DIAGNOSIS — Z08 Encounter for follow-up examination after completed treatment for malignant neoplasm: Secondary | ICD-10-CM | POA: Diagnosis not present

## 2022-07-14 DIAGNOSIS — L821 Other seborrheic keratosis: Secondary | ICD-10-CM | POA: Diagnosis not present

## 2022-07-14 DIAGNOSIS — Z85820 Personal history of malignant melanoma of skin: Secondary | ICD-10-CM | POA: Diagnosis not present

## 2022-08-14 ENCOUNTER — Other Ambulatory Visit (HOSPITAL_COMMUNITY): Payer: Self-pay

## 2022-08-26 DIAGNOSIS — H40053 Ocular hypertension, bilateral: Secondary | ICD-10-CM | POA: Diagnosis not present

## 2022-09-03 DIAGNOSIS — R251 Tremor, unspecified: Secondary | ICD-10-CM | POA: Diagnosis not present

## 2022-09-03 DIAGNOSIS — G629 Polyneuropathy, unspecified: Secondary | ICD-10-CM | POA: Diagnosis not present

## 2022-09-03 DIAGNOSIS — R2689 Other abnormalities of gait and mobility: Secondary | ICD-10-CM | POA: Diagnosis not present

## 2022-09-03 DIAGNOSIS — I872 Venous insufficiency (chronic) (peripheral): Secondary | ICD-10-CM | POA: Diagnosis not present

## 2022-09-03 DIAGNOSIS — M533 Sacrococcygeal disorders, not elsewhere classified: Secondary | ICD-10-CM | POA: Diagnosis not present

## 2022-09-09 ENCOUNTER — Other Ambulatory Visit: Payer: Self-pay

## 2022-09-09 DIAGNOSIS — F325 Major depressive disorder, single episode, in full remission: Secondary | ICD-10-CM

## 2022-09-09 MED ORDER — SERTRALINE HCL 50 MG PO TABS: ORAL_TABLET | ORAL | 0 refills | Status: DC

## 2022-09-09 NOTE — Telephone Encounter (Signed)
Prescription Request  09/09/2022  LOV: 03/16/2022 with Dr Einar Pheasant  Pt has TOC scheduled with Dr Diona Browner on 09/22/22  What is the name of the medication or equipment? Sertraline 50 mg Last refilled sertraline 50 mg # 135 x 1 on 03/16/22.  Have you contacted your pharmacy to request a refill? Yes   Which pharmacy would you like this sent to?   [facility], [place] [address] Phone: [phone] Fax: [phone]    Patient notified that their request is being sent to the clinical staff for review and that they should receive a response within 2 business days.   Please advise at  [place].   Sending request to [facility] FNP; thank you.

## 2022-09-22 ENCOUNTER — Encounter: Payer: Medicare HMO | Admitting: Family Medicine

## 2022-09-29 IMAGING — CT CT HEAD W/O CM
4 series · 16 of 47 positions shown, 18 images · non-contrast
Comparison: None.

CLINICAL DATA: Fall

EXAM:
CT HEAD WITHOUT CONTRAST
CT CERVICAL SPINE WITHOUT CONTRAST
TECHNIQUE: Multidetector CT imaging of the head and cervical spine was
performed following the standard protocol without intravenous
contrast. Multiplanar CT image reconstructions of the cervical spine
were also generated.

[Series 3: head bone · axial · 0.42mm/px · z∈[+491,+521]mm · 3 of 76 slices shown]
[im 8/76  bone]
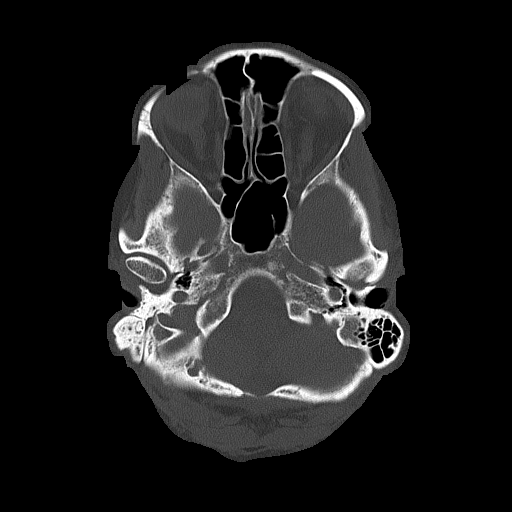
[im 16/76  bone]
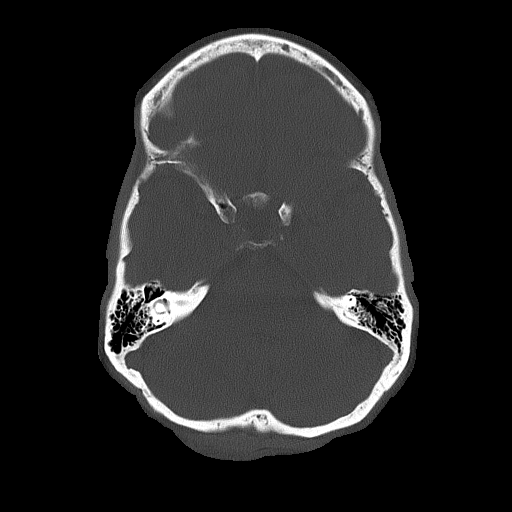
[im 23/76  bone]
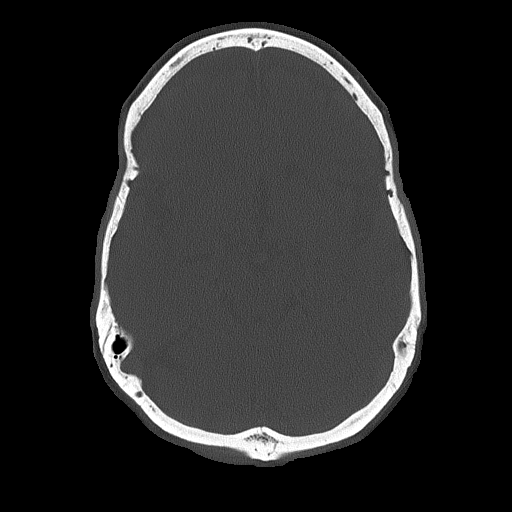

[Series 4: head wo · axial · 0.42mm/px · z∈[+492,+607]mm · 7 of 31 slices shown, 9 images]
[im 4/31  brain]
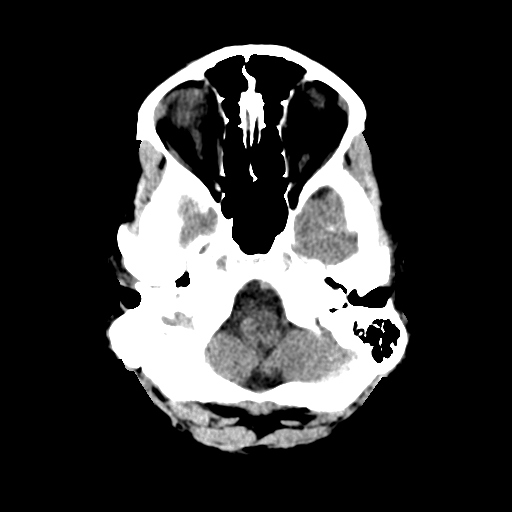
[im 4/31  bone]
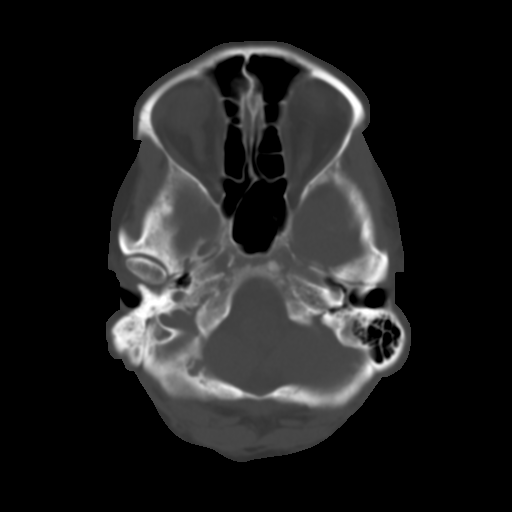
[im 8/31  brain]
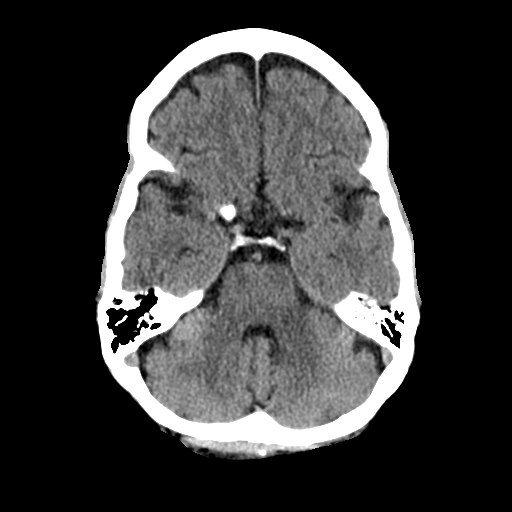
[im 12/31  brain]
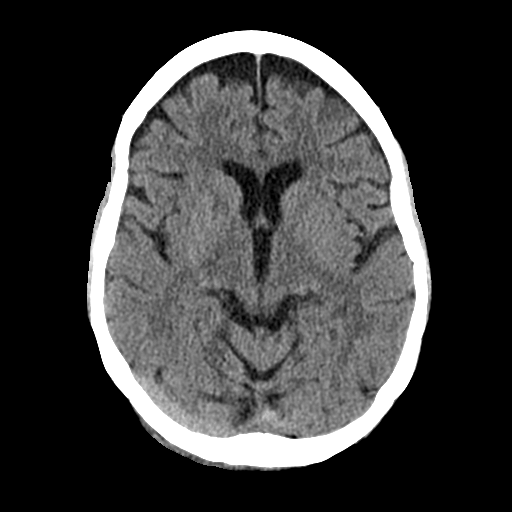
[im 16/31  brain]
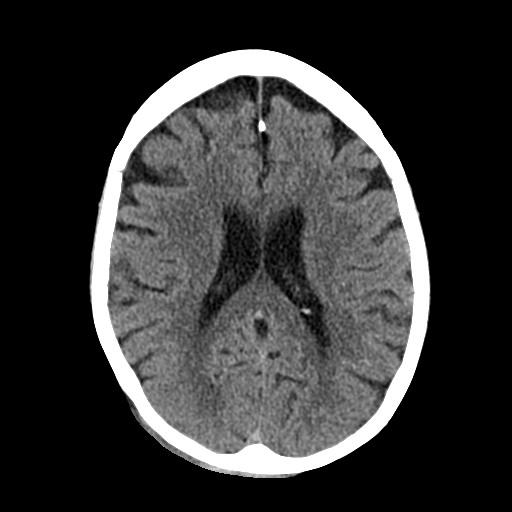
[im 19/31  brain]
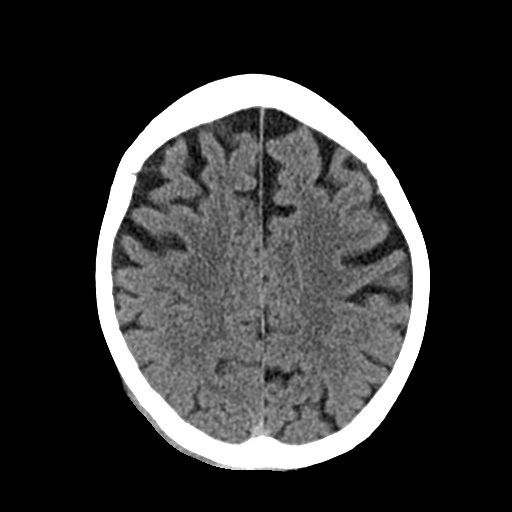
[im 19/31  bone]
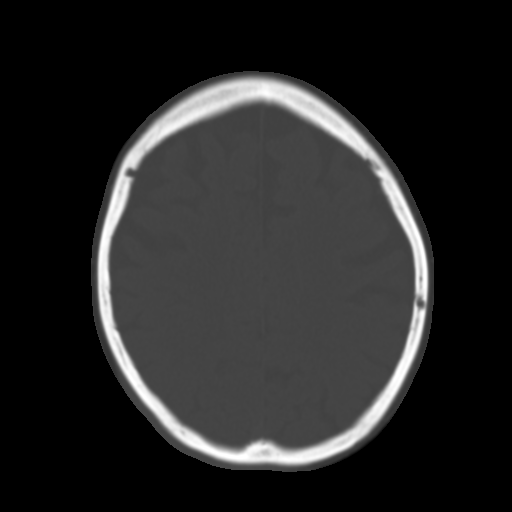
[im 23/31  brain]
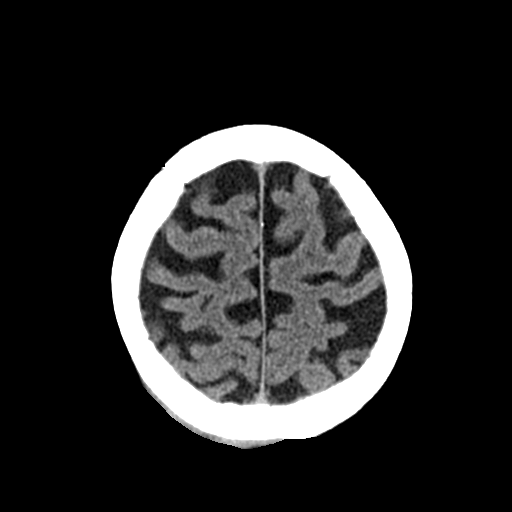
[im 27/31  brain]
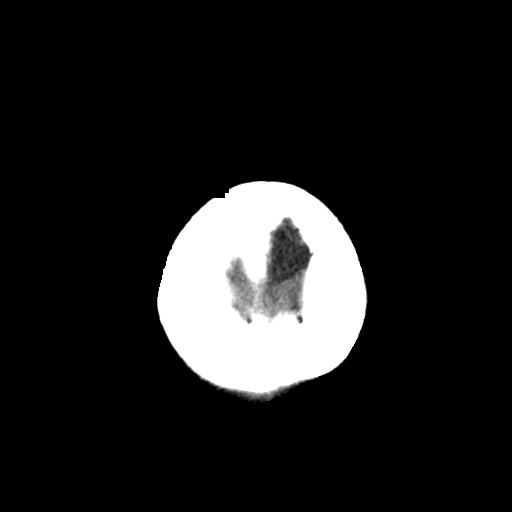

[Series 5: coronal soft tissue · coronal · 0.32mm/px · 3 of 65 slices shown]
[im 22/65  brain]
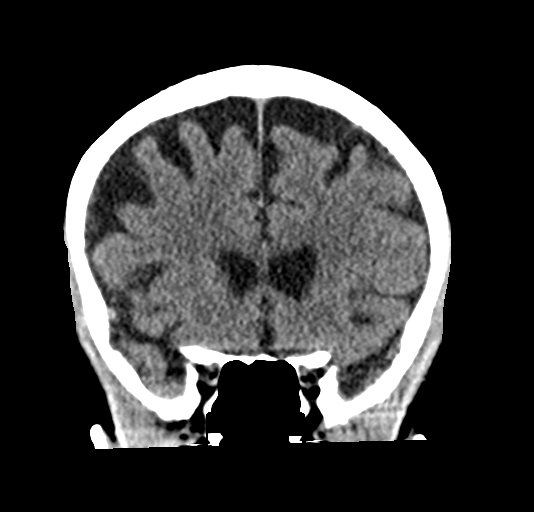
[im 29/65  brain]
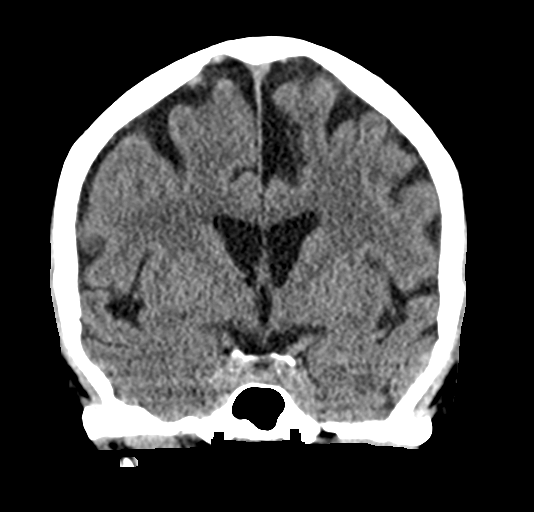
[im 36/65  brain]
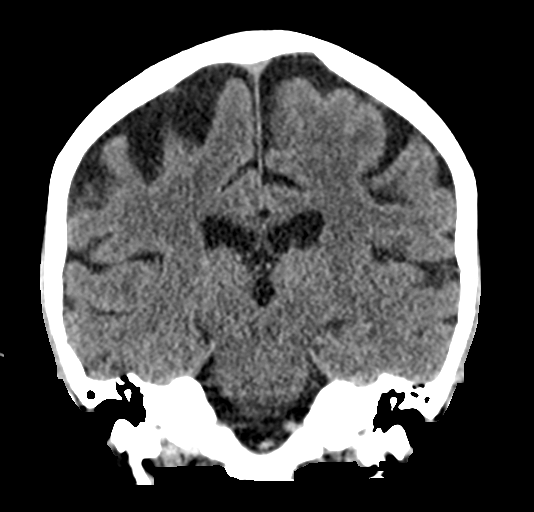

[Series 6: sagittal soft tissue · sagittal · 0.32mm/px · 3 of 53 slices shown]
[im 18/53  brain]
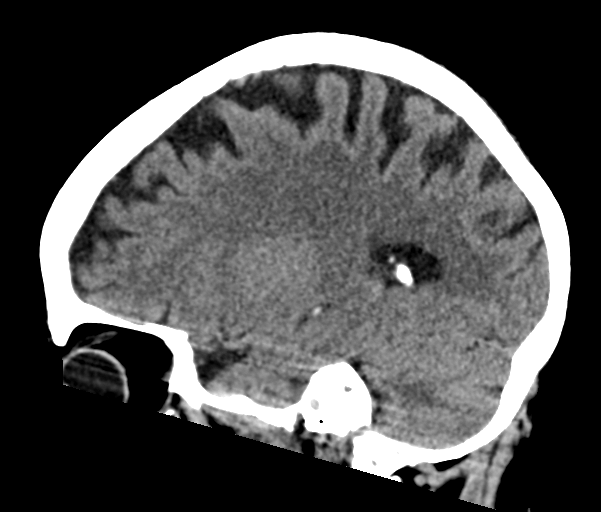
[im 27/53  brain]
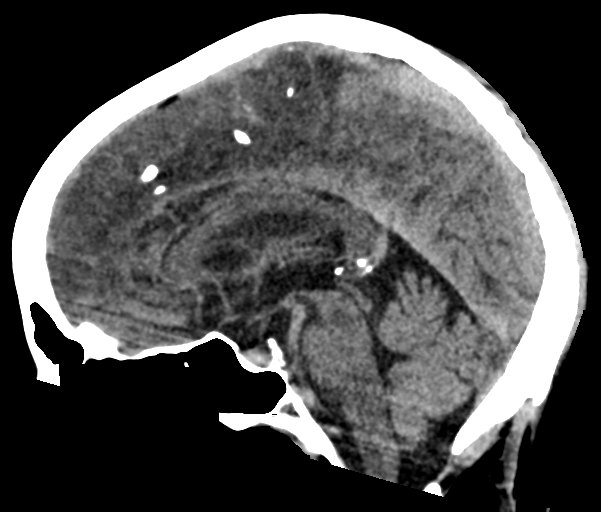
[im 35/53  brain]
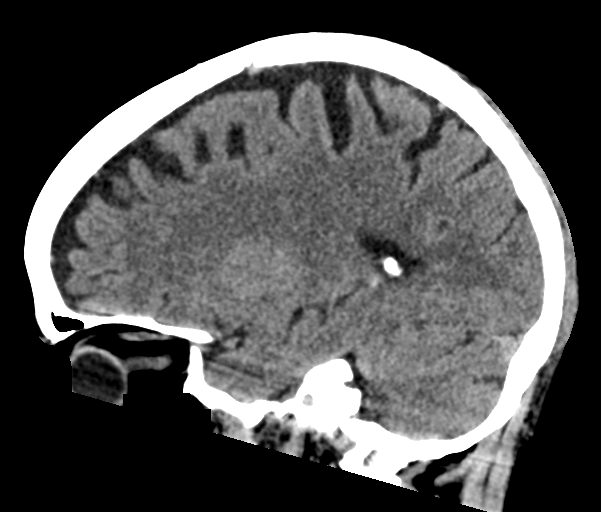

[16 of 47 positions shown; findings below may reference images not displayed]

FINDINGS: CT HEAD FINDINGS

Brain:

No evidence of large-territorial acute infarction. No parenchymal
hemorrhage. No mass lesion. No extra-axial collection.

No mass effect or midline shift. No hydrocephalus. Basilar cisterns
are patent.

Vascular: No hyperdense vessel. Atherosclerotic calcifications are
present within the cavernous internal carotid arteries.

Skull: No acute fracture or focal lesion.

Sinuses/Orbits: Paranasal sinuses and mastoid air cells are clear.
The orbits are unremarkable.

Other: Right scalp trace hematoma formation.

CT CERVICAL SPINE FINDINGS

Alignment: Grade 1 anterolisthesis of C4 on C5, C5 on C6.

Skull base and vertebrae: No acute fracture. No aggressive appearing
focal osseous lesion or focal pathologic process.

Soft tissues and spinal canal: No prevertebral fluid or swelling. No
visible canal hematoma.

Upper chest: Trace biapical pleural/pulmonary scarring.

Other: None.
IMPRESSION: 1. No acute intracranial abnormality.
2. No acute displaced fracture or traumatic listhesis of the
cervical spine.

## 2022-09-29 IMAGING — CT CT CERVICAL SPINE W/O CM
3 of 4 series · 13 of 33 positions shown, 16 images · non-contrast
Comparison: None.

CLINICAL DATA: Fall

EXAM:
CT HEAD WITHOUT CONTRAST
CT CERVICAL SPINE WITHOUT CONTRAST
TECHNIQUE: Multidetector CT imaging of the head and cervical spine was
performed following the standard protocol without intravenous
contrast. Multiplanar CT image reconstructions of the cervical spine
were also generated.

[Series 6: sagittal bone · sagittal · 0.29mm/px · 5 of 51 slices shown, 6 images]
[im 17/51  bone]
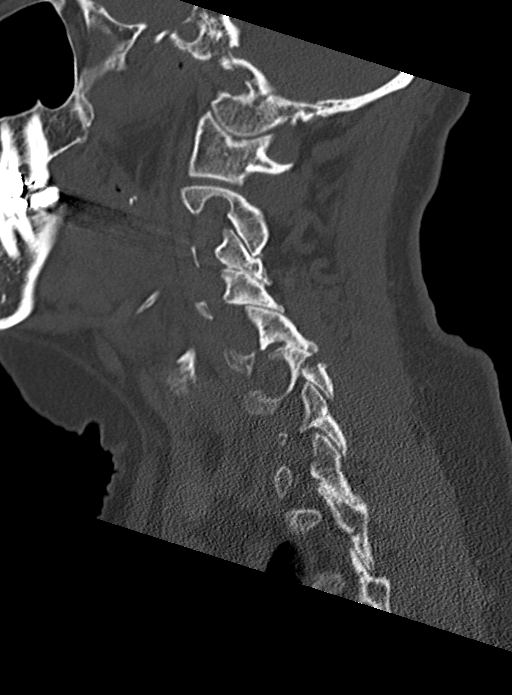
[im 21/51  bone]
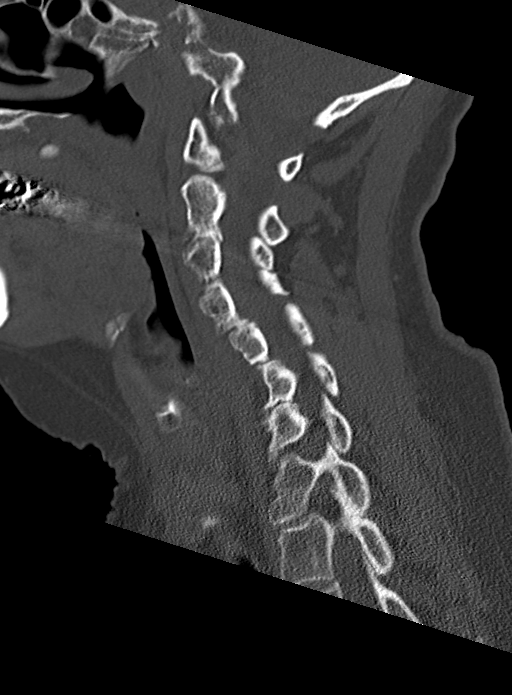
[im 26/51  soft-tissue]
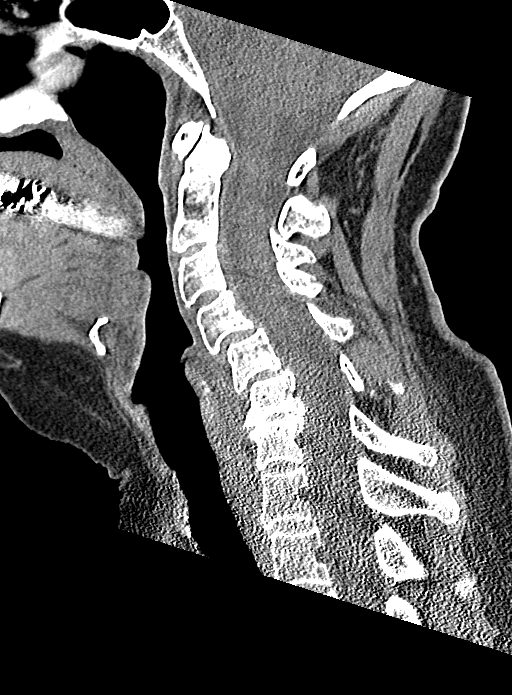
[im 26/51  bone]
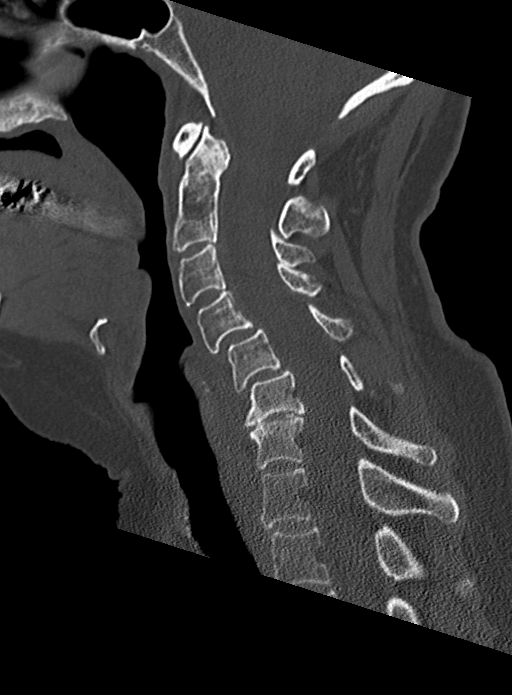
[im 30/51  bone]
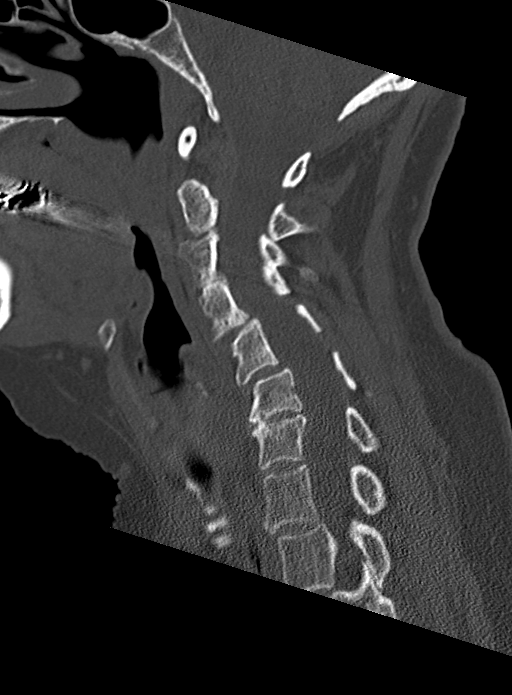
[im 34/51  bone]
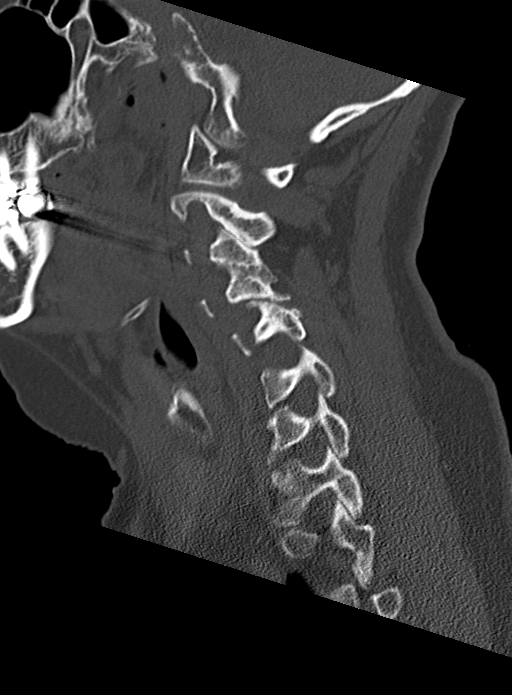

[Series 7: coronal bone · coronal · 0.28mm/px · 3 of 67 slices shown]
[im 15/67  bone]
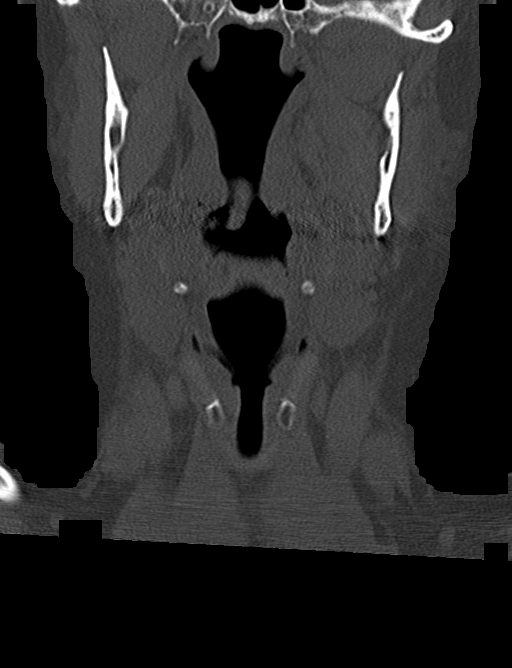
[im 27/67  bone]
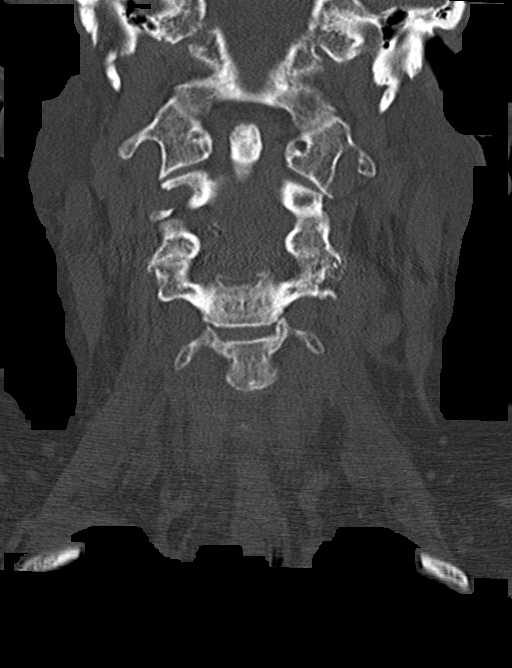
[im 40/67  bone]
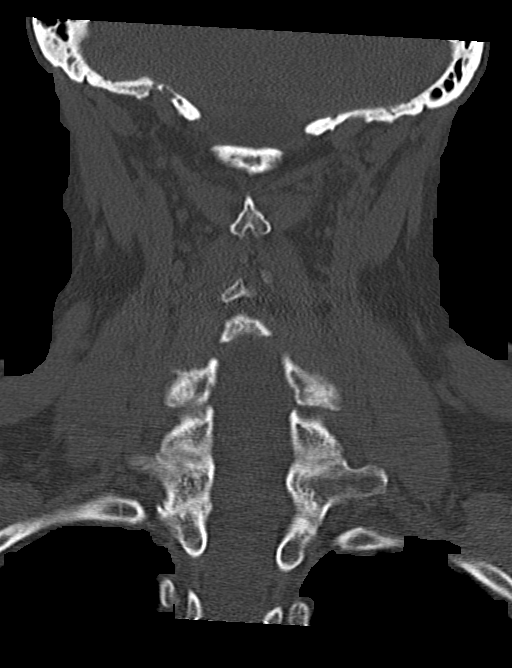

[Series 8: orthogonal bone · axial · 0.26mm/px · z∈[+321,+440]mm · 5 of 92 slices shown, 7 images]
[im 14/92  soft-tissue]
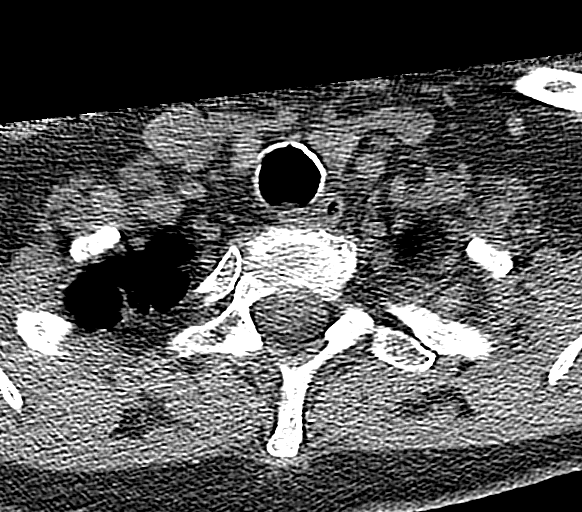
[im 14/92  bone]
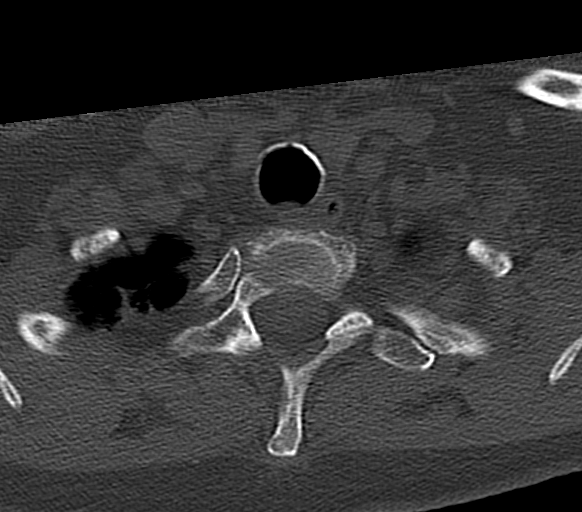
[im 27/92  bone]
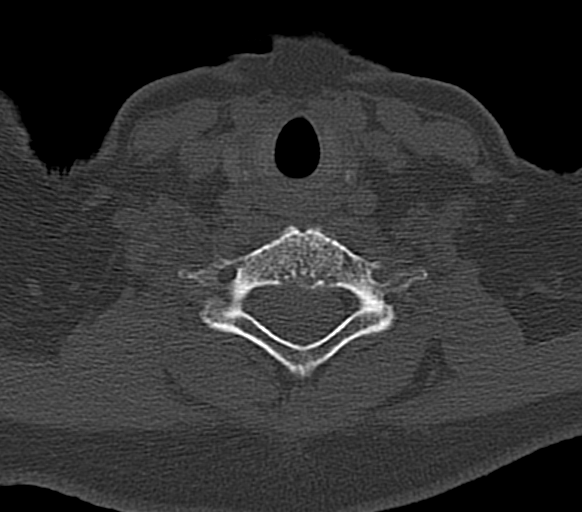
[im 53/92  bone]
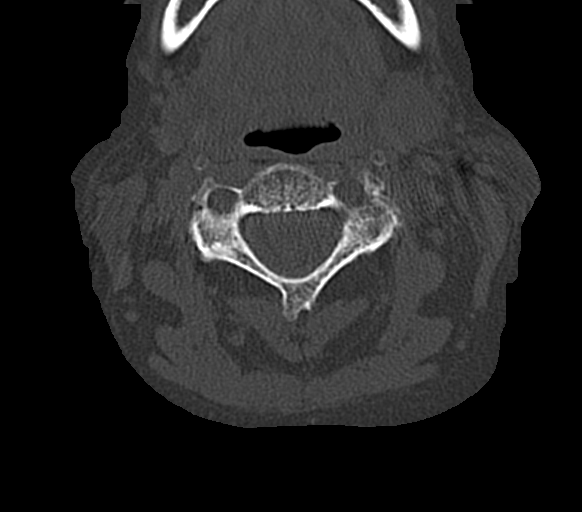
[im 66/92  bone]
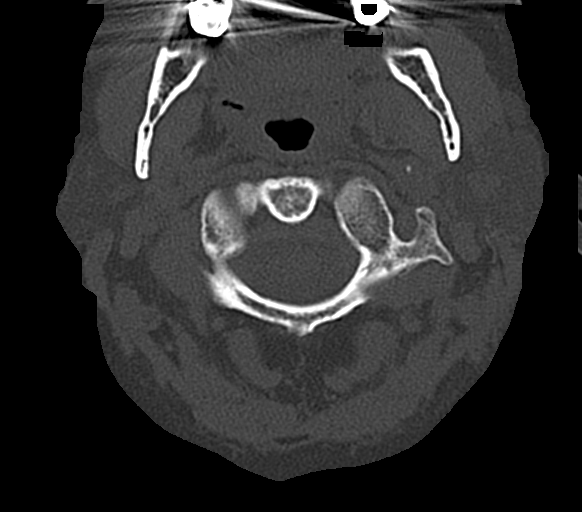
[im 79/92  soft-tissue]
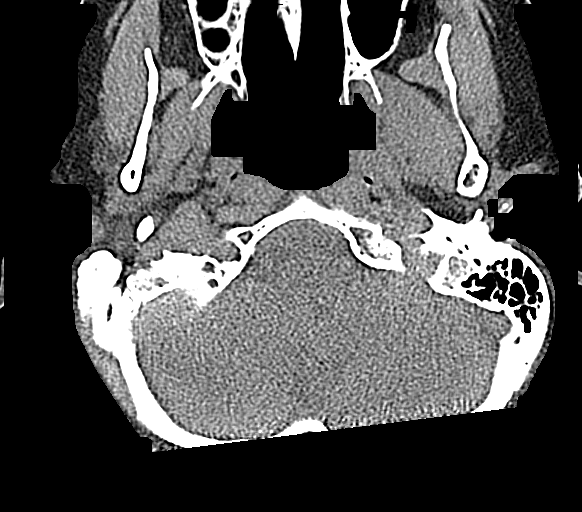
[im 79/92  bone]
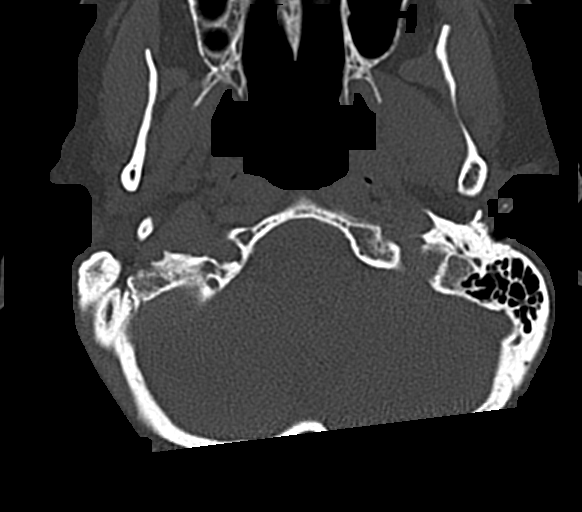

[13 of 33 positions shown; findings below may reference images not displayed]

FINDINGS: CT HEAD FINDINGS

Brain:

No evidence of large-territorial acute infarction. No parenchymal
hemorrhage. No mass lesion. No extra-axial collection.

No mass effect or midline shift. No hydrocephalus. Basilar cisterns
are patent.

Vascular: No hyperdense vessel. Atherosclerotic calcifications are
present within the cavernous internal carotid arteries.

Skull: No acute fracture or focal lesion.

Sinuses/Orbits: Paranasal sinuses and mastoid air cells are clear.
The orbits are unremarkable.

Other: Right scalp trace hematoma formation.

CT CERVICAL SPINE FINDINGS

Alignment: Grade 1 anterolisthesis of C4 on C5, C5 on C6.

Skull base and vertebrae: No acute fracture. No aggressive appearing
focal osseous lesion or focal pathologic process.

Soft tissues and spinal canal: No prevertebral fluid or swelling. No
visible canal hematoma.

Upper chest: Trace biapical pleural/pulmonary scarring.

Other: None.
IMPRESSION: 1. No acute intracranial abnormality.
2. No acute displaced fracture or traumatic listhesis of the
cervical spine.

## 2022-11-22 ENCOUNTER — Other Ambulatory Visit: Payer: Self-pay | Admitting: Family

## 2022-11-22 DIAGNOSIS — F325 Major depressive disorder, single episode, in full remission: Secondary | ICD-10-CM

## 2022-11-26 ENCOUNTER — Encounter: Payer: Self-pay | Admitting: Internal Medicine

## 2022-11-27 ENCOUNTER — Other Ambulatory Visit: Payer: Self-pay | Admitting: Internal Medicine

## 2022-12-21 DIAGNOSIS — Z1231 Encounter for screening mammogram for malignant neoplasm of breast: Secondary | ICD-10-CM | POA: Diagnosis not present

## 2022-12-22 LAB — HM MAMMOGRAPHY

## 2022-12-23 ENCOUNTER — Encounter: Payer: Self-pay | Admitting: Family Medicine

## 2023-01-05 ENCOUNTER — Encounter: Payer: Self-pay | Admitting: Family Medicine

## 2023-01-05 ENCOUNTER — Ambulatory Visit (INDEPENDENT_AMBULATORY_CARE_PROVIDER_SITE_OTHER): Payer: Medicare HMO | Admitting: Family Medicine

## 2023-01-05 VITALS — BP 122/72 | HR 72 | Temp 98.5°F | Ht 64.0 in | Wt 142.0 lb

## 2023-01-05 DIAGNOSIS — E782 Mixed hyperlipidemia: Secondary | ICD-10-CM | POA: Diagnosis not present

## 2023-01-05 DIAGNOSIS — G250 Essential tremor: Secondary | ICD-10-CM

## 2023-01-05 DIAGNOSIS — D751 Secondary polycythemia: Secondary | ICD-10-CM | POA: Diagnosis not present

## 2023-01-05 DIAGNOSIS — I83892 Varicose veins of left lower extremities with other complications: Secondary | ICD-10-CM

## 2023-01-05 DIAGNOSIS — F331 Major depressive disorder, recurrent, moderate: Secondary | ICD-10-CM | POA: Diagnosis not present

## 2023-01-05 DIAGNOSIS — I491 Atrial premature depolarization: Secondary | ICD-10-CM

## 2023-01-05 DIAGNOSIS — E1169 Type 2 diabetes mellitus with other specified complication: Secondary | ICD-10-CM

## 2023-01-05 DIAGNOSIS — G4733 Obstructive sleep apnea (adult) (pediatric): Secondary | ICD-10-CM

## 2023-01-05 DIAGNOSIS — M8589 Other specified disorders of bone density and structure, multiple sites: Secondary | ICD-10-CM

## 2023-01-05 DIAGNOSIS — G8929 Other chronic pain: Secondary | ICD-10-CM

## 2023-01-05 DIAGNOSIS — N1831 Chronic kidney disease, stage 3a: Secondary | ICD-10-CM

## 2023-01-05 DIAGNOSIS — M545 Low back pain, unspecified: Secondary | ICD-10-CM

## 2023-01-05 DIAGNOSIS — G6289 Other specified polyneuropathies: Secondary | ICD-10-CM

## 2023-01-05 DIAGNOSIS — E1122 Type 2 diabetes mellitus with diabetic chronic kidney disease: Secondary | ICD-10-CM

## 2023-01-05 DIAGNOSIS — E785 Hyperlipidemia, unspecified: Secondary | ICD-10-CM

## 2023-01-05 DIAGNOSIS — C439 Malignant melanoma of skin, unspecified: Secondary | ICD-10-CM

## 2023-01-05 DIAGNOSIS — I872 Venous insufficiency (chronic) (peripheral): Secondary | ICD-10-CM

## 2023-01-05 LAB — HM DIABETES FOOT EXAM

## 2023-01-05 MED ORDER — ATORVASTATIN CALCIUM 10 MG PO TABS
10.0000 mg | ORAL_TABLET | Freq: Every day | ORAL | 3 refills | Status: DC
Start: 2023-01-05 — End: 2023-10-27

## 2023-01-05 NOTE — Assessment & Plan Note (Signed)
GFR 57

## 2023-01-05 NOTE — Assessment & Plan Note (Signed)
Continue vitamin d and calcium.

## 2023-01-05 NOTE — Assessment & Plan Note (Signed)
Chronic swelling in left leg.

## 2023-01-05 NOTE — Assessment & Plan Note (Addendum)
Chronic, stable on zoloft 75 mg.   She is considering weaning off this medication as it is well controlled.

## 2023-01-05 NOTE — Progress Notes (Signed)
Patient ID: Dana Massey, female    DOB: 1946-05-18, 77 y.o.   MRN: [id number]  This visit was conducted in person.  BP 122/72 (BP Location: Left Arm, Patient Position: Sitting, Cuff Size: Normal)   Pulse 72   Temp 98.5 F (36.9 C) (Temporal)   Ht 5\' 4"  (1.626 m)   Wt 142 lb (64.4 kg)   LMP 09/13/2000   SpO2 94%   BMI 24.37 kg/m    CC:  Chief Complaint  Patient presents with   Establish Care    Subjective:   HPI: Dana Massey is a 77 y.o. female presenting on 01/05/2023 for Establish Care  Previous PCP: [facility] Last AMW 03/2022 Reviewed in detail  ,: History of melena when followed by dermatology PA Dareen Piano  Osteopenia: On vitamin D and calcium  Major depressive disorder: Well-controlled on sertraline 50 mg p.o. daily   Hyperlipidemia: LDL at goal  < 100 on atorvastatin 10 mg p.o. daily at last check Lab Results  Component Value Date   CHOL 138 03/16/2022   HDL 53.50 03/16/2022   LDLCALC 59 03/16/2022   LDLDIRECT 126.9 11/25/2012   TRIG 127.0 03/16/2022   CHOLHDL 3 03/16/2022   New diagnosis diabetes made at last labs March 16, 2022 due for reevaluation Referral placed to nutritionist at that time Lab Results  Component Value Date   HGBA1C 6.7 (H) 03/16/2022   Wt Readings from Last 3 Encounters:  01/05/23 142 lb (64.4 kg)  04/27/22 140 lb 6.4 oz (63.7 kg)  04/07/22 140 lb 9.6 oz (63.8 kg)     OSA:  Stable using CPAP      Relevant past medical, surgical, family and social history reviewed and updated as indicated. Interim medical history since our last visit reviewed. Allergies and medications reviewed and updated. Outpatient Medications Prior to Visit  Medication Sig Dispense Refill   Acetaminophen 500 MG coapsule Take 50 mg by mouth every 6 (six) hours as needed.     aspirin EC 81 MG tablet Take 81 mg by mouth daily.     Calcium-Vitamin D-Vitamin K 500-100-40 MG-UNT-MCG CHEW Chew 1 capsule by mouth daily.       dorzolamide-timolol  (COSOPT) 22.3-6.8 MG/ML ophthalmic solution Place 1 drop into both eyes in the morning and at bedtime.     fish oil-omega-3 fatty acids 1000 MG capsule Take 2 g by mouth daily.       gabapentin (NEURONTIN) 400 MG capsule Take 400 mg by mouth at bedtime.     latanoprost (XALATAN) 0.005 % ophthalmic solution Place 1 drop into both eyes at bedtime.     MELATONIN PO Take 6 mg by mouth at bedtime as needed.      metoprolol succinate (TOPROL-XL) 25 MG 24 hr tablet TAKE 1 TABLET DAILY WITH OR IMMEDIATELY FOLLOWING A MEAL. 90 tablet 1   Multiple Vitamins-Minerals (MULTIVITAMIN WITH MINERALS) tablet Take 1 tablet by mouth daily.       polyethylene glycol powder (GLYCOLAX/MIRALAX) 17 GM/SCOOP powder Take 17 g by mouth daily as needed.      sertraline (ZOLOFT) 50 MG tablet TAKE 1 AND 1/2 TABLETS EVERY DAY 135 tablet 0   atorvastatin (LIPITOR) 10 MG tablet TAKE 1 TABLET EVERY DAY (NEEDS PHYSICAL IN SEPTEMBER) 90 tablet 3   No facility-administered medications prior to visit.     Per HPI unless specifically indicated in ROS section below Review of Systems Objective:  BP 122/72 (BP Location: Left Arm, Patient Position: Sitting, Cuff Size:  Normal)   Pulse 72   Temp 98.5 F (36.9 C) (Temporal)   Ht 5\' 4"  (1.626 m)   Wt 142 lb (64.4 kg)   LMP 09/13/2000   SpO2 94%   BMI 24.37 kg/m   Wt Readings from Last 3 Encounters:  01/05/23 142 lb (64.4 kg)  04/27/22 140 lb 6.4 oz (63.7 kg)  04/07/22 140 lb 9.6 oz (63.8 kg)      Physical Exam Constitutional:      General: She is not in acute distress.    Appearance: Normal appearance. She is well-developed. She is not ill-appearing or toxic-appearing.  HENT:     Head: Normocephalic.     Right Ear: Hearing, tympanic membrane, ear canal and external ear normal. Tympanic membrane is not erythematous, retracted or bulging.     Left Ear: Hearing, tympanic membrane, ear canal and external ear normal. Tympanic membrane is not erythematous, retracted or bulging.      Nose: No mucosal edema or rhinorrhea.     Right Sinus: No maxillary sinus tenderness or frontal sinus tenderness.     Left Sinus: No maxillary sinus tenderness or frontal sinus tenderness.     Mouth/Throat:     Mouth: Oropharynx is clear and moist and mucous membranes are normal.     Pharynx: Uvula midline.  Eyes:     General: Lids are normal. Lids are everted, no foreign bodies appreciated.     Extraocular Movements: EOM normal.     Conjunctiva/sclera: Conjunctivae normal.     Pupils: Pupils are equal, round, and reactive to light.  Neck:     Thyroid: No thyroid mass or thyromegaly.     Vascular: No carotid bruit.     Trachea: Trachea normal.  Cardiovascular:     Rate and Rhythm: Normal rate and regular rhythm.     Pulses: Normal pulses.     Heart sounds: Normal heart sounds, S1 normal and S2 normal. No murmur heard.    No friction rub. No gallop.  Pulmonary:     Effort: Pulmonary effort is normal. No tachypnea or respiratory distress.     Breath sounds: Normal breath sounds. No decreased breath sounds, wheezing, rhonchi or rales.  Abdominal:     General: Bowel sounds are normal.     Palpations: Abdomen is soft.     Tenderness: There is no abdominal tenderness.  Musculoskeletal:     Cervical back: Normal range of motion and neck supple.  Skin:    General: Skin is warm, dry and intact.     Findings: No rash.  Neurological:     Mental Status: She is alert.  Psychiatric:        Mood and Affect: Mood is not anxious or depressed.        Speech: Speech normal.        Behavior: Behavior normal. Behavior is cooperative.        Thought Content: Thought content normal.        Cognition and Memory: Cognition and memory normal.        Judgment: Judgment normal.       Diabetic foot exam: Normal inspection.[name] wearing compression hose  No skin breakdown No calluses  Normal DP pulses  No sensation to light touch and monofilament Nails normal  Results for orders placed or  performed in visit on 01/05/23  HM DIABETES FOOT EXAM  Result Value Ref Range   HM Diabetic Foot Exam done     Assessment and Plan  Type 2  diabetes mellitus with stage 3a chronic kidney disease, without long-term current use of insulin [facility]) Assessment & Plan: Chronic, new diagnosis March 16, 2022 with A1c at 6.7.  Referred to nutrition at that time.  Due for reevaluation  Orders: -     Hemoglobin A1c -     Microalbumin / creatinine urine ratio  Mixed hyperlipidemia Assessment & Plan: LDL previously at goal less than 100 on atorvastatin 10 mg daily.  Due for reevaluation  Orders: -     Atorvastatin Calcium; Take 1 tablet (10 mg total) by mouth daily.  Dispense: 90 tablet; Refill: 3 -     Comprehensive metabolic panel -     Lipid panel  Hyperlipidemia associated with type 2 diabetes mellitus (HCC) Assessment & Plan: LDL previously at goal less than 100 on atorvastatin 10 mg daily.  Due for reevaluation   MDD (major depressive disorder), recurrent episode, moderate (HCC) Assessment & Plan: Chronic, stable on zoloft 75 mg.   She is considering weaning off this medication as it is well controlled.   Osteopenia of multiple sites Assessment & Plan: Continue vitamin d and calcium.    Melanoma of skin (HCC) Assessment & Plan: Followed by dermatology q6 month   Venous (peripheral) insufficiency Assessment & Plan:  Wearing compression hose.   Chronic bilateral low back pain without sciatica  Other polyneuropathy Assessment & Plan:  Chronic... possible from back or other issues. ( 1994  surgery for scoliosis)  On gabapentin 400 mg at bedtime.  Followed by Dr Sherryll Burger and his PA.  Has appt upcoming in 02/2023   Varicose veins of leg with swelling, left Assessment & Plan:  Chronic swelling in left leg.   Obstructive sleep apnea Assessment & Plan: Uses CPAP   Secondary erythrocytosis Assessment & Plan:  Due to OSA   Essential tremor Assessment & Plan: Very  mile tremor, heredity.   PAC (premature atrial contraction) Assessment & Plan: Stable on metoprolol   Chronic kidney disease, stage 3a (HCC) Assessment & Plan:  GFR 57     Return in about 3 months (around 04/07/2023) for  CPE with me.   Kerby Nora, MD

## 2023-01-05 NOTE — Assessment & Plan Note (Signed)
LDL previously at goal less than 100 on atorvastatin 10 mg daily.  Due for reevaluation

## 2023-01-05 NOTE — Assessment & Plan Note (Signed)
Very mile tremor, heredity.

## 2023-01-05 NOTE — Assessment & Plan Note (Signed)
Wearing compression hose?

## 2023-01-05 NOTE — Assessment & Plan Note (Signed)
Stable on metoprolol. °

## 2023-01-05 NOTE — Patient Instructions (Signed)
Please stop at the lab to have labs drawn.  

## 2023-01-05 NOTE — Assessment & Plan Note (Signed)
Due to OSA

## 2023-01-05 NOTE — Assessment & Plan Note (Addendum)
Chronic... possible from back or other issues. ( 1994  surgery for scoliosis)  On gabapentin 400 mg at bedtime.  Followed by Dr Sherryll Burger and his PA.  Has appt upcoming in 02/2023

## 2023-01-05 NOTE — Assessment & Plan Note (Signed)
Chronic, new diagnosis March 16, 2022 with A1c at 6.7.  Referred to nutrition at that time.  Due for reevaluation

## 2023-01-05 NOTE — Assessment & Plan Note (Addendum)
Followed by dermatology q6 month

## 2023-01-05 NOTE — Assessment & Plan Note (Signed)
Uses CPAP 

## 2023-01-06 LAB — COMPREHENSIVE METABOLIC PANEL
ALT: 16 U/L (ref 0–35)
AST: 24 U/L (ref 0–37)
Albumin: 4.4 g/dL (ref 3.5–5.2)
Alkaline Phosphatase: 65 U/L (ref 39–117)
BUN: 21 mg/dL (ref 6–23)
CO2: 32 mEq/L (ref 19–32)
Calcium: 10 mg/dL (ref 8.4–10.5)
Chloride: 97 mEq/L (ref 96–112)
Creatinine, Ser: 0.88 mg/dL (ref 0.40–1.20)
GFR: 63.88 mL/min (ref >60.00)
Glucose, Bld: 97 mg/dL (ref 70–99)
Potassium: 4.8 mEq/L (ref 3.5–5.1)
Sodium: 136 mEq/L (ref 135–145)
Total Bilirubin: 0.4 mg/dL (ref 0.2–1.2)
Total Protein: 7.4 g/dL (ref 6.0–8.3)

## 2023-01-06 LAB — MICROALBUMIN / CREATININE URINE RATIO
Creatinine,U: 25.7 mg/dL
Microalb Creat Ratio: 2.7 mg/g (ref 0.0–30.0)
Microalb, Ur: <0.7 mg/dL (ref 0.0–1.9)

## 2023-01-06 LAB — LIPID PANEL
Cholesterol: 155 mg/dL (ref 0–200)
HDL: 52.7 mg/dL (ref >39.00)
LDL Cholesterol: 83 mg/dL (ref 0–99)
NonHDL: 102.28
Total CHOL/HDL Ratio: 3
Triglycerides: 98 mg/dL (ref 0.0–149.0)
VLDL: 19.6 mg/dL (ref 0.0–40.0)

## 2023-01-06 LAB — HEMOGLOBIN A1C: Hgb A1c MFr Bld: 6.6 % — ABNORMAL HIGH (ref 4.6–6.5)

## 2023-01-12 ENCOUNTER — Ambulatory Visit (INDEPENDENT_AMBULATORY_CARE_PROVIDER_SITE_OTHER): Payer: Medicare HMO

## 2023-01-12 VITALS — Ht 64.0 in | Wt 140.0 lb

## 2023-01-12 DIAGNOSIS — Z0000 Encounter for general adult medical examination without abnormal findings: Secondary | ICD-10-CM | POA: Diagnosis not present

## 2023-01-12 NOTE — Progress Notes (Signed)
Subjective:   Dana Massey is a 77 y.o. female who presents for Medicare Annual (Subsequent) preventive examination.  Visit Complete: Virtual  I connected with  Dana Massey on 01/12/23 by a audio enabled telemedicine application and verified that I am speaking with the correct person using two identifiers.  Patient Location: Home  Provider Location: Home Office  I discussed the limitations of evaluation and management by telemedicine. The patient expressed understanding and agreed to proceed.  Patient Medicare AWV questionnaire was completed by the patient on 01/06/23; I have confirmed that all information answered by patient is correct and no changes since this date.  Vital Signs: Unable to obtain new vitals due to this being a telehealth visit.  Review of Systems      Cardiac Risk Factors include: advanced age (>23men, >79 women);hypertension;diabetes mellitus;dyslipidemia;obesity (BMI >30kg/m2)     Objective:    Today's Vitals   01/12/23 1011  Weight: 140 lb (63.5 kg)  Height: 5\' 4"  (1.626 m)   Body mass index is 24.03 kg/m.     01/12/2023   10:19 AM 04/27/2022    2:54 PM 03/16/2022    2:15 PM 05/10/2021    3:18 PM 03/12/2021   10:17 AM 04/03/2020    2:47 PM 03/05/2020    9:11 AM  Advanced Directives  Does Patient Have a Medical Advance Directive? Yes Yes Yes Yes Yes No Yes  Type of Estate agent of [place];Living will Healthcare Power of [place];Living will Living will Living will Healthcare Power of Attorney    Does patient want to make changes to medical advance directive? No - Patient declined No - Patient declined No - Patient declined  No - Patient declined    Copy of Healthcare Power of Attorney in Chart? Yes - validated most recent copy scanned in chart (See row information)    Yes - validated most recent copy scanned in chart (See row information)    Would patient like information on creating a medical advance directive?    No  - Patient declined  No - Patient declined     Current Medications (verified) Outpatient Encounter Medications as of 01/12/2023  Medication Sig   Acetaminophen 500 MG coapsule Take 50 mg by mouth every 6 (six) hours as needed.   aspirin EC 81 MG tablet Take 81 mg by mouth daily.   atorvastatin (LIPITOR) 10 MG tablet Take 1 tablet (10 mg total) by mouth daily.   Calcium-Vitamin D-Vitamin K 500-100-40 MG-UNT-MCG CHEW Chew 1 capsule by mouth daily.     dorzolamide-timolol (COSOPT) 22.3-6.8 MG/ML ophthalmic solution Place 1 drop into both eyes in the morning and at bedtime.   fish oil-omega-3 fatty acids 1000 MG capsule Take 2 g by mouth daily.     gabapentin (NEURONTIN) 400 MG capsule Take 400 mg by mouth at bedtime.   latanoprost (XALATAN) 0.005 % ophthalmic solution Place 1 drop into both eyes at bedtime.   MELATONIN PO Take 6 mg by mouth at bedtime as needed.    metoprolol succinate (TOPROL-XL) 25 MG 24 hr tablet TAKE 1 TABLET DAILY WITH OR IMMEDIATELY FOLLOWING A MEAL.   Multiple Vitamins-Minerals (MULTIVITAMIN WITH MINERALS) tablet Take 1 tablet by mouth daily.     polyethylene glycol powder (GLYCOLAX/MIRALAX) 17 GM/SCOOP powder Take 17 g by mouth daily as needed.    sertraline (ZOLOFT) 50 MG tablet TAKE 1 AND 1/2 TABLETS EVERY DAY   No facility-administered encounter medications on file as of 01/12/2023.  Allergies (verified) Wasp venom   History: Past Medical History:  Diagnosis Date   Cataract    left eye surgery to remove, right eye just watching currently   Constipation    Depression    Diabetes mellitus without complication (HCC)    Diverticulosis of colon    Glaucoma    ocular htn bilateral eyes - uses drops    History of blood transfusion 1994   with spinal fusion surgery   IBS (irritable bowel syndrome)    Migraine headache    resolved, no current problem   Mitral valve prolapse    patient denies this dx   Neuromuscular disorder (HCC)    essential tremor hands    Osteopenia    PAC (premature atrial contraction)    on metoprolol   Palpitations    Scoliosis    Shoulder pain    resolved, no loonger a problem   Toxic effect of venom(989.5)    Venous insufficiency    Past Surgical History:  Procedure Laterality Date   BACK SURGERY  1994   scoliosis at [facility]   CATARACT EXTRACTION Left 04/2010   COLONOSCOPY  2009   [facility] Goodell- Normal   ROTATOR CUFF REPAIR  2006   WISDOM TOOTH EXTRACTION     Family History  Problem Relation Age of Onset   Diabetes Mother    Cancer Father        melanoma   Stroke Paternal Grandmother 8   Colon cancer Neg Hx    Rectal cancer Neg Hx    Stomach cancer Neg Hx    Esophageal cancer Neg Hx    Social History   Socioeconomic History   Marital status: Married    Spouse name: Dana Massey   Number of children: 2   Years of education: associate degree   Highest education level: Not on file  Occupational History   Not on file  Tobacco Use   Smoking status: Never   Smokeless tobacco: Never  Vaping Use   Vaping status: Never Used  Substance and Sexual Activity   Alcohol use: Never    Comment: rarely - holiday   Drug use: No   Sexual activity: Yes    Partners: Male    Birth control/protection: Post-menopausal    Comment: husband vasectomy  Other Topics Concern   Not on file  Social History Narrative   Does not have a living will.   Desires CPR but would not want prolonged life support.   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       Family: 2 children - Dana Massey and Dana Massey - both [place], 3 grandchildren (age 73 to 51)      Enjoys: reading, knitting, exercise, watch TV,       Exercise: 4 times a week - 45 minute senior fitness class   Diet: tries to eat right - fish a few times a week, no beef, not as many veggies as she should      Safety   Seat belts:  Yes    Guns: Yes  and secure   Safe in relationships: Yes          Social Determinants of Health   Financial Resource Strain: Low Risk  (01/12/2023)   Overall Financial Resource Strain (CARDIA)    Difficulty of Paying Living Expenses: Not hard at all  Food Insecurity: No Food Insecurity (01/12/2023)   Hunger Vital Sign    Worried About Running Out of Food in the Last Year: Never true    Ran Out of Food in the Last Year: Never true  Transportation Needs: No Transportation Needs (01/12/2023)   PRAPARE - Administrator, Civil Service (Medical): No    Lack of Transportation (Non-Medical): No  Physical Activity: Insufficiently Active (01/12/2023)   Exercise Vital Sign    Days of Exercise per Week: 4 days    Minutes of Exercise per Session: 30 min  Stress: No Stress Concern Present (01/12/2023)   [employer] of Occupational Health - Occupational Stress Questionnaire    Feeling of Stress : Not at all  Social Connections: Socially Isolated (01/12/2023)   Social Connection and Isolation Panel [NHANES]    Frequency of Communication with Friends and Family: Once a week    Frequency of Social Gatherings with Friends and Family: Once a week    Attends Religious Services: Never    Database administrator or Organizations: No    Attends Engineer, structural: Never    Marital Status: Married    Tobacco Counseling Counseling given: Not Answered   Clinical Intake:  Pre-visit preparation completed: Yes  Pain : No/denies pain     BMI - recorded: 24.03 Nutritional Status: BMI of 19-24  Normal Nutritional Risks: None Diabetes: Yes CBG done?: No Did pt. bring in CBG monitor from home?: No  How often do you need to have someone help you when you read instructions, pamphlets, or other written materials from your doctor or pharmacy?: 1 - Never  Interpreter Needed?: No  Information entered by :: C.Jordane Hisle LPN   Activities of Daily Living    01/06/2023   11:19  AM 03/16/2022    2:16 PM  In your present state of health, do you have any difficulty performing the following activities:  Hearing? 0 0  Vision? 0 0  Difficulty concentrating or making decisions? 0 0  Walking or climbing stairs? 1 1  Comment neuropathy in right foot peripheral neuropathy impacts this  Dressing or bathing? 0 0  Doing errands, shopping? 1 0  Comment Husband assists   Preparing Food and eating ? N N  Using the Toilet? N N  In the past six months, have you accidently leaked urine? N N  Do you have problems with loss of bowel control? N N  Managing your Medications? N N  Managing your Finances? N N  Housekeeping or managing your Housekeeping? N N    Patient Care Team: Excell Seltzer, MD as PCP - General (Family Medicine) Marinus Maw, MD as Consulting Physician (Cardiology) Fredrich Birks, OD as Referring Physician (Optometry) Tora Duck, PA-C as Physician Assistant (Dermatology) Lonell Face, MD as Consulting Physician (Neurology) Wyn Quaker Marlow Baars, MD as Referring Physician (Vascular Surgery)  Indicate any recent Medical Services you may have received from other than [facility] providers in the past year (date may be approximate).     Assessment:   This is a routine wellness examination for [place].  Hearing/Vision screen Hearing Screening - Comments:: Denies hearing difficulties   Vision Screening - Comments:: Glasses - [facility] - UTD on eye exams, goes twice a year.  Dietary issues and exercise activities discussed:     Goals Addressed             This Visit's Progress    Patient Stated  Lose 5 pounds and exercise more.       Depression Screen    01/12/2023   10:19 AM 01/05/2023    4:19 PM 04/27/2022    2:54 PM 03/16/2022    2:56 PM 03/12/2021   10:08 AM 03/05/2020    9:45 AM 03/05/2020    9:12 AM  PHQ 2/9 Scores  PHQ - 2 Score 0 0 0 0 0 0 0  PHQ- 9 Score 0 1  2 0      Fall Risk    01/06/2023   11:19 AM 01/05/2023    3:03 PM  04/27/2022    2:54 PM 03/16/2022    2:06 PM 03/12/2021    9:57 AM  Fall Risk   Falls in the past year? 1 1 1 1 1   Number falls in past yr: 0 0 0 0 0  Comment lost balance exercising      Injury with Fall? 0 0 1 1 1   Comment   concussion concussion Broke bone in foot  Risk for fall due to : No Fall Risks History of fall(s) History of fall(s) Impaired balance/gait   Follow up Falls evaluation completed;Education provided;Falls prevention discussed Falls evaluation completed       MEDICARE RISK AT HOME:   TIMED UP AND GO:  Was the test performed?  No    Cognitive Function:    01/05/2017   11:24 AM 12/25/2015   11:20 AM  MMSE - Mini Mental State Exam  Orientation to time 5 5  Orientation to Place 5 5  Registration 3 3  Attention/ Calculation 0 0  Recall 3 3  Language- name 2 objects 0 0  Language- repeat 1 1  Language- follow 3 step command 3 3  Language- read & follow direction 0 0  Write a sentence 0 0  Copy design 0 0  Total score 20 20        01/12/2023   10:23 AM 03/05/2020    9:14 AM 02/08/2019   10:15 AM  6CIT Screen  What Year? 0 points 0 points 0 points  What month? 0 points 0 points 0 points  What time? 0 points 0 points 0 points  Count back from 20 0 points 0 points 0 points  Months in reverse 0 points 0 points 0 points  Repeat phrase 0 points 2 points 0 points  Total Score 0 points 2 points 0 points    Immunizations Immunization History  Administered Date(s) Administered   COVID-19, mRNA, vaccine(Comirnaty)12 years and older 03/06/2022   Fluad Quad(high Dose 65+) 02/08/2019, 03/05/2020, 03/12/2021, 03/06/2022   Influenza Split 05/26/2011, 03/22/2012   Influenza Whole 03/07/2008, 03/29/2009, 03/14/2010   Influenza,inj,Quad PF,6+ Mos 03/21/2013, 02/21/2014, 03/28/2015, 04/15/2016, 03/19/2017   Influenza,trivalent, recombinat, inj, PF 03/11/2018   PFIZER Comirnaty(Gray Top)Covid-19 Tri-Sucrose Vaccine 10/29/2020   PFIZER(Purple Top)SARS-COV-2  Vaccination 07/28/2019, 08/23/2019, 04/19/2020   Pfizer Covid-19 Vaccine Bivalent Booster 27yrs & up 03/25/2021   Pneumococcal Conjugate-13 12/18/2013   Pneumococcal Polysaccharide-23 11/20/2011   Td 12/24/2014   Zoster Recombinant(Shingrix) 06/23/2021, 09/05/2021   Zoster, Live 12/18/2013    TDAP status: Up to date  Flu Vaccine status: Up to date  Pneumococcal vaccine status: Up to date  Covid-19 vaccine status: Information provided on how to obtain vaccines.   Qualifies for Shingles Vaccine? Yes   Zostavax completed Yes   Shingrix Completed?: Yes  Screening Tests Health Maintenance  Topic Date Due   OPHTHALMOLOGY EXAM  Never done   COVID-19 Vaccine (7 -  2023-24 season) 05/01/2022   Colonoscopy  01/05/2023   INFLUENZA VACCINE  01/14/2023   Medicare Annual Wellness (AWV)  03/17/2023   HEMOGLOBIN A1C  07/08/2023   MAMMOGRAM  12/22/2023   Diabetic kidney evaluation - eGFR measurement  01/05/2024   Diabetic kidney evaluation - Urine ACR  01/05/2024   FOOT EXAM  01/05/2024   DEXA SCAN  02/18/2024   DTaP/Tdap/Td (2 - Tdap) 12/23/2024   Pneumonia Vaccine 53+ Years old  Completed   Hepatitis C Screening  Completed   Zoster Vaccines- Shingrix  Completed   HPV VACCINES  Aged Out    Health Maintenance  Health Maintenance Due  Topic Date Due   OPHTHALMOLOGY EXAM  Never done   COVID-19 Vaccine (7 - 2023-24 season) 05/01/2022   Colonoscopy  01/05/2023    Colorectal cancer screening: Type of screening: Colonoscopy. Completed 01/04/18. Repeat every 10 years Colonoscopy scheduled for October 2024.  Mammogram status: Completed 12/22/22. Repeat every year  Bone Density status: Completed 02/17/22. Results reflect: Bone density results: OSTEOPENIA. Repeat every 2 years.  Lung Cancer Screening: (Low Dose CT Chest recommended if Age 24-80 years, 20 pack-year currently smoking OR have quit w/in 15years.) does not qualify.   Lung Cancer Screening Referral: n/a  Additional  Screening:  Hepatitis C Screening: does qualify; Completed 12/25/15  Vision Screening: Recommended annual ophthalmology exams for early detection of glaucoma and other disorders of the eye. Is the patient up to date with their annual eye exam?  Yes  Who is the provider or what is the name of the office in which the patient attends annual eye exams? [facility]. If pt is not established with a provider, would they like to be referred to a provider to establish care? Yes .   Dental Screening: Recommended annual dental exams for proper oral hygiene  Diabetic Foot Exam: Diabetic Foot Exam: Completed 01/05/23  Community Resource Referral / Chronic Care Management: CRR required this visit?  No   CCM required this visit?  No     Plan:     I have personally reviewed and noted the following in the patient's chart:   Medical and social history Use of alcohol, tobacco or illicit drugs  Current medications and supplements including opioid prescriptions. Patient is not currently taking opioid prescriptions. Functional ability and status Nutritional status Physical activity Advanced directives List of other physicians Hospitalizations, surgeries, and ER visits in previous 12 months Vitals Screenings to include cognitive, depression, and falls Referrals and appointments  In addition, I have reviewed and discussed with patient certain preventive protocols, quality metrics, and best practice recommendations. A written personalized care plan for preventive services as well as general preventive health recommendations were provided to patient.     Maryan Puls, LPN   [id number]   After Visit Summary: (MyChart) Due to this being a telephonic visit, the after visit summary with patients personalized plan was offered to patient via MyChart   Nurse Notes: Diabetic eye exam reported as done by pt. Results have been requested.

## 2023-01-12 NOTE — Patient Instructions (Signed)
Dana Massey , Thank you for taking time to come for your Medicare Wellness Visit. I appreciate your ongoing commitment to your health goals. Please review the following plan we discussed and let me know if I can assist you in the future.   Referrals/Orders/Follow-Ups/Clinician Recommendations: Aim for 30 minutes of exercise or brisk walking, 6-8 glasses of water, and 5 servings of fruits and vegetables each day.   This is a list of the screening recommended for you and due dates:  Health Maintenance  Topic Date Due   Eye exam for diabetics  Never done   COVID-19 Vaccine (7 - 2023-24 season) 05/01/2022   Colon Cancer Screening  01/05/2023   Flu Shot  01/14/2023   Medicare Annual Wellness Visit  03/17/2023   Hemoglobin A1C  07/08/2023   Mammogram  12/22/2023   Yearly kidney function blood test for diabetes  01/05/2024   Yearly kidney health urinalysis for diabetes  01/05/2024   Complete foot exam   01/05/2024   DEXA scan (bone density measurement)  02/18/2024   DTaP/Tdap/Td vaccine (2 - Tdap) 12/23/2024   Pneumonia Vaccine  Completed   Hepatitis C Screening  Completed   Zoster (Shingles) Vaccine  Completed   HPV Vaccine  Aged Out    Advanced directives: (In Chart) A copy of your advanced directives are scanned into your chart should your provider ever need it.  Next Medicare Annual Wellness Visit scheduled for next year: Yes  Preventive Care 76 Years and Older, Female Preventive care refers to lifestyle choices and visits with your health care provider that can promote health and wellness. What does preventive care include? A yearly physical exam. This is also called an annual well check. Dental exams once or twice a year. Routine eye exams. Ask your health care provider how often you should have your eyes checked. Personal lifestyle choices, including: Daily care of your teeth and gums. Regular physical activity. Eating a healthy diet. Avoiding tobacco and drug use. Limiting  alcohol use. Practicing safe sex. Taking low-dose aspirin every day. Taking vitamin and mineral supplements as recommended by your health care provider. What happens during an annual well check? The services and screenings done by your health care provider during your annual well check will depend on your age, overall health, lifestyle risk factors, and family history of disease. Counseling  Your health care provider may ask you questions about your: Alcohol use. Tobacco use. Drug use. Emotional well-being. Home and relationship well-being. Sexual activity. Eating habits. History of falls. Memory and ability to understand (cognition). Work and work Astronomer. Reproductive health. Screening  You may have the following tests or measurements: Height, weight, and BMI. Blood pressure. Lipid and cholesterol levels. These may be checked every 5 years, or more frequently if you are over 51 years old. Skin check. Lung cancer screening. You may have this screening every year starting at age 62 if you have a 30-pack-year history of smoking and currently smoke or have quit within the past 15 years. Fecal occult blood test (FOBT) of the stool. You may have this test every year starting at age 30. Flexible sigmoidoscopy or colonoscopy. You may have a sigmoidoscopy every 5 years or a colonoscopy every 10 years starting at age 44. Hepatitis C blood test. Hepatitis B blood test. Sexually transmitted disease (STD) testing. Diabetes screening. This is done by checking your blood sugar (glucose) after you have not eaten for a while (fasting). You may have this done every 1-3 years. Bone density scan.  This is done to screen for osteoporosis. You may have this done starting at age 67. Mammogram. This may be done every 1-2 years. Talk to your health care provider about how often you should have regular mammograms. Talk with your health care provider about your test results, treatment options, and if  necessary, the need for more tests. Vaccines  Your health care provider may recommend certain vaccines, such as: Influenza vaccine. This is recommended every year. Tetanus, diphtheria, and acellular pertussis (Tdap, Td) vaccine. You may need a Td booster every 10 years. Zoster vaccine. You may need this after age 30. Pneumococcal 13-valent conjugate (PCV13) vaccine. One dose is recommended after age 1. Pneumococcal polysaccharide (PPSV23) vaccine. One dose is recommended after age 46. Talk to your health care provider about which screenings and vaccines you need and how often you need them. This information is not intended to replace advice given to you by your health care provider. Make sure you discuss any questions you have with your health care provider. Document Released: 06/28/2015 Document Revised: 02/19/2016 Document Reviewed: 04/02/2015 Elsevier Interactive Patient Education  2017 [employer].  Fall Prevention in the Home Falls can cause injuries. They can happen to people of all ages. There are many things you can do to make your home safe and to help prevent falls. What can I do on the outside of my home? Regularly fix the edges of walkways and driveways and fix any cracks. Remove anything that might make you trip as you walk through a door, such as a raised step or threshold. Trim any bushes or trees on the path to your home. Use bright outdoor lighting. Clear any walking paths of anything that might make someone trip, such as rocks or tools. Regularly check to see if handrails are loose or broken. Make sure that both sides of any steps have handrails. Any raised decks and porches should have guardrails on the edges. Have any leaves, snow, or ice cleared regularly. Use sand or salt on walking paths during winter. Clean up any spills in your garage right away. This includes oil or grease spills. What can I do in the bathroom? Use night lights. Install grab bars by the toilet  and in the tub and shower. Do not use towel bars as grab bars. Use non-skid mats or decals in the tub or shower. If you need to sit down in the shower, use a plastic, non-slip stool. Keep the floor dry. Clean up any water that spills on the floor as soon as it happens. Remove soap buildup in the tub or shower regularly. Attach bath mats securely with double-sided non-slip rug tape. Do not have throw rugs and other things on the floor that can make you trip. What can I do in the bedroom? Use night lights. Make sure that you have a light by your bed that is easy to reach. Do not use any sheets or blankets that are too big for your bed. They should not hang down onto the floor. Have a firm chair that has side arms. You can use this for support while you get dressed. Do not have throw rugs and other things on the floor that can make you trip. What can I do in the kitchen? Clean up any spills right away. Avoid walking on wet floors. Keep items that you use a lot in easy-to-reach places. If you need to reach something above you, use a strong step stool that has a grab bar. Keep electrical cords  out of the way. Do not use floor polish or wax that makes floors slippery. If you must use wax, use non-skid floor wax. Do not have throw rugs and other things on the floor that can make you trip. What can I do with my stairs? Do not leave any items on the stairs. Make sure that there are handrails on both sides of the stairs and use them. Fix handrails that are broken or loose. Make sure that handrails are as long as the stairways. Check any carpeting to make sure that it is firmly attached to the stairs. Fix any carpet that is loose or worn. Avoid having throw rugs at the top or bottom of the stairs. If you do have throw rugs, attach them to the floor with carpet tape. Make sure that you have a light switch at the top of the stairs and the bottom of the stairs. If you do not have them, ask someone to add  them for you. What else can I do to help prevent falls? Wear shoes that: Do not have high heels. Have rubber bottoms. Are comfortable and fit you well. Are closed at the toe. Do not wear sandals. If you use a stepladder: Make sure that it is fully opened. Do not climb a closed stepladder. Make sure that both sides of the stepladder are locked into place. Ask someone to hold it for you, if possible. Clearly mark and make sure that you can see: Any grab bars or handrails. First and last steps. Where the edge of each step is. Use tools that help you move around (mobility aids) if they are needed. These include: Canes. Walkers. Scooters. Crutches. Turn on the lights when you go into a dark area. Replace any light bulbs as soon as they burn out. Set up your furniture so you have a clear path. Avoid moving your furniture around. If any of your floors are uneven, fix them. If there are any pets around you, be aware of where they are. Review your medicines with your doctor. Some medicines can make you feel dizzy. This can increase your chance of falling. Ask your doctor what other things that you can do to help prevent falls. This information is not intended to replace advice given to you by your health care provider. Make sure you discuss any questions you have with your health care provider. Document Released: 03/28/2009 Document Revised: 11/07/2015 Document Reviewed: 07/06/2014 Elsevier Interactive Patient Education  2017 [employer].

## 2023-01-15 DIAGNOSIS — L814 Other melanin hyperpigmentation: Secondary | ICD-10-CM | POA: Diagnosis not present

## 2023-01-15 DIAGNOSIS — L538 Other specified erythematous conditions: Secondary | ICD-10-CM | POA: Diagnosis not present

## 2023-01-15 DIAGNOSIS — C4339 Malignant melanoma of other parts of face: Secondary | ICD-10-CM | POA: Diagnosis not present

## 2023-01-15 DIAGNOSIS — D2371 Other benign neoplasm of skin of right lower limb, including hip: Secondary | ICD-10-CM | POA: Diagnosis not present

## 2023-01-15 DIAGNOSIS — Z08 Encounter for follow-up examination after completed treatment for malignant neoplasm: Secondary | ICD-10-CM | POA: Diagnosis not present

## 2023-01-15 DIAGNOSIS — L820 Inflamed seborrheic keratosis: Secondary | ICD-10-CM | POA: Diagnosis not present

## 2023-01-15 DIAGNOSIS — L821 Other seborrheic keratosis: Secondary | ICD-10-CM | POA: Diagnosis not present

## 2023-01-15 DIAGNOSIS — L298 Other pruritus: Secondary | ICD-10-CM | POA: Diagnosis not present

## 2023-01-15 DIAGNOSIS — Z85820 Personal history of malignant melanoma of skin: Secondary | ICD-10-CM | POA: Diagnosis not present

## 2023-01-26 DIAGNOSIS — N1831 Chronic kidney disease, stage 3a: Secondary | ICD-10-CM | POA: Diagnosis not present

## 2023-01-26 DIAGNOSIS — E1122 Type 2 diabetes mellitus with diabetic chronic kidney disease: Secondary | ICD-10-CM | POA: Diagnosis not present

## 2023-01-26 DIAGNOSIS — G629 Polyneuropathy, unspecified: Secondary | ICD-10-CM | POA: Diagnosis not present

## 2023-01-26 DIAGNOSIS — R2689 Other abnormalities of gait and mobility: Secondary | ICD-10-CM | POA: Diagnosis not present

## 2023-02-16 ENCOUNTER — Ambulatory Visit (AMBULATORY_SURGERY_CENTER): Payer: Medicare HMO

## 2023-02-16 VITALS — Ht 64.0 in | Wt 140.0 lb

## 2023-02-16 DIAGNOSIS — Z86010 Personal history of colonic polyps: Secondary | ICD-10-CM

## 2023-02-16 MED ORDER — NA SULFATE-K SULFATE-MG SULF 17.5-3.13-1.6 GM/177ML PO SOLN
1.0000 | Freq: Once | ORAL | 0 refills | Status: AC
Start: 2023-02-16 — End: 2023-02-16

## 2023-02-16 NOTE — Progress Notes (Signed)
No egg or soy allergy known to patient  No issues known to pt with past sedation with any surgeries or procedures Patient denies ever being told they had issues or difficulty with intubation  No FH of Malignant Hyperthermia Pt is not on diet pills Pt is not on  home 02  Pt is not on blood thinners  Pt denies issues with constipation takes miralax No A fib or A flutter Have any cardiac testing pending--no Pt can ambulate independently occasionally used a cane Pt denies use of chewing tobacco Discussed diabetic I weight loss medication holds Discussed NSAID holds Checked BMI Pt instructed to use Singlecare.com or GoodRx for a price reduction on prep  Patient's chart reviewed by Cathlyn Parsons CNRA prior to previsit and patient appropriate for the [facility].  Pre visit completed and red dot placed by patient's name on their procedure day (on provider's schedule).

## 2023-02-17 DIAGNOSIS — R2689 Other abnormalities of gait and mobility: Secondary | ICD-10-CM | POA: Diagnosis not present

## 2023-02-17 DIAGNOSIS — M545 Low back pain, unspecified: Secondary | ICD-10-CM | POA: Diagnosis not present

## 2023-02-23 DIAGNOSIS — R2689 Other abnormalities of gait and mobility: Secondary | ICD-10-CM | POA: Diagnosis not present

## 2023-02-25 DIAGNOSIS — R2689 Other abnormalities of gait and mobility: Secondary | ICD-10-CM | POA: Diagnosis not present

## 2023-02-25 DIAGNOSIS — H40053 Ocular hypertension, bilateral: Secondary | ICD-10-CM | POA: Diagnosis not present

## 2023-02-25 LAB — HM DIABETES EYE EXAM

## 2023-03-02 DIAGNOSIS — R2689 Other abnormalities of gait and mobility: Secondary | ICD-10-CM | POA: Diagnosis not present

## 2023-03-04 DIAGNOSIS — R2689 Other abnormalities of gait and mobility: Secondary | ICD-10-CM | POA: Diagnosis not present

## 2023-03-05 ENCOUNTER — Encounter: Payer: Self-pay | Admitting: Internal Medicine

## 2023-03-09 DIAGNOSIS — R2689 Other abnormalities of gait and mobility: Secondary | ICD-10-CM | POA: Diagnosis not present

## 2023-03-16 ENCOUNTER — Encounter: Payer: Self-pay | Admitting: Internal Medicine

## 2023-03-16 ENCOUNTER — Ambulatory Visit (AMBULATORY_SURGERY_CENTER): Payer: Medicare HMO | Admitting: Internal Medicine

## 2023-03-16 VITALS — BP 103/58 | HR 72 | Temp 97.3°F | Resp 10 | Ht 64.0 in | Wt 140.0 lb

## 2023-03-16 DIAGNOSIS — G4733 Obstructive sleep apnea (adult) (pediatric): Secondary | ICD-10-CM | POA: Diagnosis not present

## 2023-03-16 DIAGNOSIS — E119 Type 2 diabetes mellitus without complications: Secondary | ICD-10-CM | POA: Diagnosis not present

## 2023-03-16 DIAGNOSIS — Z09 Encounter for follow-up examination after completed treatment for conditions other than malignant neoplasm: Secondary | ICD-10-CM | POA: Diagnosis not present

## 2023-03-16 DIAGNOSIS — Z860100 Personal history of colon polyps, unspecified: Secondary | ICD-10-CM

## 2023-03-16 DIAGNOSIS — Z860101 Personal history of adenomatous and serrated colon polyps: Secondary | ICD-10-CM | POA: Diagnosis not present

## 2023-03-16 DIAGNOSIS — F32A Depression, unspecified: Secondary | ICD-10-CM | POA: Diagnosis not present

## 2023-03-16 MED ORDER — SODIUM CHLORIDE 0.9 % IV SOLN: 500.0000 mL | Freq: Once | INTRAVENOUS | Status: DC

## 2023-03-16 NOTE — Progress Notes (Signed)
HISTORY OF PRESENT ILLNESS:  Dana Massey is a 77 y.o. female with a history of adenomatous colon polyps.  Presents today for surveillance colonoscopy.  REVIEW OF SYSTEMS:  All non-GI ROS negative except for  Past Medical History:  Diagnosis Date   Cataract    left eye surgery to remove, right eye just watching currently   Constipation    Depression    Diabetes mellitus without complication (HCC)    Diverticulosis of colon    Glaucoma    ocular htn bilateral eyes - uses drops    History of blood transfusion 1994   with spinal fusion surgery   IBS (irritable bowel syndrome)    Migraine headache    resolved, no current problem   Mitral valve prolapse    patient denies this dx   Neuromuscular disorder (HCC)    essential tremor hands   Osteopenia    Osteoporosis    PAC (premature atrial contraction)    on metoprolol   Palpitations    Scoliosis    Shoulder pain    resolved, no loonger a problem   Sleep apnea    Toxic effect of venom(989.5)    Venous insufficiency     Past Surgical History:  Procedure Laterality Date   BACK SURGERY  1994   scoliosis at [facility]   CATARACT EXTRACTION Left 04/2010   COLONOSCOPY  2009   Marina Goodell- Normal   ROTATOR CUFF REPAIR  2006   WISDOM TOOTH EXTRACTION      Social History Dana Massey  reports that she has never smoked. She has never used smokeless tobacco. She reports that she does not drink alcohol and does not use drugs.  family history includes Cancer in her father; Diabetes in her mother; Stroke (age of onset: 73) in her paternal grandmother.  Allergies  Allergen Reactions   Wasp Venom Anaphylaxis       PHYSICAL EXAMINATION: Vital signs: BP (!) 142/71 (BP Location: Right Arm, Patient Position: Sitting, Cuff Size: Normal)   Pulse 75   Temp (!) 97.3 F (36.3 C) (Temporal)   Ht 5\' 4"  (1.626 m)   Wt 140 lb (63.5 kg)   LMP 09/13/2000   SpO2 92%   BMI 24.03 kg/m  General: Well-developed,  well-nourished, no acute distress HEENT: Sclerae are anicteric, conjunctiva pink. Oral mucosa intact Lungs: Clear Heart: Regular Abdomen: soft, nontender, nondistended, no obvious ascites, no peritoneal signs, normal bowel sounds. No organomegaly. Extremities: No edema Psychiatric: alert and oriented x3. Cooperative     ASSESSMENT:   History of adenomatous polyps  PLAN:   Surveillance colonoscopy

## 2023-03-16 NOTE — Progress Notes (Signed)
[facility]  Called to room to assist during endoscopic procedure.  Patient ID and intended procedure confirmed with present staff. Received instructions for my participation in the procedure from the performing physician.

## 2023-03-16 NOTE — Patient Instructions (Signed)
Resume previous diet Continue present medications   Handouts/information given for diverticulosis   YOU HAD AN ENDOSCOPIC PROCEDURE TODAY AT THE [facility]:   Refer to the procedure report that was given to you for any specific questions about what was found during the examination.  If the procedure report does not answer your questions, please call your gastroenterologist to clarify.  If you requested that your care partner not be given the details of your procedure findings, then the procedure report has been included in a sealed envelope for you to review at your convenience later.  YOU SHOULD EXPECT: Some feelings of bloating in the abdomen. Passage of more gas than usual.  Walking can help get rid of the air that was put into your GI tract during the procedure and reduce the bloating. If you had a lower endoscopy (such as a colonoscopy or flexible sigmoidoscopy) you may notice spotting of blood in your stool or on the toilet paper. If you underwent a bowel prep for your procedure, you may not have a normal bowel movement for a few days.  Please Note:  You might notice some irritation and congestion in your nose or some drainage.  This is from the oxygen used during your procedure.  There is no need for concern and it should clear up in a day or so.  SYMPTOMS TO REPORT IMMEDIATELY:  Following lower endoscopy (colonoscopy):  Excessive amounts of blood in the stool  Significant tenderness or worsening of abdominal pains  Swelling of the abdomen that is new, acute  Fever of 100F or higher  For urgent or emergent issues, a gastroenterologist can be reached at any hour by calling [phone]. Do not use MyChart messaging for urgent concerns.    DIET:  We do recommend a small meal at first, but then you may proceed to your regular diet.  Drink plenty of fluids but you should avoid alcoholic beverages for 24 hours.  ACTIVITY:  You should plan to take it easy for the rest of  today and you should NOT DRIVE or use heavy machinery until tomorrow (because of the sedation medicines used during the test).    FOLLOW UP: Our staff will call the number listed on your records the next business day following your procedure.  We will call around 7:15- 8:00 am to check on you and address any questions or concerns that you may have regarding the information given to you following your procedure. If we do not reach you, we will leave a message.     SIGNATURES/CONFIDENTIALITY: You and/or your care partner have signed paperwork which will be entered into your electronic medical record.  These signatures attest to the fact that that the information above on your After Visit Summary has been reviewed and is understood.  Full responsibility of the confidentiality of this discharge information lies with you and/or your care-partner.

## 2023-03-16 NOTE — Op Note (Signed)
[facility] Patient Name: Dana Massey Procedure Date: 03/16/2023 11:20 AM MRN: [id number] Endoscopist: Wilhemina Bonito. Marina Goodell , MD, [id number] Age: 77 Referring MD:  Date of Birth: September 11, 1945 Gender: Female Account #: [id number] Procedure:                Colonoscopy Indications:              High risk colon cancer surveillance: Personal                            history of non-advanced adenomas. Prior 2001, 09, 19 Medicines:                Monitored Anesthesia Care Procedure:                Pre-Anesthesia Assessment:                           - Prior to the procedure, a History and Physical                            was performed, and patient medications and                            allergies were reviewed. The patient's tolerance of                            previous anesthesia was also reviewed. The risks                            and benefits of the procedure and the sedation                            options and risks were discussed with the patient.                            All questions were answered, and informed consent                            was obtained. Prior Anticoagulants: The patient has                            taken no anticoagulant or antiplatelet agents. ASA                            Grade Assessment: II - A patient with mild systemic                            disease. After reviewing the risks and benefits,                            the patient was deemed in satisfactory condition to                            undergo the procedure.  After obtaining informed consent, the colonoscope                            was passed under direct vision. Throughout the                            procedure, the patient's blood pressure, pulse, and                            oxygen saturations were monitored continuously. The                            Olympus Scope [id number] was introduced through the                            anus  and advanced to the the cecum, identified by                            appendiceal orifice and ileocecal valve. The                            ileocecal valve, appendiceal orifice, and rectum                            were photographed. The quality of the bowel                            preparation was excellent. The colonoscopy was                            performed without difficulty. The patient tolerated                            the procedure well. The bowel preparation used was                            SUPREP via split dose instruction. Scope In: 11:21:52 AM Scope Out: 11:36:22 AM Scope Withdrawal Time: 0 hours 8 minutes 32 seconds  Total Procedure Duration: 0 hours 14 minutes 30 seconds  Findings:                 Multiple diverticula were found in the distal                            transverse colon and left colon.                           The exam was otherwise without abnormality on                            direct and retroflexion views. Complications:            No immediate complications. Estimated blood loss:  None. Estimated Blood Loss:     Estimated blood loss: none. Impression:               - Diverticulosis in the distal transverse colon and                            in the left colon.                           - The examination was otherwise normal on direct                            and retroflexion views.                           - No specimens collected. Recommendation:           - Repeat colonoscopy is not recommended for                            surveillance.                           - Patient has a contact number available for                            emergencies. The signs and symptoms of potential                            delayed complications were discussed with the                            patient. Return to normal activities tomorrow.                            Written discharge instructions were provided to  the                            patient.                           - Resume previous diet.                           - Continue present medications. Wilhemina Bonito. Marina Goodell, MD 03/16/2023 11:40:05 AM This report has been signed electronically.

## 2023-03-16 NOTE — Progress Notes (Signed)
Sedate, gd SR, tolerated procedure well, VSS, report to RN 

## 2023-03-17 ENCOUNTER — Telehealth: Payer: Self-pay | Admitting: *Deleted

## 2023-03-17 DIAGNOSIS — R2689 Other abnormalities of gait and mobility: Secondary | ICD-10-CM | POA: Diagnosis not present

## 2023-03-17 NOTE — Telephone Encounter (Signed)
  Follow up Call-     03/16/2023   10:36 AM 03/16/2023   10:26 AM  Call back number  Post procedure Call Back phone  # [phone]   Permission to leave phone message  Yes     Patient questions:  Do you have a fever, pain , or abdominal swelling? No. Pain Score  0 *  Have you tolerated food without any problems? Yes.    Have you been able to return to your normal activities? Yes.    Do you have any questions about your discharge instructions: Diet   No. Medications  No. Follow up visit  No.  Do you have questions or concerns about your Care? No.  Actions: * If pain score is 4 or above: No action needed, pain <4.

## 2023-03-19 DIAGNOSIS — R2689 Other abnormalities of gait and mobility: Secondary | ICD-10-CM | POA: Diagnosis not present

## 2023-04-08 ENCOUNTER — Encounter: Payer: Self-pay | Admitting: *Deleted

## 2023-04-08 ENCOUNTER — Encounter: Payer: Self-pay | Admitting: Family Medicine

## 2023-04-08 ENCOUNTER — Ambulatory Visit: Payer: Medicare HMO | Admitting: Family Medicine

## 2023-04-08 VITALS — BP 104/72 | HR 64 | Temp 97.9°F | Ht 64.0 in | Wt 141.2 lb

## 2023-04-08 DIAGNOSIS — E1169 Type 2 diabetes mellitus with other specified complication: Secondary | ICD-10-CM

## 2023-04-08 DIAGNOSIS — M7918 Myalgia, other site: Secondary | ICD-10-CM | POA: Diagnosis not present

## 2023-04-08 DIAGNOSIS — Z0000 Encounter for general adult medical examination without abnormal findings: Secondary | ICD-10-CM

## 2023-04-08 DIAGNOSIS — F331 Major depressive disorder, recurrent, moderate: Secondary | ICD-10-CM

## 2023-04-08 DIAGNOSIS — N1831 Chronic kidney disease, stage 3a: Secondary | ICD-10-CM | POA: Diagnosis not present

## 2023-04-08 DIAGNOSIS — M51361 Other intervertebral disc degeneration, lumbar region with lower extremity pain only: Secondary | ICD-10-CM

## 2023-04-08 DIAGNOSIS — Z981 Arthrodesis status: Secondary | ICD-10-CM

## 2023-04-08 DIAGNOSIS — E1122 Type 2 diabetes mellitus with diabetic chronic kidney disease: Secondary | ICD-10-CM

## 2023-04-08 DIAGNOSIS — E785 Hyperlipidemia, unspecified: Secondary | ICD-10-CM | POA: Diagnosis not present

## 2023-04-08 LAB — POCT GLYCOSYLATED HEMOGLOBIN (HGB A1C): Hemoglobin A1C: 6.1 % — AB (ref 4.0–5.6)

## 2023-04-08 MED ORDER — PREDNISONE 20 MG PO TABS: ORAL_TABLET | ORAL | 0 refills | Status: DC

## 2023-04-08 NOTE — Assessment & Plan Note (Signed)
Due for reevaluation 

## 2023-04-08 NOTE — Patient Instructions (Addendum)
Set up yearly eye exam for diabetes and have the opthalmologist send [place] a copy of the evaluation for the chart. Continue home physical therapy, especially stretching out piriformis muscle. Can use heat on low back/buttocks. Complete prednisone taper.  I will move ahead with referral to orthopedic back specialist. If you are unable to see them in a while and pain is not controlled with Tylenol please call for possible course of anti-inflammatory like meloxicam after steroid taper completed.

## 2023-04-08 NOTE — Addendum Note (Signed)
Addended by: Damita Lack on: 04/08/2023 11:36 AM   Modules accepted: Orders

## 2023-04-08 NOTE — Assessment & Plan Note (Signed)
Acute on chronic issue.  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 She will continue doing home physical therapy, can use Tylenol for pain.  Will treat with a course of prednisone taper.   Given her past history of spinal fusion and chronic low back issues I will move forward with referral to back specialist, she has requested orthopedics. She will likely need repeat MRI and possibly would be a good candidate for steroid injection.

## 2023-04-08 NOTE — Assessment & Plan Note (Signed)
LDL  at goal less than 100 on atorvastatin 10 mg daily.

## 2023-04-08 NOTE — Progress Notes (Signed)
Patient ID: Dana Massey, female    DOB: 1946-03-21, 77 y.o.   MRN: [id number]  This visit was conducted in person.  BP 104/72 (BP Location: Right Arm, Patient Position: Sitting, Cuff Size: Normal)   Pulse 64   Temp 97.9 F (36.6 C) (Temporal)   Ht 5\' 4"  (1.626 m)   Wt 141 lb 4 oz (64.1 kg)   LMP 09/13/2000   SpO2 95%   BMI 24.25 kg/m    CC:  Chief Complaint  Patient presents with   Annual Exam    Part 2  ([facility] 01/12/23)    Subjective:   HPI: Dana Massey is a 77 y.o. female presenting on 04/08/2023 for Annual Exam (Part 2  ([facility] 01/12/23)) The patient presents for  complete physical and review of chronic health problems. He/She also has the following acute concerns today: pain in bilateral hips and legs.  Saw neurologist Dr. Sherryll Burger about it.. referred to PT.. in September.. improved while she was doing it then it returns. She continues to do home PT.  Felt secondary to piriformis issue and sciatica. gabapentin 200 mg by mouth in the morning  added by Dr. Sherryll Burger Continue Gabapentin 400 mg at night ... Minimal help with the pain.   Pain has progressed to everyday. Pain in bilateral buttock , radiated to back of thighs. No central  low back pain.  Has weakness in both legs.  History of spinal fusion 1994. Dr. Polo Riley  2022  MRI lumbar spine IMPRESSION: Despite the use of metal artifact reduction sequences, there is severe susceptibility artifact arising from a posterior spinal fusion construct extending from the thoracic spine to the level of the sacrum. This significantly obscures the vertebrae, spinal canal and neural foramina, and significantly limits evaluation. It also significantly limits evaluation for marrow edema/osseous lesions.   At L1-L2, there is advanced disc degeneration on the right. Mild disc bulging and endplate spurring. The spinal canal is fairly well visualized. No apparent spinal canal stenosis at this level. The neural foramina are obscured by  susceptibility artifact.   At L4-L5, there is moderate/advanced disc degeneration with possible fusion across the disc space. Small disc bulge and mild endplate spurring. The spinal canal is well visualized. No significant spinal canal stenosis. The neural foramina are partially obscured by susceptibility artifact. No appreciable foraminal stenosis.   At L5-S1, there is fusion across the disc space. Endplate spurring. The spinal canal is fairly well visualized. No appreciable spinal canal stenosis at this level. No significant neural foraminal narrowing.    The patient saw a LPN or RN for medicare wellness visit. 01/12/2023  Prevention and wellness was reviewed in detail. Note reviewed and important notes copied below. ,: History of melena when followed by dermatology PA Dareen Piano  Osteopenia: On vitamin D and calcium  Major depressive disorder: Well-controlled on sertraline 50 mg p.o. daily Flowsheet Row Clinical Support from 01/12/2023 in [facility]  PHQ-2 Total Score 0        Hyperlipidemia: LDL at goal  < 100 on atorvastatin 10 mg p.o. daily at last check Lab Results  Component Value Date   CHOL 155 01/05/2023   HDL 52.70 01/05/2023   LDLCALC 83 01/05/2023   LDLDIRECT 126.9 11/25/2012   TRIG 98.0 01/05/2023   CHOLHDL 3 01/05/2023   Diabetes:  Lab Results  Component Value Date   HGBA1C 6.6 (H) 01/05/2023  Using medications without difficulties: Hypoglycemic episodes: Hyperglycemic episodes: Feet problems: Blood  Sugars averaging: eye exam within last year: Due Microalbumin: 01/05/2023 Foot exam January 05, 2023  Wt Readings from Last 3 Encounters:  04/08/23 141 lb 4 oz (64.1 kg)  03/16/23 140 lb (63.5 kg)  02/16/23 140 lb (63.5 kg)  ] Elevated Cholesterol: LDL at goal on 10 mg atorvastatin p.o. daily Lab Results  Component Value Date   CHOL 155 01/05/2023   HDL 52.70 01/05/2023   LDLCALC 83 01/05/2023   LDLDIRECT 126.9  11/25/2012   TRIG 98.0 01/05/2023   CHOLHDL 3 01/05/2023  Using medications without problems: Muscle aches:  Diet compliance: moderate Exercise: home PT Other complaints:   OSA:  Stable using CPAP      Relevant past medical, surgical, family and social history reviewed and updated as indicated. Interim medical history since our last visit reviewed. Allergies and medications reviewed and updated. Outpatient Medications Prior to Visit  Medication Sig Dispense Refill   Acetaminophen 500 MG coapsule Take 50 mg by mouth every 6 (six) hours as needed.     aspirin EC 81 MG tablet Take 81 mg by mouth daily.     atorvastatin (LIPITOR) 10 MG tablet Take 1 tablet (10 mg total) by mouth daily. 90 tablet 3   Calcium-Vitamin D-Vitamin K 500-100-40 MG-UNT-MCG CHEW Chew 1 capsule by mouth daily.       dorzolamide-timolol (COSOPT) 22.3-6.8 MG/ML ophthalmic solution Place 1 drop into both eyes in the morning and at bedtime.     fish oil-omega-3 fatty acids 1000 MG capsule Take 2 g by mouth daily.       gabapentin (NEURONTIN) 100 MG capsule Take 200 mg by mouth in the morning.     gabapentin (NEURONTIN) 400 MG capsule Take 400 mg by mouth at bedtime.     latanoprost (XALATAN) 0.005 % ophthalmic solution Place 1 drop into both eyes at bedtime.     MELATONIN PO Take 6 mg by mouth at bedtime as needed.      metoprolol succinate (TOPROL-XL) 25 MG 24 hr tablet TAKE 1 TABLET DAILY WITH OR IMMEDIATELY FOLLOWING A MEAL. 90 tablet 1   Multiple Vitamins-Minerals (MULTIVITAMIN WITH MINERALS) tablet Take 1 tablet by mouth daily.       polyethylene glycol powder (GLYCOLAX/MIRALAX) 17 GM/SCOOP powder Take 17 g by mouth daily as needed.      sertraline (ZOLOFT) 50 MG tablet Take 50 mg by mouth daily.     sertraline (ZOLOFT) 50 MG tablet TAKE 1 AND 1/2 TABLETS EVERY DAY 135 tablet 0   No facility-administered medications prior to visit.     Per HPI unless specifically indicated in ROS section below Review of  Systems  Constitutional:  Negative for fatigue and fever.  HENT:  Negative for congestion.   Eyes:  Negative for pain.  Respiratory:  Negative for cough and shortness of breath.   Cardiovascular:  Negative for chest pain, palpitations and leg swelling.  Gastrointestinal:  Negative for abdominal pain.  Genitourinary:  Negative for dysuria and vaginal bleeding.  Musculoskeletal:  Positive for back pain.  Neurological:  Negative for syncope, light-headedness and headaches.  Psychiatric/Behavioral:  Negative for dysphoric mood.    Objective:  BP 104/72 (BP Location: Right Arm, Patient Position: Sitting, Cuff Size: Normal)   Pulse 64   Temp 97.9 F (36.6 C) (Temporal)   Ht 5\' 4"  (1.626 m)   Wt 141 lb 4 oz (64.1 kg)   LMP 09/13/2000   SpO2 95%   BMI 24.25 kg/m   Wt Readings from Last  3 Encounters:  04/08/23 141 lb 4 oz (64.1 kg)  03/16/23 140 lb (63.5 kg)  02/16/23 140 lb (63.5 kg)      Physical Exam Constitutional:      General: She is not in acute distress.    Appearance: Normal appearance. She is well-developed. She is not ill-appearing or toxic-appearing.  HENT:     Head: Normocephalic.     Right Ear: Hearing, tympanic membrane, ear canal and external ear normal. Tympanic membrane is not erythematous, retracted or bulging.     Left Ear: Hearing, tympanic membrane, ear canal and external ear normal. Tympanic membrane is not erythematous, retracted or bulging.     Nose: No mucosal edema or rhinorrhea.     Right Sinus: No maxillary sinus tenderness or frontal sinus tenderness.     Left Sinus: No maxillary sinus tenderness or frontal sinus tenderness.     Mouth/Throat:     Pharynx: Uvula midline.  Eyes:     General: Lids are normal. Lids are everted, no foreign bodies appreciated.     Conjunctiva/sclera: Conjunctivae normal.     Pupils: Pupils are equal, round, and reactive to light.  Neck:     Thyroid: No thyroid mass or thyromegaly.     Vascular: No carotid bruit.      Trachea: Trachea normal.  Cardiovascular:     Rate and Rhythm: Normal rate and regular rhythm.     Pulses: Normal pulses.     Heart sounds: Normal heart sounds, S1 normal and S2 normal. No murmur heard.    No friction rub. No gallop.  Pulmonary:     Effort: Pulmonary effort is normal. No tachypnea or respiratory distress.     Breath sounds: Normal breath sounds. No decreased breath sounds, wheezing, rhonchi or rales.  Abdominal:     General: Bowel sounds are normal.     Palpations: Abdomen is soft.     Tenderness: There is no abdominal tenderness.  Musculoskeletal:     Cervical back: Normal, normal range of motion and neck supple.     Thoracic back: Normal.     Lumbar back: No tenderness or bony tenderness. Decreased range of motion. Positive right straight leg raise test and positive left straight leg raise test.     Comments: TTP over bilateral sciatic notch.. pain with Bilateral faber's  Skin:    General: Skin is warm and dry.     Findings: No rash.  Neurological:     Mental Status: She is alert.  Psychiatric:        Mood and Affect: Mood is not anxious or depressed.        Speech: Speech normal.        Behavior: Behavior normal. Behavior is cooperative.        Thought Content: Thought content normal.        Judgment: Judgment normal.         Results for orders placed or performed in visit on 01/05/23  Hemoglobin A1c  Result Value Ref Range   Hgb A1c MFr Bld 6.6 (H) 4.6 - 6.5 %  Comprehensive metabolic panel  Result Value Ref Range   Sodium 136 135 - 145 mEq/L   Potassium 4.8 3.5 - 5.1 mEq/L   Chloride 97 96 - 112 mEq/L   CO2 32 19 - 32 mEq/L   Glucose, Bld 97 70 - 99 mg/dL   BUN 21 6 - 23 mg/dL   Creatinine, Ser 2.95 0.40 - 1.20 mg/dL   Total Bilirubin  0.4 0.2 - 1.2 mg/dL   Alkaline Phosphatase 65 39 - 117 U/L   AST 24 0 - 37 U/L   ALT 16 0 - 35 U/L   Total Protein 7.4 6.0 - 8.3 g/dL   Albumin 4.4 3.5 - 5.2 g/dL   GFR 74.25 >95.63 mL/min   Calcium 10.0 8.4 -  10.5 mg/dL  Lipid panel  Result Value Ref Range   Cholesterol 155 0 - 200 mg/dL   Triglycerides 87.5 0.0 - 149.0 mg/dL   HDL 64.33 >29.51 mg/dL   VLDL 88.4 0.0 - 16.6 mg/dL   LDL Cholesterol 83 0 - 99 mg/dL   Total CHOL/HDL Ratio 3    NonHDL 102.28   Microalbumin / creatinine urine ratio  Result Value Ref Range   Microalb, Ur <0.7 0.0 - 1.9 mg/dL   Creatinine,U 06.3 mg/dL   Microalb Creat Ratio 2.7 0.0 - 30.0 mg/g  HM DIABETES FOOT EXAM  Result Value Ref Range   HM Diabetic Foot Exam done     Assessment and Plan The patient's preventative maintenance and recommended screening tests for an annual wellness exam were reviewed in full today. Brought up to date unless services declined.  Counselled on the importance of diet, exercise, and its role in overall health and mortality. The patient's FH and SH was reviewed, including their home life, tobacco status, and drug and alcohol status.   Vaccines: given flu shot, COVID-19 vaccine x 6 Pneumonia vaccine and shingles up-to-date Pap/DVE: Not indicated after age 79 Mammo: December 22, 2022 normal Bone Density: February 17, 2022 osteopenia, repeat every 2 to 5 years Colon: March 16, 2023, no polyps, has diverticulosis, Dr. Marina Goodell,  no furtehr indicated  Smoking Status: None ETOH/ drug use: rare/none  Hep C: Done    Routine general medical examination at a health care facility  MDD (major depressive disorder), recurrent episode, moderate (HCC) Assessment & Plan: Chronic, stable on zoloft 75 mg.   She is considering weaning off this medication as it is well controlled.   Hyperlipidemia associated with type 2 diabetes mellitus (HCC) Assessment & Plan: LDL  at goal less than 100 on atorvastatin 10 mg daily.     Type 2 diabetes mellitus with stage 3a chronic kidney disease, without long-term current use of insulin (HCC) Assessment & Plan:  Due for re-evaluation   Bilateral buttock pain Assessment & Plan: Acute on chronic issue.   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 She will continue doing home physical therapy, can use Tylenol for pain.  Will treat with a course of prednisone taper.   Given her past history of spinal fusion and chronic low back issues I will move forward with referral to back specialist, she has requested orthopedics. She will likely need repeat MRI and possibly would be a good candidate for steroid injection.  Orders: -     Ambulatory referral to Orthopedic Surgery  Degeneration of intervertebral disc of lumbar region with lower extremity pain -     Ambulatory referral to Orthopedic Surgery  History of spinal fusion -     Ambulatory referral to Orthopedic Surgery  Other orders -     predniSONE; 3 tabs by mouth daily x 3 days, then 2 tabs by mouth daily x 2 days then 1 tab by mouth daily x 2 days  Dispense: 15 tablet; Refill: 0     Return in about 6 months (around 10/07/2023) for diabetes follow  up fasting labs prior.   Kerby Nora, MD

## 2023-04-08 NOTE — Assessment & Plan Note (Signed)
Chronic, stable on zoloft 75 mg.   She is considering weaning off this medication as it is well controlled.

## 2023-04-15 DIAGNOSIS — M545 Low back pain, unspecified: Secondary | ICD-10-CM | POA: Diagnosis not present

## 2023-04-15 DIAGNOSIS — M533 Sacrococcygeal disorders, not elsewhere classified: Secondary | ICD-10-CM | POA: Diagnosis not present

## 2023-04-15 DIAGNOSIS — M4186 Other forms of scoliosis, lumbar region: Secondary | ICD-10-CM | POA: Diagnosis not present

## 2023-04-29 ENCOUNTER — Other Ambulatory Visit: Payer: Self-pay | Admitting: Internal Medicine

## 2023-05-20 ENCOUNTER — Encounter: Payer: Self-pay | Admitting: Family Medicine

## 2023-05-21 ENCOUNTER — Ambulatory Visit (INDEPENDENT_AMBULATORY_CARE_PROVIDER_SITE_OTHER): Payer: Medicare HMO | Admitting: Family Medicine

## 2023-05-21 VITALS — BP 106/80 | HR 72 | Temp 97.1°F | Ht 64.0 in | Wt 142.4 lb

## 2023-05-21 DIAGNOSIS — H8110 Benign paroxysmal vertigo, unspecified ear: Secondary | ICD-10-CM

## 2023-05-21 MED ORDER — MELOXICAM 15 MG PO TABS: 15.0000 mg | ORAL_TABLET | Freq: Every day | ORAL | 0 refills | Status: DC

## 2023-05-21 NOTE — Progress Notes (Signed)
Patient ID: TINSLEIGH LARANCE, female    DOB: 18-Mar-1946, 77 y.o.   MRN: [id number]  This visit was conducted in person.  BP 106/80 (BP Location: Left Arm, Patient Position: Sitting, Cuff Size: Normal)   Pulse 72   Temp (!) 97.1 F (36.2 C) (Temporal)   Ht 5\' 4"  (1.626 m)   Wt 142 lb 6 oz (64.6 kg)   LMP 09/13/2000   SpO2 97%   BMI 24.44 kg/m    CC:  Chief Complaint  Patient presents with   Dizziness    Started on Monday    Subjective:   HPI: LEONARDA LECLAIRE is a 77 y.o. female with history of diabetes presenting on 05/21/2023 for Dizziness (Started on Monday)  New onset room spinning sensation started in bed  5 days ago.  Triggered with head movement. Occ causes nausea and dry heaves.   Symptoms are intermittent.   NO HA, 3 weeks ago left ear pain, none now, no current ear pain.  NO fever.   No focal weakness or numbness.   Appetite somewhat better.   Has tried meclizine as needed.  Has had BPPV in past.       Relevant past medical, surgical, family and social history reviewed and updated as indicated. Interim medical history since our last visit reviewed. Allergies and medications reviewed and updated. Outpatient Medications Prior to Visit  Medication Sig Dispense Refill   Acetaminophen 500 MG coapsule Take 50 mg by mouth every 6 (six) hours as needed.     aspirin EC 81 MG tablet Take 81 mg by mouth daily.     atorvastatin (LIPITOR) 10 MG tablet Take 1 tablet (10 mg total) by mouth daily. 90 tablet 3   Calcium-Vitamin D-Vitamin K 500-100-40 MG-UNT-MCG CHEW Chew 1 capsule by mouth daily.       dorzolamide-timolol (COSOPT) 22.3-6.8 MG/ML ophthalmic solution Place 1 drop into both eyes in the morning and at bedtime.     fish oil-omega-3 fatty acids 1000 MG capsule Take 2 g by mouth daily.       gabapentin (NEURONTIN) 100 MG capsule Take 200 mg by mouth in the morning.     gabapentin (NEURONTIN) 400 MG capsule Take 400 mg by mouth at bedtime.      latanoprost (XALATAN) 0.005 % ophthalmic solution Place 1 drop into both eyes at bedtime.     MELATONIN PO Take 6 mg by mouth at bedtime as needed.      metoprolol succinate (TOPROL-XL) 25 MG 24 hr tablet TAKE 1 TABLET DAILY WITH OR IMMEDIATELY FOLLOWING A MEAL. PLEASE SCHEDULE OFFICE VISIT FOR FUTURE REFILLS 90 tablet 3   Multiple Vitamins-Minerals (MULTIVITAMIN WITH MINERALS) tablet Take 1 tablet by mouth daily.       polyethylene glycol powder (GLYCOLAX/MIRALAX) 17 GM/SCOOP powder Take 17 g by mouth daily as needed.      sertraline (ZOLOFT) 50 MG tablet Take 50 mg by mouth daily.     predniSONE (DELTASONE) 20 MG tablet 3 tabs by mouth daily x 3 days, then 2 tabs by mouth daily x 2 days then 1 tab by mouth daily x 2 days 15 tablet 0   No facility-administered medications prior to visit.     Per HPI unless specifically indicated in ROS section below Review of Systems  Neurological:  Positive for dizziness.   Objective:  BP 106/80 (BP Location: Left Arm, Patient Position: Sitting, Cuff Size: Normal)   Pulse 72   Temp (!) 97.1 F (36.2  C) (Temporal)   Ht 5\' 4"  (1.626 m)   Wt 142 lb 6 oz (64.6 kg)   LMP 09/13/2000   SpO2 97%   BMI 24.44 kg/m   Wt Readings from Last 3 Encounters:  05/21/23 142 lb 6 oz (64.6 kg)  04/08/23 141 lb 4 oz (64.1 kg)  03/16/23 140 lb (63.5 kg)      Physical Exam Constitutional:      General: She is not in acute distress.    Appearance: Normal appearance. She is well-developed. She is not ill-appearing or toxic-appearing.  HENT:     Head: Normocephalic.     Right Ear: Hearing, tympanic membrane, ear canal and external ear normal. Tympanic membrane is not erythematous, retracted or bulging.     Left Ear: Hearing, tympanic membrane, ear canal and external ear normal. Tympanic membrane is not erythematous, retracted or bulging.     Nose: No mucosal edema or rhinorrhea.     Right Sinus: No maxillary sinus tenderness or frontal sinus tenderness.     Left  Sinus: No maxillary sinus tenderness or frontal sinus tenderness.     Mouth/Throat:     Pharynx: Uvula midline.  Eyes:     General: Lids are normal. Lids are everted, no foreign bodies appreciated.     Conjunctiva/sclera: Conjunctivae normal.     Pupils: Pupils are equal, round, and reactive to light.  Neck:     Thyroid: No thyroid mass or thyromegaly.     Vascular: No carotid bruit.     Trachea: Trachea normal.  Cardiovascular:     Rate and Rhythm: Normal rate and regular rhythm.     Pulses: Normal pulses.     Heart sounds: Normal heart sounds, S1 normal and S2 normal. No murmur heard.    No friction rub. No gallop.  Pulmonary:     Effort: Pulmonary effort is normal. No tachypnea or respiratory distress.     Breath sounds: Normal breath sounds. No decreased breath sounds, wheezing, rhonchi or rales.  Abdominal:     General: Bowel sounds are normal.     Palpations: Abdomen is soft.     Tenderness: There is no abdominal tenderness.  Musculoskeletal:     Cervical back: Normal range of motion and neck supple.  Skin:    General: Skin is warm and dry.     Findings: No rash.  Neurological:     Mental Status: She is alert.     Cranial Nerves: Cranial nerves 2-12 are intact.     Sensory: Sensation is intact.     Motor: Motor function is intact.     Coordination: Coordination is intact.     Gait: Gait is intact.     Comments: Negative Dix-Hallpike  Psychiatric:        Mood and Affect: Mood is not anxious or depressed.        Speech: Speech normal.        Behavior: Behavior normal. Behavior is cooperative.        Thought Content: Thought content normal.        Judgment: Judgment normal.       Results for orders placed or performed in visit on 04/08/23  HM DIABETES EYE EXAM   Collection Time: 02/25/23 12:17 PM  Result Value Ref Range   HM Diabetic Eye Exam No Retinopathy No Retinopathy    Assessment and Plan  Benign paroxysmal positional vertigo, unspecified  laterality Assessment & Plan: Acute, no clear red flags or indication for imaging.  Symptoms most consistent with BPPV versus viral labyrinthitis. Treat with home D sensitive to certain exercises. Can use meclizine as needed.    Other orders -     Meloxicam; Take 1 tablet (15 mg total) by mouth daily.  Dispense: 90 tablet; Refill: 0    No follow-ups on file.   Kerby Nora, MD

## 2023-06-02 DIAGNOSIS — M461 Sacroiliitis, not elsewhere classified: Secondary | ICD-10-CM | POA: Diagnosis not present

## 2023-06-02 DIAGNOSIS — E119 Type 2 diabetes mellitus without complications: Secondary | ICD-10-CM | POA: Diagnosis not present

## 2023-06-06 NOTE — Assessment & Plan Note (Addendum)
Acute, no clear red flags or indication for imaging.  Symptoms most consistent with BPPV versus viral labyrinthitis. Treat with home D sensitive to certain exercises. Can use meclizine as needed.  Return and ER precautions provided.

## 2023-07-01 DIAGNOSIS — G629 Polyneuropathy, unspecified: Secondary | ICD-10-CM | POA: Diagnosis not present

## 2023-07-01 DIAGNOSIS — R251 Tremor, unspecified: Secondary | ICD-10-CM | POA: Diagnosis not present

## 2023-07-01 DIAGNOSIS — H8110 Benign paroxysmal vertigo, unspecified ear: Secondary | ICD-10-CM | POA: Diagnosis not present

## 2023-07-01 DIAGNOSIS — R2689 Other abnormalities of gait and mobility: Secondary | ICD-10-CM | POA: Diagnosis not present

## 2023-07-01 DIAGNOSIS — G43009 Migraine without aura, not intractable, without status migrainosus: Secondary | ICD-10-CM | POA: Diagnosis not present

## 2023-07-01 DIAGNOSIS — M533 Sacrococcygeal disorders, not elsewhere classified: Secondary | ICD-10-CM | POA: Diagnosis not present

## 2023-07-01 DIAGNOSIS — M1990 Unspecified osteoarthritis, unspecified site: Secondary | ICD-10-CM | POA: Diagnosis not present

## 2023-07-20 ENCOUNTER — Encounter: Payer: Self-pay | Admitting: Family Medicine

## 2023-07-20 DIAGNOSIS — C4339 Malignant melanoma of other parts of face: Secondary | ICD-10-CM | POA: Diagnosis not present

## 2023-07-20 DIAGNOSIS — L570 Actinic keratosis: Secondary | ICD-10-CM | POA: Diagnosis not present

## 2023-07-20 DIAGNOSIS — L821 Other seborrheic keratosis: Secondary | ICD-10-CM | POA: Diagnosis not present

## 2023-07-20 DIAGNOSIS — Z08 Encounter for follow-up examination after completed treatment for malignant neoplasm: Secondary | ICD-10-CM | POA: Diagnosis not present

## 2023-07-20 DIAGNOSIS — D2371 Other benign neoplasm of skin of right lower limb, including hip: Secondary | ICD-10-CM | POA: Diagnosis not present

## 2023-07-20 DIAGNOSIS — L814 Other melanin hyperpigmentation: Secondary | ICD-10-CM | POA: Diagnosis not present

## 2023-07-20 DIAGNOSIS — Z85820 Personal history of malignant melanoma of skin: Secondary | ICD-10-CM | POA: Diagnosis not present

## 2023-08-26 DIAGNOSIS — H40053 Ocular hypertension, bilateral: Secondary | ICD-10-CM | POA: Diagnosis not present

## 2023-09-11 ENCOUNTER — Other Ambulatory Visit: Payer: Self-pay | Admitting: Family Medicine

## 2023-09-13 ENCOUNTER — Ambulatory Visit: Payer: Medicare HMO | Admitting: Internal Medicine

## 2023-09-13 MED ORDER — SERTRALINE HCL 50 MG PO TABS: 50.0000 mg | ORAL_TABLET | Freq: Every day | ORAL | 1 refills | Status: DC

## 2023-09-27 ENCOUNTER — Encounter: Payer: Self-pay | Admitting: Family Medicine

## 2023-09-30 ENCOUNTER — Encounter: Payer: Self-pay | Admitting: Family Medicine

## 2023-09-30 ENCOUNTER — Other Ambulatory Visit (INDEPENDENT_AMBULATORY_CARE_PROVIDER_SITE_OTHER)

## 2023-09-30 ENCOUNTER — Telehealth: Payer: Self-pay | Admitting: Family Medicine

## 2023-09-30 DIAGNOSIS — N1831 Chronic kidney disease, stage 3a: Secondary | ICD-10-CM | POA: Diagnosis not present

## 2023-09-30 DIAGNOSIS — E1122 Type 2 diabetes mellitus with diabetic chronic kidney disease: Secondary | ICD-10-CM

## 2023-09-30 DIAGNOSIS — E1169 Type 2 diabetes mellitus with other specified complication: Secondary | ICD-10-CM

## 2023-09-30 LAB — COMPREHENSIVE METABOLIC PANEL WITH GFR
ALT: 21 U/L (ref 0–35)
AST: 28 U/L (ref 0–37)
Albumin: 4.5 g/dL (ref 3.5–5.2)
Alkaline Phosphatase: 55 U/L (ref 39–117)
BUN: 14 mg/dL (ref 6–23)
CO2: 36 meq/L — ABNORMAL HIGH (ref 19–32)
Calcium: 9.3 mg/dL (ref 8.4–10.5)
Chloride: 97 meq/L (ref 96–112)
Creatinine, Ser: 0.8 mg/dL (ref 0.40–1.20)
GFR: 71.26 mL/min (ref >60.00)
Glucose, Bld: 92 mg/dL (ref 70–99)
Potassium: 4.4 meq/L (ref 3.5–5.1)
Sodium: 137 meq/L (ref 135–145)
Total Bilirubin: 0.5 mg/dL (ref 0.2–1.2)
Total Protein: 6.7 g/dL (ref 6.0–8.3)

## 2023-09-30 LAB — LIPID PANEL
Cholesterol: 147 mg/dL (ref 0–200)
HDL: 59.7 mg/dL (ref >39.00)
LDL Cholesterol: 70 mg/dL (ref 0–99)
NonHDL: 86.98
Total CHOL/HDL Ratio: 2
Triglycerides: 87 mg/dL (ref 0.0–149.0)
VLDL: 17.4 mg/dL (ref 0.0–40.0)

## 2023-09-30 LAB — HEMOGLOBIN A1C: Hgb A1c MFr Bld: 6.3 % (ref 4.6–6.5)

## 2023-09-30 NOTE — Telephone Encounter (Signed)
-----   Message from Gerry Krone sent at 09/27/2023  2:12 PM EDT ----- Regarding: Lab orders for [facility], 4.17.25 Lab orders for a 6 month follow up appt

## 2023-09-30 NOTE — Progress Notes (Signed)
 No critical labs need to be addressed urgently. We will discuss labs in detail at upcoming office visit.

## 2023-10-07 ENCOUNTER — Encounter: Payer: Self-pay | Admitting: Family Medicine

## 2023-10-07 ENCOUNTER — Ambulatory Visit (INDEPENDENT_AMBULATORY_CARE_PROVIDER_SITE_OTHER): Payer: Medicare HMO | Admitting: Family Medicine

## 2023-10-07 VITALS — BP 108/64 | HR 59 | Temp 97.1°F | Ht 64.0 in | Wt 145.1 lb

## 2023-10-07 DIAGNOSIS — N1831 Chronic kidney disease, stage 3a: Secondary | ICD-10-CM

## 2023-10-07 DIAGNOSIS — E1122 Type 2 diabetes mellitus with diabetic chronic kidney disease: Secondary | ICD-10-CM

## 2023-10-07 DIAGNOSIS — E785 Hyperlipidemia, unspecified: Secondary | ICD-10-CM | POA: Diagnosis not present

## 2023-10-07 DIAGNOSIS — E1169 Type 2 diabetes mellitus with other specified complication: Secondary | ICD-10-CM | POA: Diagnosis not present

## 2023-10-07 DIAGNOSIS — M5442 Lumbago with sciatica, left side: Secondary | ICD-10-CM

## 2023-10-07 NOTE — Progress Notes (Signed)
 Patient ID: Dana Massey, female    DOB: Jun 20, 1945, 78 y.o.   MRN: [id number]  This visit was conducted in person.  BP 108/64 (BP Location: Left Arm, Patient Position: Sitting, Cuff Size: Normal)   Pulse (!) 59   Temp (!) 97.1 F (36.2 C) (Temporal)   Ht 5\' 4"  (1.626 m)   Wt 145 lb 2 oz (65.8 kg)   LMP 09/13/2000   SpO2 96%   BMI 24.91 kg/m    CC:  Chief Complaint  Patient presents with  . Diabetes    Subjective:   HPI: Dana Massey is a 78 y.o. female presenting on 10/07/2023 for  follow up     Has been having continued pain in low back pain,  left   > right buttock, radiating to posterior thigh  Has numbness right leg Dx with SI joint pain... treated with prednisone , meloxicam  and gabapentin with minimal benefit.  ESI at [facility] in 05/2023.. no relief.  Pain 3/10 pain scale today... constant, even with sitting.   Lab Results  Component Value Date   CHOL 147 09/30/2023   HDL 59.70 09/30/2023   LDLCALC 70 09/30/2023   LDLDIRECT 126.9 11/25/2012   TRIG 87.0 09/30/2023   CHOLHDL 2 09/30/2023   Diabetes:  Lab Results  Component Value Date   HGBA1C 6.3 09/30/2023  Using medications without difficulties: Hypoglycemic episodes: Hyperglycemic episodes: Feet problems: Blood Sugars averaging: eye exam within last year: Due Microalbumin: 01/05/2023 Foot exam January 05, 2023  Wt Readings from Last 3 Encounters:  10/26/23 141 lb 3.2 oz (64 kg)  10/07/23 145 lb 2 oz (65.8 kg)  05/21/23 142 lb 6 oz (64.6 kg)  ] Elevated Cholesterol: LDL at goal on 10 mg atorvastatin  p.o. daily Lab Results  Component Value Date   CHOL 147 09/30/2023   HDL 59.70 09/30/2023   LDLCALC 70 09/30/2023   LDLDIRECT 126.9 11/25/2012   TRIG 87.0 09/30/2023   CHOLHDL 2 09/30/2023  Using medications without problems: none Muscle aches:  none Diet compliance: moderate Exercise: home PT       Relevant past medical, surgical, family and social history reviewed and updated  as indicated. Interim medical history since our last visit reviewed. Allergies and medications reviewed and updated. Outpatient Medications Prior to Visit  Medication Sig Dispense Refill  . Acetaminophen 500 MG coapsule Take 50 mg by mouth every 6 (six) hours as needed.    Aaron Aas aspirin EC 81 MG tablet Take 81 mg by mouth daily.    . Calcium -Vitamin D -Vitamin K 500-100-40 MG-UNT-MCG CHEW Chew 1 capsule by mouth daily.      . dorzolamide-timolol (COSOPT) 22.3-6.8 MG/ML ophthalmic solution Place 1 drop into both eyes in the morning and at bedtime.    . fish oil-omega-3 fatty acids 1000 MG capsule Take 2 g by mouth daily.      Aaron Aas gabapentin (NEURONTIN) 400 MG capsule Take 400 mg by mouth 2 (two) times daily.    Aaron Aas latanoprost (XALATAN) 0.005 % ophthalmic solution Place 1 drop into both eyes at bedtime.    . Meclizine  HCl 25 MG CHEW Chew 1-2 tablets by mouth daily as needed (Veritgo).    . meloxicam  (MOBIC ) 15 MG tablet Take 15 mg by mouth daily as needed for pain.    . metoprolol  succinate (TOPROL -XL) 25 MG 24 hr tablet TAKE 1 TABLET DAILY WITH OR IMMEDIATELY FOLLOWING A MEAL. PLEASE SCHEDULE OFFICE VISIT FOR FUTURE REFILLS 90 tablet 3  . Multiple Vitamins-Minerals (  MULTIVITAMIN WITH MINERALS) tablet Take 1 tablet by mouth daily.      . polyethylene glycol powder (GLYCOLAX/MIRALAX) 17 GM/SCOOP powder Take 17 g by mouth daily as needed.     . atorvastatin  (LIPITOR) 10 MG tablet Take 1 tablet (10 mg total) by mouth daily. 90 tablet 3  . sertraline  (ZOLOFT ) 50 MG tablet Take 1 tablet (50 mg total) by mouth daily. 90 tablet 1  . gabapentin (NEURONTIN) 100 MG capsule Take 200 mg by mouth in the morning.    Aaron Aas MELATONIN PO Take 6 mg by mouth at bedtime as needed.     . meloxicam  (MOBIC ) 15 MG tablet Take 1 tablet (15 mg total) by mouth daily. (Patient taking differently: Take 15 mg by mouth daily as needed for pain.) 90 tablet 0   No facility-administered medications prior to visit.     Per HPI unless  specifically indicated in ROS section below Review of Systems  Constitutional:  Negative for fatigue and fever.  HENT:  Negative for congestion.   Eyes:  Negative for pain.  Respiratory:  Negative for cough and shortness of breath.   Cardiovascular:  Negative for chest pain, palpitations and leg swelling.  Gastrointestinal:  Negative for abdominal pain.  Genitourinary:  Negative for dysuria and vaginal bleeding.  Musculoskeletal:  Positive for back pain.  Neurological:  Negative for syncope, light-headedness and headaches.  Psychiatric/Behavioral:  Negative for dysphoric mood.    Objective:  BP 108/64 (BP Location: Left Arm, Patient Position: Sitting, Cuff Size: Normal)   Pulse (!) 59   Temp (!) 97.1 F (36.2 C) (Temporal)   Ht 5\' 4"  (1.626 m)   Wt 145 lb 2 oz (65.8 kg)   LMP 09/13/2000   SpO2 96%   BMI 24.91 kg/m   Wt Readings from Last 3 Encounters:  10/26/23 141 lb 3.2 oz (64 kg)  10/07/23 145 lb 2 oz (65.8 kg)  05/21/23 142 lb 6 oz (64.6 kg)      Physical Exam Constitutional:      General: She is not in acute distress.    Appearance: Normal appearance. She is well-developed. She is not ill-appearing or toxic-appearing.  HENT:     Head: Normocephalic.     Right Ear: Hearing, tympanic membrane, ear canal and external ear normal. Tympanic membrane is not erythematous, retracted or bulging.     Left Ear: Hearing, tympanic membrane, ear canal and external ear normal. Tympanic membrane is not erythematous, retracted or bulging.     Nose: No mucosal edema or rhinorrhea.     Right Sinus: No maxillary sinus tenderness or frontal sinus tenderness.     Left Sinus: No maxillary sinus tenderness or frontal sinus tenderness.     Mouth/Throat:     Pharynx: Uvula midline.  Eyes:     General: Lids are normal. Lids are everted, no foreign bodies appreciated.     Conjunctiva/sclera: Conjunctivae normal.     Pupils: Pupils are equal, round, and reactive to light.  Neck:     Thyroid :  No thyroid  mass or thyromegaly.     Vascular: No carotid bruit.     Trachea: Trachea normal.  Cardiovascular:     Rate and Rhythm: Normal rate and regular rhythm.     Pulses: Normal pulses.     Heart sounds: Normal heart sounds, S1 normal and S2 normal. No murmur heard.    No friction rub. No gallop.  Pulmonary:     Effort: Pulmonary effort is normal. No tachypnea or  respiratory distress.     Breath sounds: Normal breath sounds. No decreased breath sounds, wheezing, rhonchi or rales.  Abdominal:     General: Bowel sounds are normal.     Palpations: Abdomen is soft.     Tenderness: There is no abdominal tenderness.  Musculoskeletal:     Cervical back: Normal, normal range of motion and neck supple.     Thoracic back: Normal.     Lumbar back: No tenderness or bony tenderness. Decreased range of motion. Positive right straight leg raise test and positive left straight leg raise test.     Comments: TTP over bilateral sciatic notch.. pain with Bilateral faber's  Skin:    General: Skin is warm and dry.     Findings: No rash.  Neurological:     Mental Status: She is alert.  Psychiatric:        Mood and Affect: Mood is not anxious or depressed.        Speech: Speech normal.        Behavior: Behavior normal. Behavior is cooperative.        Thought Content: Thought content normal.        Judgment: Judgment normal.        Results for orders placed or performed in visit on 09/30/23  Comprehensive metabolic panel with GFR   Collection Time: 09/30/23 11:53 AM  Result Value Ref Range   Sodium 137 135 - 145 mEq/L   Potassium 4.4 3.5 - 5.1 mEq/L   Chloride 97 96 - 112 mEq/L   CO2 36 (H) 19 - 32 mEq/L   Glucose, Bld 92 70 - 99 mg/dL   BUN 14 6 - 23 mg/dL   Creatinine, Ser 1.47 0.40 - 1.20 mg/dL   Total Bilirubin 0.5 0.2 - 1.2 mg/dL   Alkaline Phosphatase 55 39 - 117 U/L   AST 28 0 - 37 U/L   ALT 21 0 - 35 U/L   Total Protein 6.7 6.0 - 8.3 g/dL   Albumin 4.5 3.5 - 5.2 g/dL   GFR  82.95 >62.13 mL/min   Calcium  9.3 8.4 - 10.5 mg/dL  Lipid panel   Collection Time: 09/30/23 11:53 AM  Result Value Ref Range   Cholesterol 147 0 - 200 mg/dL   Triglycerides 08.6 0.0 - 149.0 mg/dL   HDL 57.84 >69.62 mg/dL   VLDL 95.2 0.0 - 84.1 mg/dL   LDL Cholesterol 70 0 - 99 mg/dL   Total CHOL/HDL Ratio 2    NonHDL 86.98   Hemoglobin A1c   Collection Time: 09/30/23 11:53 AM  Result Value Ref Range   Hgb A1c MFr Bld 6.3 4.6 - 6.5 %    Assessment and Plan   Type 2 diabetes mellitus with stage 3a chronic kidney disease, without long-term current use of insulin (HCC) Assessment & Plan: Chronic, diet controlled.  Improvement with recent lifestyle changes.   Hyperlipidemia associated with type 2 diabetes mellitus (HCC) Assessment & Plan: LDL  at goal less than 100 on atorvastatin  10 mg daily.     Acute midline low back pain with left-sided sciatica Assessment & Plan: Acute flare of chronic issue  Has been having continued pain in low back pain,  left   > right buttock, radiating to posterior thigh  Has numbness right leg Dx with SI joint pain... treated with prednisone , meloxicam  and gabapentin with minimal benefit.  ESI at [facility] in 05/2023.. no relief.  Pain 3/10 pain scale today... constant, even with sitting.  Start  home gentle strengthening exercises.       No follow-ups on file.   Herby Lolling, MD

## 2023-10-25 ENCOUNTER — Encounter: Payer: Self-pay | Admitting: Family Medicine

## 2023-10-25 MED ORDER — DULOXETINE HCL 30 MG PO CPEP
30.0000 mg | ORAL_CAPSULE | Freq: Every day | ORAL | 1 refills | Status: DC
Start: 2023-10-25 — End: 2023-11-05

## 2023-10-26 ENCOUNTER — Ambulatory Visit: Attending: Internal Medicine | Admitting: Internal Medicine

## 2023-10-26 ENCOUNTER — Encounter: Payer: Self-pay | Admitting: Internal Medicine

## 2023-10-26 VITALS — BP 122/72 | HR 71 | Ht 64.0 in | Wt 141.2 lb

## 2023-10-26 DIAGNOSIS — R002 Palpitations: Secondary | ICD-10-CM | POA: Diagnosis not present

## 2023-10-26 NOTE — Patient Instructions (Signed)
 Medication Instructions:  Your physician recommends that you continue on your current medications as directed. Please refer to the Current Medication list given to you today.  *If you need a refill on your cardiac medications before your next appointment, please call your pharmacy*  Lab Work: None ordered.  You may go to any [facility] Location for your lab work:  [facility] - [id number] ([facility]) - [phone] [address]  [place] - [address]   [place]  - [address]   [place] - [address] - [address] [employer]  - [address] [place] - [address] ([facility]   If you have labs (blood work) drawn today and your tests are completely normal, you will receive your results only by: [employer] (if you have MyChart)  If you have any lab test that is abnormal or we need to change your treatment, we will call you or send a MyChart message to review the results.  Testing/Procedures: None ordered.  Follow-Up: At [facility], you and your health needs are our priority.  As part of our continuing mission to provide you with exceptional heart care, we have created designated Provider Care Teams.  These Care Teams include your primary Cardiologist (physician) and Advanced Practice Providers (APPs -  Physician Assistants and Nurse Practitioners) who all work together to provide you with the care you need, when you need it.  We recommend signing up for the patient portal called "MyChart".  Sign up information is provided on this After Visit Summary.  MyChart is used to connect with patients for Virtual Visits (Telemedicine).  Patients are able to view lab/test results, encounter notes, upcoming appointments, etc.  Non-urgent messages can be sent to your provider as well.   To learn more about what you can do with MyChart, go to  [email or web address].    Your next appointment:   As needed  The format for your next appointment:   In Person  Provider:   Manya Sells, MD{or one of the following Advanced Practice Providers on your designated Care Team:   Mertha Abrahams, [place] Bambi Lever "Jonelle Neri" [place], [place] Neda Balk, NP  Note: Remote monitoring is used to monitor your Pacemaker/ ICD from home. This monitoring reduces the number of office visits required to check your device to one time per year. It allows us  to keep an eye on the functioning of your device to ensure it is working properly.

## 2023-10-26 NOTE — Progress Notes (Signed)
 HPI Dana Massey returns today for ongoing evaluation of palpitations due to atrial arrhythmias. She has been prescribed metoprolol  and been taking it daily. In the interim she notes no symptomatic palpitations. She denies chest pain or sob. No syncope. No edema. She has developed some peripheral neuropathy for which she is taking neurontin. She has been more sedentary. Allergies  Allergen Reactions   Wasp Venom Anaphylaxis     Current Outpatient Medications  Medication Sig Dispense Refill   Acetaminophen 500 MG coapsule Take 50 mg by mouth every 6 (six) hours as needed.     aspirin EC 81 MG tablet Take 81 mg by mouth daily.     atorvastatin  (LIPITOR) 10 MG tablet Take 1 tablet (10 mg total) by mouth daily. 90 tablet 3   Calcium -Vitamin D -Vitamin K 500-100-40 MG-UNT-MCG CHEW Chew 1 capsule by mouth daily.       dorzolamide-timolol (COSOPT) 22.3-6.8 MG/ML ophthalmic solution Place 1 drop into both eyes in the morning and at bedtime.     DULoxetine (CYMBALTA) 30 MG capsule Take 1 capsule (30 mg total) by mouth daily. 30 capsule 1   fish oil-omega-3 fatty acids 1000 MG capsule Take 2 g by mouth daily.       gabapentin (NEURONTIN) 400 MG capsule Take 400 mg by mouth 2 (two) times daily.     latanoprost (XALATAN) 0.005 % ophthalmic solution Place 1 drop into both eyes at bedtime.     Meclizine  HCl 25 MG CHEW Chew 1-2 tablets by mouth daily as needed (Veritgo).     meloxicam  (MOBIC ) 15 MG tablet Take 15 mg by mouth daily as needed for pain.     metoprolol  succinate (TOPROL -XL) 25 MG 24 hr tablet TAKE 1 TABLET DAILY WITH OR IMMEDIATELY FOLLOWING A MEAL. PLEASE SCHEDULE OFFICE VISIT FOR FUTURE REFILLS 90 tablet 3   Multiple Vitamins-Minerals (MULTIVITAMIN WITH MINERALS) tablet Take 1 tablet by mouth daily.       polyethylene glycol powder (GLYCOLAX/MIRALAX) 17 GM/SCOOP powder Take 17 g by mouth daily as needed.      No current facility-administered medications for this visit.     Past  Medical History:  Diagnosis Date   Cataract    left eye surgery to remove, right eye just watching currently   Constipation    Depression    Diabetes mellitus without complication (HCC)    Diverticulosis of colon    Glaucoma    ocular htn bilateral eyes - uses drops    History of blood transfusion 1994   with spinal fusion surgery   IBS (irritable bowel syndrome)    Migraine headache    resolved, no current problem   Mitral valve prolapse    patient denies this dx   Neuromuscular disorder (HCC)    essential tremor hands   Osteopenia    Osteoporosis    PAC (premature atrial contraction)    on metoprolol    Palpitations    Scoliosis    Shoulder pain    resolved, no loonger a problem   Sleep apnea    Toxic effect of venom(989.5)    Venous insufficiency     ROS:   All systems reviewed and negative except as noted in the HPI.   Past Surgical History:  Procedure Laterality Date   BACK SURGERY  1994   scoliosis at [facility]   CATARACT EXTRACTION Left 04/2010   COLONOSCOPY  2009   [place]- Normal   ROTATOR CUFF REPAIR  2006  WISDOM TOOTH EXTRACTION       Family History  Problem Relation Age of Onset   Diabetes Mother    Cancer Father        melanoma   Stroke Paternal Grandmother 30   Colon cancer Neg Hx    Rectal cancer Neg Hx    Stomach cancer Neg Hx    Esophageal cancer Neg Hx    Colon polyps Neg Hx      Social History   Socioeconomic History   Marital status: Married    Spouse name: Landon Pinion   Number of children: 2   Years of education: associate degree   Highest education level: Associate degree: occupational, Scientist, product/process development, or vocational program  Occupational History   Not on file  Tobacco Use   Smoking status: Never   Smokeless tobacco: Never  Vaping Use   Vaping status: Never Used  Substance and Sexual Activity   Alcohol use: Never    Comment: rarely - holiday   Drug use: No   Sexual activity: Yes    Partners: Male    Birth  control/protection: Post-menopausal    Comment: husband vasectomy  Other Topics Concern   Not on file  Social History Narrative   Does not have a living will.   Desires CPR but would not want prolonged life support.   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       Family: 2 children - Zoila Hines and Corporate treasurer - both [place], 3 grandchildren (age 98 to 44)      Enjoys: reading, knitting, exercise, watch TV,       Exercise: 4 times a week - 45 minute senior fitness class   Diet: tries to eat right - fish a few times a week, no beef, not as many veggies as she should      Safety   Seat belts: Yes    Guns: Yes  and secure   Safe in relationships: Yes          Social Drivers of Corporate investment banker Strain: Low Risk  (10/03/2023)   Overall Financial Resource Strain (CARDIA)    Difficulty of Paying Living Expenses: Not hard at all  Food Insecurity: No Food Insecurity (10/03/2023)   Hunger Vital Sign    Worried About Running Out of Food in the Last Year: Never true    Ran Out of Food in the Last Year: Never true  Transportation Needs: No Transportation Needs (10/03/2023)   PRAPARE - Administrator, Civil Service (Medical): No    Lack of Transportation (Non-Medical): No  Physical Activity: Inactive (10/03/2023)   Exercise Vital Sign    Days of Exercise per Week: 0 days    Minutes of Exercise per Session: 20 min  Stress: No Stress Concern Present (10/03/2023)   [employer] of Occupational Health - Occupational Stress Questionnaire    Feeling of Stress : Only a little  Social Connections: Socially Isolated (10/03/2023)   Social Connection and Isolation Panel [NHANES]    Frequency of Communication with Friends and Family: Never    Frequency of Social Gatherings with Friends and Family: Once a week    Attends Religious Services:  Never    Database administrator or Organizations: No    Attends Banker Meetings: Never    Marital Status: Married  Intimate Partner Violence: Not At Risk (01/12/2023)   Humiliation, Afraid, Rape, and Kick questionnaire    Fear of Current or Ex-Partner: No    Emotionally Abused: No    Physically Abused: No    Sexually Abused: No     BP 122/72   Pulse 71   Ht 5\' 4"  (1.626 m)   Wt 141 lb 3.2 oz (64 kg)   LMP 09/13/2000   SpO2 94%   BMI 24.24 kg/m   Physical Exam:  Well appearing NAD HEENT: Unremarkable Neck:  No JVD, no thyromegally Lymphatics:  No adenopathy Back:  No CVA tenderness Lungs:  Clear with no wheezes HEART:  Regular rate rhythm, no murmurs, no rubs, no clicks Abd:  soft, positive bowel sounds, no organomegally, no rebound, no guarding Ext:  2 plus pulses, no edema, no cyanosis, no clubbing Skin:  No rashes no nodules Neuro:  CN II through XII intact, motor grossly intact  EKG - nsr   Assess/Plan:  1. Palpitations - her atrial arrhythmias have essentially resolved on toprol . She will continue. She does not use caffeine or ETOH and she notes that she gets plenty of sleep. 2. Sleep apnea - she was found to have an increased HCT and evaluation demonstrated sleep apnea. She is on CPAP and feels well.   Debbi Failing.D

## 2023-10-27 ENCOUNTER — Other Ambulatory Visit: Payer: Self-pay | Admitting: Family Medicine

## 2023-10-27 DIAGNOSIS — M5442 Lumbago with sciatica, left side: Secondary | ICD-10-CM | POA: Insufficient documentation

## 2023-10-27 DIAGNOSIS — E782 Mixed hyperlipidemia: Secondary | ICD-10-CM

## 2023-10-27 NOTE — Assessment & Plan Note (Addendum)
 Acute flare of chronic issue  Has been having continued pain in low back pain,  left   > right buttock, radiating to posterior thigh  Has numbness right leg Dx with SI joint pain... treated with prednisone , meloxicam  and gabapentin with minimal benefit.  ESI at [facility] in 05/2023.. no relief.  Pain 3/10 pain scale today... constant, even with sitting.  Start home gentle strengthening exercises. Follow-up with Ortho if not improving as expected.

## 2023-10-27 NOTE — Assessment & Plan Note (Signed)
 Chronic, diet controlled.  Improvement with recent lifestyle changes.

## 2023-10-27 NOTE — Assessment & Plan Note (Signed)
LDL  at goal less than 100 on atorvastatin 10 mg daily.

## 2023-11-04 ENCOUNTER — Encounter: Payer: Self-pay | Admitting: Family Medicine

## 2023-11-05 ENCOUNTER — Other Ambulatory Visit: Payer: Self-pay | Admitting: Family Medicine

## 2023-11-05 DIAGNOSIS — M461 Sacroiliitis, not elsewhere classified: Secondary | ICD-10-CM

## 2023-11-05 MED ORDER — DULOXETINE HCL 60 MG PO CPEP: 60.0000 mg | ORAL_CAPSULE | Freq: Every day | ORAL | 1 refills | Status: DC

## 2023-11-15 ENCOUNTER — Other Ambulatory Visit: Payer: Self-pay | Admitting: Family Medicine

## 2023-11-15 NOTE — Telephone Encounter (Signed)
 Last office visit 10/07/2023 for DM.  Last refilled 05/21/2023 for #90 with no refills.  Next Appt: No future appointments with PCP.

## 2023-11-24 ENCOUNTER — Ambulatory Visit: Admitting: Orthopedic Surgery

## 2023-11-25 ENCOUNTER — Encounter: Payer: Self-pay | Admitting: Family Medicine

## 2023-11-25 DIAGNOSIS — M8589 Other specified disorders of bone density and structure, multiple sites: Secondary | ICD-10-CM

## 2023-11-25 NOTE — Telephone Encounter (Signed)
 Please place order for Dexa Scan, then send message back to me to fax order to [facility].

## 2023-12-06 ENCOUNTER — Encounter: Payer: Self-pay | Admitting: Physical Medicine and Rehabilitation

## 2023-12-06 ENCOUNTER — Other Ambulatory Visit (INDEPENDENT_AMBULATORY_CARE_PROVIDER_SITE_OTHER): Payer: Self-pay

## 2023-12-06 ENCOUNTER — Ambulatory Visit (INDEPENDENT_AMBULATORY_CARE_PROVIDER_SITE_OTHER): Admitting: Physical Medicine and Rehabilitation

## 2023-12-06 DIAGNOSIS — M5442 Lumbago with sciatica, left side: Secondary | ICD-10-CM

## 2023-12-06 DIAGNOSIS — M5441 Lumbago with sciatica, right side: Secondary | ICD-10-CM

## 2023-12-06 DIAGNOSIS — G8929 Other chronic pain: Secondary | ICD-10-CM

## 2023-12-06 DIAGNOSIS — M961 Postlaminectomy syndrome, not elsewhere classified: Secondary | ICD-10-CM

## 2023-12-06 DIAGNOSIS — M5416 Radiculopathy, lumbar region: Secondary | ICD-10-CM

## 2023-12-06 NOTE — Progress Notes (Unsigned)
 Pain Scale   Average Pain 2 Patient advising she has chronic lower back pain radiating bilaterally but mostly to right leg        +Driver, -BT, -Dye Allergies.

## 2023-12-06 NOTE — Progress Notes (Unsigned)
 Dana Massey - 78 y.o. female MRN [id number]  Date of birth: Nov 06, 1945  Office Visit Note: Visit Date: 12/06/2023 PCP: Avelina Greig BRAVO, MD Referred by: Avelina Greig BRAVO, MD  Subjective: No chief complaint on file.  HPI: Dana Massey is a 78 y.o. female who comes in today per the request of Dr. Greig Avelina for evaluation of chronic, worsening and severe bilateral lower back pain radiating to buttocks and down posterior legs to knees, left greater than right. Pain ongoing for several years, worsened about 4 years ago. Does have history of peripheral neuropathy to right lower extremity. She underwent bilateral lower extremity nerve study in 2022 at [facility], this shows generalized sensory polyneuropathy. Her pain worsens with prolonged sitting and standing. She describes her pain as throbbing and shooting sensation, currently rates 2 out of 10. Some relief of pain with home exercise regimen, ice/heat, rest and use of medications. History of formal physical therapy with some relief of pain. She is taking Gabapentin and Cymbalta  daily. Lumbar MRI imaging from 2022 shows posterior spinal fusion construct extending from the thoracic spine to the level of the sacrum. She underwent fairly significant surgery to correct scoliosis in 1994. More recently, she was evaluated by Dr. Rankin Rogue with [facility], underwent bilateral SI joint injections in December of 2024. No relief of pain with these injections. Patient denies focal weakness. She reports recent fall on Saturday evening onto left side, bruising on left arm and lateral hip region, no apparent injuries. Husband states she falls frequently due to balance issues. She does use cane to assist with ambulation.       Review of Systems  Musculoskeletal:  Positive for back pain.  Neurological:  Positive for tingling and focal weakness.  All other systems reviewed and are negative.  Otherwise per HPI.  Assessment & Plan: Visit Diagnoses:     ICD-10-CM   1. Lumbar radiculopathy  M54.16        Plan: Findings:  Chronic, worsening and severe bilateral lower back pain radiating to buttocks and down posterior legs to knees, left greater than right. Patient continues to have severe pain despite good conservative therapies such as formal physical therapy, home exercise regimen, rest and use of medications. I obtained lumbar radiographs in the office today that show levocurvature, extensive prior posterior lumbar fusion extending up to thoracic spine and down to sacrum. No relief with prior bilateral SI joint injections in 2024. I do think her pain is stemming more from lumbar spine, her pain distribution is consistent with S1 nerve pattern. We discussed treatment options in detail today, would recommend diagnostic and hopefully therapeutic bilateral S1 transforaminal epidural steroid injections under fluoroscopic guidance. I discussed injection procedure with her in detail today, she has no questions at this time. Patient does living in [facility], which is closer to [place] and would prefer to move her care closer to home. I did place referral to Dr. Wallie Sherry with [facility] at [facility]. We would like to Dr. Marven to evaluate her in the office and proceed with injections if he feels appropriate. Patient verbalizes understanding of plan.     Meds & Orders: No orders of the defined types were placed in this encounter.  No orders of the defined types were placed in this encounter.   Follow-up: No follow-ups on file.   Procedures: No procedures performed      Clinical History: Narrative & Impression CLINICAL DATA:  Pain of left sacroiliac  joint. Additional history provided by scanning technologist: Patient reports low back pain with left leg pain for 6 months. Pain radiates to left hip. Previous fusion in 1994.   EXAM: MRI LUMBAR SPINE WITHOUT CONTRAST   TECHNIQUE: Multiplanar,  multisequence MR imaging of the lumbar spine was performed. No intravenous contrast was administered.   COMPARISON:  No pertinent prior exams available for comparison.   FINDINGS: Despite the use of metal artifact reduction sequences, there is severe susceptibility artifact arising from a posterior spinal fusion construct extending from the thoracic spine to the level of the sacrum. This significantly obscures the vertebrae, spinal canal and neural foramina, and significantly limits evaluation. It also significantly limits evaluation for marrow edema/osseous lesions.   Segmentation: For the purposes of this dictation, five lumbar vertebrae are assumed and the caudal most well-formed intervertebral disc is designated L5-S1.   Alignment: Lumbar levocurvature. Mild L1-L2 grade 1 retrolisthesis.   Vertebrae: No vertebral body height loss, focal suspicious marrow lesion or significant marrow edema is identified (although susceptibility artifact significantly limits evaluation).   Conus medullaris and cauda equina: Susceptibility artifact largely obscures the spinal canal, precluding identification of the termination of the conus medullaris. It also largely obscures the cauda equina.   Paraspinal and other soft tissues: No abnormality identified within included portions of the abdomen/retroperitoneum. Postsurgical changes to the dorsal thoracic, lumbar and sacral paraspinal soft tissues.   Disc levels:   Multilevel disc degeneration, incompletely assessed.   At L1-L2, there is advanced disc degeneration. Mild disc bulging and endplate spurring. The spinal canal is fairly well visualized. No apparent spinal canal stenosis at this level. The neural foramina are obscured by susceptibility artifact.   At L4-L5, there is moderate/advanced disc degeneration with possible fusion across the disc space. Small disc bulge and mild endplate spurring. The spinal canal is well visualized. No  significant spinal canal stenosis. No appreciable foraminal stenosis.   At L5-S1, there is fusion across the disc space. Endplate spurring. The spinal canal is fairly well visualized. No appreciable spinal canal stenosis at this level. No significant neural foraminal narrowing.   Susceptibility artifact precludes adequate evaluation at the remaining levels.   IMPRESSION: Despite the use of metal artifact reduction sequences, there is severe susceptibility artifact arising from a posterior spinal fusion construct extending from the thoracic spine to the level of the sacrum. This significantly obscures the vertebrae, spinal canal and neural foramina, and significantly limits evaluation. It also significantly limits evaluation for marrow edema/osseous lesions.   At L1-L2, there is advanced disc degeneration on the right. Mild disc bulging and endplate spurring. The spinal canal is fairly well visualized. No apparent spinal canal stenosis at this level. The neural foramina are obscured by susceptibility artifact.   At L4-L5, there is moderate/advanced disc degeneration with possible fusion across the disc space. Small disc bulge and mild endplate spurring. The spinal canal is well visualized. No significant spinal canal stenosis. The neural foramina are partially obscured by susceptibility artifact. No appreciable foraminal stenosis.   At L5-S1, there is fusion across the disc space. Endplate spurring. The spinal canal is fairly well visualized. No appreciable spinal canal stenosis at this level. No significant neural foraminal narrowing.   Susceptibility artifact precludes adequate evaluation at the remaining levels.   Lumbar levocurvature.     Electronically Signed   By: Rockey Childs DO   On: 09/30/2020 12:32   She reports that she has never smoked. She has never used smokeless tobacco.  Recent Labs  01/05/23 1612 04/08/23 1135 09/30/23 1153  HGBA1C 6.6* 6.1* 6.3     Objective:  VS:  HT:    WT:   BMI:     BP:   HR: bpm  TEMP: ( )  RESP:  Physical Exam Vitals and nursing note reviewed.  HENT:     Head: Normocephalic and atraumatic.     Right Ear: External ear normal.     Left Ear: External ear normal.     Nose: Nose normal.     Mouth/Throat:     Mouth: Mucous membranes are moist.   Eyes:     Extraocular Movements: Extraocular movements intact.    Cardiovascular:     Rate and Rhythm: Normal rate.     Pulses: Normal pulses.  Pulmonary:     Effort: Pulmonary effort is normal.  Abdominal:     General: Abdomen is flat. There is no distension.   Musculoskeletal:        General: Tenderness present.     Cervical back: Normal range of motion.     Comments: Patient is slow to rise from seated position to standing. Good lumbar range of motion. No pain noted with facet loading. 5/5 strength noted with left hip flexion, knee flexion/extension, ankle dorsiflexion/plantarflexion and EHL. 4/5 strength with right ankle dorsiflexion. 5/5 strength noted with right hip flexion, knee flexion/extension, ankle plantarflexion and EHL No clonus noted bilaterally. No pain upon palpation of greater trochanters. No pain with internal/external rotation of bilateral hips. Sensation intact bilaterally. Negative slump test bilaterally. Ambulates with cane, gait slow and unsteady.      Skin:    General: Skin is warm and dry.     Capillary Refill: Capillary refill takes less than 2 seconds.   Neurological:     Mental Status: She is alert and oriented to person, place, and time.   Psychiatric:        Mood and Affect: Mood normal.        Behavior: Behavior normal.     Ortho Exam  Imaging: No results found.  Past Medical/Family/Surgical/Social History: Medications & Allergies reviewed per EMR, new medications updated. Patient Active Problem List   Diagnosis Date Noted   Acute midline low back pain with left-sided sciatica 10/27/2023   Bilateral buttock  pain 04/08/2023   Degeneration of intervertebral disc of lumbar region with lower extremity pain 04/08/2023   History of spinal fusion 04/08/2023   Type 2 diabetes mellitus with stage 3a chronic kidney disease, without long-term current use of insulin (HCC) 03/18/2022   Melanoma of skin (HCC) 03/16/2022   Varicose veins of leg with swelling, left 12/23/2021   Benign paroxysmal positional vertigo 07/02/2021   Peripheral neuropathy 03/12/2021   Hyperlipidemia associated with type 2 diabetes mellitus (HCC) 09/18/2019   Chronic kidney disease, stage 3a (HCC) 09/18/2019   Obstructive sleep apnea 07/05/2019   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/2019   Osteopenia 02/08/2019   Open-angle glaucoma of both eyes, mild stage 02/07/2019   Essential tremor 05/20/2016   MDD (major depressive disorder), recurrent episode, moderate (HCC) 12/25/2015   BCC (basal cell carcinoma of skin) 07/29/2015   Reflex sympathetic dystrophy of lower extremity 06/05/2014   MITRAL VALVE PROLAPSE 10/10/2007   Venous (peripheral) insufficiency 10/10/2007   IRRITABLE BOWEL SYNDROME 10/10/2007   SCOLIOSIS 10/10/2007   PAC (premature atrial contraction) 10/10/2007   Ocular hypertension 06/15/1998   Past Medical History:  Diagnosis Date   Cataract    left eye surgery to remove, right eye just watching currently  Constipation    Depression    Diabetes mellitus without complication (HCC)    Diverticulosis of colon    Glaucoma    ocular htn bilateral eyes - uses drops    History of blood transfusion 1994   with spinal fusion surgery   IBS (irritable bowel syndrome)    Migraine headache    resolved, no current problem   Mitral valve prolapse    patient denies this dx   Neuromuscular disorder (HCC)    essential tremor hands   Osteopenia    Osteoporosis    PAC (premature atrial contraction)    on metoprolol    Palpitations    Scoliosis    Shoulder pain    resolved, no loonger a problem   Sleep apnea    Toxic  effect of venom(989.5)    Venous insufficiency    Family History  Problem Relation Age of Onset   Diabetes Mother    Cancer Father        melanoma   Stroke Paternal Grandmother 31   Colon cancer Neg Hx    Rectal cancer Neg Hx    Stomach cancer Neg Hx    Esophageal cancer Neg Hx    Colon polyps Neg Hx    Past Surgical History:  Procedure Laterality Date   BACK SURGERY  1994   scoliosis at [facility]   CATARACT EXTRACTION Left 04/2010   COLONOSCOPY  2009   [facility]- Normal   ROTATOR CUFF REPAIR  2006   WISDOM TOOTH EXTRACTION     Social History   Occupational History   Not on file  Tobacco Use   Smoking status: Never   Smokeless tobacco: Never  Vaping Use   Vaping status: Never Used  Substance and Sexual Activity   Alcohol use: Never    Comment: rarely - holiday   Drug use: No   Sexual activity: Yes    Partners: Male    Birth control/protection: Post-menopausal    Comment: husband vasectomy

## 2023-12-21 DIAGNOSIS — R293 Abnormal posture: Secondary | ICD-10-CM | POA: Diagnosis not present

## 2023-12-21 DIAGNOSIS — G8929 Other chronic pain: Secondary | ICD-10-CM | POA: Diagnosis not present

## 2023-12-21 DIAGNOSIS — R2689 Other abnormalities of gait and mobility: Secondary | ICD-10-CM | POA: Diagnosis not present

## 2023-12-21 DIAGNOSIS — M545 Low back pain, unspecified: Secondary | ICD-10-CM | POA: Diagnosis not present

## 2023-12-28 DIAGNOSIS — R2689 Other abnormalities of gait and mobility: Secondary | ICD-10-CM | POA: Diagnosis not present

## 2023-12-28 DIAGNOSIS — R293 Abnormal posture: Secondary | ICD-10-CM | POA: Diagnosis not present

## 2023-12-28 DIAGNOSIS — M545 Low back pain, unspecified: Secondary | ICD-10-CM | POA: Diagnosis not present

## 2023-12-28 DIAGNOSIS — G8929 Other chronic pain: Secondary | ICD-10-CM | POA: Diagnosis not present

## 2023-12-30 DIAGNOSIS — G8929 Other chronic pain: Secondary | ICD-10-CM | POA: Diagnosis not present

## 2023-12-30 DIAGNOSIS — R2689 Other abnormalities of gait and mobility: Secondary | ICD-10-CM | POA: Diagnosis not present

## 2023-12-30 DIAGNOSIS — R293 Abnormal posture: Secondary | ICD-10-CM | POA: Diagnosis not present

## 2023-12-30 DIAGNOSIS — M545 Low back pain, unspecified: Secondary | ICD-10-CM | POA: Diagnosis not present

## 2024-01-04 DIAGNOSIS — G8929 Other chronic pain: Secondary | ICD-10-CM | POA: Diagnosis not present

## 2024-01-04 DIAGNOSIS — R293 Abnormal posture: Secondary | ICD-10-CM | POA: Diagnosis not present

## 2024-01-04 DIAGNOSIS — M545 Low back pain, unspecified: Secondary | ICD-10-CM | POA: Diagnosis not present

## 2024-01-04 DIAGNOSIS — R2689 Other abnormalities of gait and mobility: Secondary | ICD-10-CM | POA: Diagnosis not present

## 2024-01-06 ENCOUNTER — Encounter: Payer: Self-pay | Admitting: Student in an Organized Health Care Education/Training Program

## 2024-01-06 ENCOUNTER — Ambulatory Visit: Admitting: Student in an Organized Health Care Education/Training Program

## 2024-01-06 ENCOUNTER — Other Ambulatory Visit: Payer: Self-pay | Admitting: Student in an Organized Health Care Education/Training Program

## 2024-01-06 ENCOUNTER — Ambulatory Visit
Admission: RE | Admit: 2024-01-06 | Discharge: 2024-01-06 | Disposition: A | Discharge status: Home / Self Care | Source: Ambulatory Visit | Attending: Student in an Organized Health Care Education/Training Program | Admitting: Student in an Organized Health Care Education/Training Program

## 2024-01-06 VITALS — BP 128/89 | HR 77 | Temp 97.3°F | Resp 16 | Ht 64.0 in | Wt 134.0 lb

## 2024-01-06 DIAGNOSIS — M533 Sacrococcygeal disorders, not elsewhere classified: Secondary | ICD-10-CM | POA: Insufficient documentation

## 2024-01-06 DIAGNOSIS — M48061 Spinal stenosis, lumbar region without neurogenic claudication: Secondary | ICD-10-CM

## 2024-01-06 DIAGNOSIS — M461 Sacroiliitis, not elsewhere classified: Secondary | ICD-10-CM | POA: Insufficient documentation

## 2024-01-06 DIAGNOSIS — Z981 Arthrodesis status: Secondary | ICD-10-CM | POA: Insufficient documentation

## 2024-01-06 DIAGNOSIS — G894 Chronic pain syndrome: Secondary | ICD-10-CM | POA: Insufficient documentation

## 2024-01-06 DIAGNOSIS — G588 Other specified mononeuropathies: Secondary | ICD-10-CM | POA: Diagnosis not present

## 2024-01-06 DIAGNOSIS — M51369 Other intervertebral disc degeneration, lumbar region without mention of lumbar back pain or lower extremity pain: Secondary | ICD-10-CM

## 2024-01-06 DIAGNOSIS — R52 Pain, unspecified: Secondary | ICD-10-CM

## 2024-01-06 MED ORDER — BUPRENORPHINE 5 MCG/HR TD PTWK: 1.0000 | MEDICATED_PATCH | TRANSDERMAL | 0 refills | Status: AC

## 2024-01-06 MED ORDER — BUPRENORPHINE 7.5 MCG/HR TD PTWK: 1.0000 | MEDICATED_PATCH | TRANSDERMAL | 0 refills | Status: DC

## 2024-01-06 NOTE — Progress Notes (Signed)
 PROVIDER NOTE: Interpretation of information contained herein should be left to medically-trained personnel. Specific patient instructions are provided elsewhere under Patient Instructions section of medical record. This document was created in part using AI and STT-dictation technology, any transcriptional errors that may result from this process are unintentional.  Patient: Dana Massey  Service: E/M Encounter  Provider: Wallie Sherry, MD  DOB: 1945/12/09  Delivery: Face-to-face  Specialty: Interventional Pain Management  MRN: [id number]  Setting: Ambulatory outpatient facility  Specialty designation: 09  Type: New Patient  Location: Outpatient office facility  PCP: Avelina Greig BRAVO, MD  DOS: 01/06/2024    Referring Prov.: Trudy Duwaine BRAVO, NP   Primary Reason(s) for Visit: Encounter for initial evaluation of one or more chronic problems (new to examiner) potentially causing chronic pain, and posing a threat to normal musculoskeletal function. (Level of risk: High) CC: Back Pain (Lumbar biateral left is worse )  HPI  Dana Massey is a 78 y.o. year old, female patient, who comes for the first time to our practice referred by Williams, Megan E, NP for our initial evaluation of her chronic pain. She has MITRAL VALVE PROLAPSE; Venous (peripheral) insufficiency; IRRITABLE BOWEL SYNDROME; SCOLIOSIS; PAC (premature atrial contraction); Reflex sympathetic dystrophy of lower extremity; BCC (basal cell carcinoma of skin); MDD (major depressive disorder), recurrent episode, moderate (HCC); Essential tremor; Open-angle glaucoma of both eyes, mild stage; Osteopenia; Obstructive sleep apnea; Secondary erythrocytosis; Hyperlipidemia associated with type 2 diabetes mellitus (HCC); Chronic kidney disease, stage 3a (HCC); Cluneal neuropathy; Benign paroxysmal positional vertigo; Ocular hypertension; Varicose veins of leg with swelling, left; Melanoma of skin (HCC); Type 2 diabetes mellitus with stage 3a chronic kidney  disease, without long-term current use of insulin (HCC); Bilateral buttock pain; Degeneration of intervertebral disc of lumbar region with lower extremity pain; History of lumbar spinal fusion; Acute midline low back pain with left-sided sciatica; 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 Today she comes in for evaluation of her Back Pain (Lumbar biateral left is worse )  Pain Assessment: Location: Lower, Right, Left Back Radiating: down both legs, left being the worse to approx the knee Onset: More than a month ago Duration: Chronic pain Quality: Aching, Pressure, Tingling, Nagging, Throbbing, Discomfort Severity: 5 /10 (subjective, self-reported pain score)  Effect on ADL: usually a sedentary person, when sitting doing activities she can feel the pain starting to build.  nights are best when lying flat Timing: Intermittent Modifying factors: lying down completely relieves the pain BP: 128/89  HR: 77  Onset and Duration: Gradual and Present longer than 3 months Cause of pain: Unknown Severity: Getting worse, NAS-11 at its worse: 8/10, NAS-11 at its best: 2/10, NAS-11 now: 3/10, and NAS-11 on the average: 3/10 Timing: Afternoon, Evening, During activity or exercise, and After a period of immobility Aggravating Factors: Bending, Climbing, Prolonged sitting, Prolonged standing, Walking, and Walking uphill Alleviating Factors: Stretching, Cold packs, Lying down, and Sleeping Associated Problems: Constipation, Depression, Fatigue, Numbness, Tingling, and Weakness Quality of Pain: Aching, Deep, Feeling of weight, Nagging, Tingling, Toothache-like, and Uncomfortable Previous Examinations or Tests: EMG/PNCV, MRI scan, X-rays, Nerve conduction test, and Neurological evaluation Previous Treatments: Epidural steroid injections, Physical Therapy, and Stretching  exercises  Dana Massey is being evaluated for possible interventional pain management therapies for the treatment of her chronic pain.   Discussed the use of AI scribe software for clinical note transcription with the  patient, who gave verbal consent to proceed.  History of Present Illness   Dana Massey is a 78 year old female with scoliosis and significant spinal fusion from T11 to sacrum who presents with worsening back pain and sciatica. She was referred by her primary care doctor for evaluation of her chronic back pain and sciatica.  She has a history of scoliosis diagnosed in her teenage years, which progressively worsened, leading to a spinal fusion surgery in 1994 with the placement of twelve screws from T11 to the sacrum. Over time, her back pain has worsened.  She experiences chronic back pain, primarily presenting as low buttock pain, with some radiation. She manages her pain with gabapentin 400 mg twice daily and meloxicam , which was started about five to six months ago. Gabapentin does not seem to be effective in alleviating her pain.  She also experiences pain overlying her iliac crest as well as in her buttock over her SI joint and piriformis.  She was diagnosed with peripheral neuropathy by a neurologist, characterized by weakness in her leg and burning or tingling sensations in her feet. She is also a type two diabetic but is not on insulin therapy.  In the past, she received two injections at [facility] by [place], although she is unsure if these were epidural injections. The focus of these injections was on the sacroiliac joints.  Her family history includes her mother having spine problems and being on hydrocodone for pain management towards the end of her life.       PMP checked and reviewed Meds   Current Outpatient Medications:    Acetaminophen 500 MG coapsule, Take 50 mg by mouth every 6 (six) hours as needed., Disp: , Rfl:    aspirin EC 81 MG tablet,  Take 81 mg by mouth daily., Disp: , Rfl:    atorvastatin  (LIPITOR) 10 MG tablet, TAKE 1 TABLET EVERY DAY, Disp: 90 tablet, Rfl: 3   buprenorphine  (BUTRANS ) 5 MCG/HR PTWK, Place 1 patch onto the skin once a week for 28 days., Disp: 4 patch, Rfl: 0   [START ON 02/03/2024] buprenorphine  (BUTRANS ) 7.5 MCG/HR, Place 1 patch onto the skin once a week for 28 days., Disp: 4 patch, Rfl: 0   Calcium -Vitamin D -Vitamin K 500-100-40 MG-UNT-MCG CHEW, Chew 1 capsule by mouth daily.  , Disp: , Rfl:    dorzolamide-timolol (COSOPT) 22.3-6.8 MG/ML ophthalmic solution, Place 1 drop into both eyes in the morning and at bedtime., Disp: , Rfl:    DULoxetine  (CYMBALTA ) 60 MG capsule, Take 1 capsule (60 mg total) by mouth daily. Take 1 capsule (30 mg total) by mouth daily., Disp: 90 capsule, Rfl: 1   fish oil-omega-3 fatty acids 1000 MG capsule, Take 2 g by mouth daily.  , Disp: , Rfl:    gabapentin (NEURONTIN) 400 MG capsule, Take 400 mg by mouth 2 (two) times daily., Disp: , Rfl:    latanoprost (XALATAN) 0.005 % ophthalmic solution, Place 1 drop into both eyes at bedtime., Disp: , Rfl:    Meclizine  HCl 25 MG CHEW, Chew 1-2 tablets by mouth daily as needed (Veritgo)., Disp: , Rfl:    meloxicam  (MOBIC ) 15 MG tablet, TAKE 1 TABLET EVERY DAY, Disp: 90 tablet, Rfl: 3   metoprolol  succinate (TOPROL -XL) 25 MG 24 hr tablet, TAKE 1 TABLET DAILY WITH OR IMMEDIATELY FOLLOWING A MEAL. PLEASE SCHEDULE OFFICE VISIT FOR FUTURE REFILLS, Disp: 90 tablet, Rfl: 3   Multiple Vitamins-Minerals (MULTIVITAMIN WITH MINERALS) tablet, Take 1 tablet by mouth  daily.  , Disp: , Rfl:    polyethylene glycol powder (GLYCOLAX/MIRALAX) 17 GM/SCOOP powder, Take 17 g by mouth daily as needed. , Disp: , Rfl:   Imaging Review   Narrative CLINICAL DATA:  Fall  EXAM: CT HEAD WITHOUT CONTRAST  CT CERVICAL SPINE WITHOUT CONTRAST  TECHNIQUE: Multidetector CT imaging of the head and cervical spine was performed following the standard protocol without  intravenous contrast. Multiplanar CT image reconstructions of the cervical spine were also generated.  COMPARISON:  None.  FINDINGS: CT HEAD FINDINGS  Brain:  No evidence of large-territorial acute infarction. No parenchymal hemorrhage. No mass lesion. No extra-axial collection.  No mass effect or midline shift. No hydrocephalus. Basilar cisterns are patent.  Vascular: No hyperdense vessel. Atherosclerotic calcifications are present within the cavernous internal carotid arteries.  Skull: No acute fracture or focal lesion.  Sinuses/Orbits: Paranasal sinuses and mastoid air cells are clear. The orbits are unremarkable.  Other: Right scalp trace hematoma formation.  CT CERVICAL SPINE FINDINGS  Alignment: Grade 1 anterolisthesis of C4 on C5, C5 on C6.  Skull base and vertebrae: No acute fracture. No aggressive appearing focal osseous lesion or focal pathologic process.  Soft tissues and spinal canal: No prevertebral fluid or swelling. No visible canal hematoma.  Upper chest: Trace biapical pleural/pulmonary scarring.  Other: None.  IMPRESSION: 1. No acute intracranial abnormality. 2. No acute displaced fracture or traumatic listhesis of the cervical spine.   Electronically Signed By: Morgane  Naveau M.D. On: 05/10/2021 16:18  MR LUMBAR SPINE WO CONTRAST  Narrative CLINICAL DATA:  Pain of left sacroiliac joint. Additional history provided by scanning technologist: Patient reports low back pain with left leg pain for 6 months. Pain radiates to left hip. Previous fusion in 1994.  EXAM: MRI LUMBAR SPINE WITHOUT CONTRAST  TECHNIQUE: Multiplanar, multisequence MR imaging of the lumbar spine was performed. No intravenous contrast was administered.  COMPARISON:  No pertinent prior exams available for comparison.  FINDINGS: Despite the use of metal artifact reduction sequences, there is severe susceptibility artifact arising from a posterior spinal fusion  construct extending from the thoracic spine to the level of the sacrum. This significantly obscures the vertebrae, spinal canal and neural foramina, and significantly limits evaluation. It also significantly limits evaluation for marrow edema/osseous lesions.  Segmentation: For the purposes of this dictation, five lumbar vertebrae are assumed and the caudal most well-formed intervertebral disc is designated L5-S1.  Alignment: Lumbar levocurvature. Mild L1-L2 grade 1 retrolisthesis.  Vertebrae: No vertebral body height loss, focal suspicious marrow lesion or significant marrow edema is identified (although susceptibility artifact significantly limits evaluation).  Conus medullaris and cauda equina: Susceptibility artifact largely obscures the spinal canal, precluding identification of the termination of the conus medullaris. It also largely obscures the cauda equina.  Paraspinal and other soft tissues: No abnormality identified within included portions of the abdomen/retroperitoneum. Postsurgical changes to the dorsal thoracic, lumbar and sacral paraspinal soft tissues.  Disc levels:  Multilevel disc degeneration, incompletely assessed.  At L1-L2, there is advanced disc degeneration. Mild disc bulging and endplate spurring. The spinal canal is fairly well visualized. No apparent spinal canal stenosis at this level. The neural foramina are obscured by susceptibility artifact.  At L4-L5, there is moderate/advanced disc degeneration with possible fusion across the disc space. Small disc bulge and mild endplate spurring. The spinal canal is well visualized. No significant spinal canal stenosis. No appreciable foraminal stenosis.  At L5-S1, there is fusion across the disc space. Endplate spurring. The spinal  canal is fairly well visualized. No appreciable spinal canal stenosis at this level. No significant neural foraminal narrowing.  Susceptibility artifact precludes adequate  evaluation at the remaining levels.  IMPRESSION: Despite the use of metal artifact reduction sequences, there is severe susceptibility artifact arising from a posterior spinal fusion construct extending from the thoracic spine to the level of the sacrum. This significantly obscures the vertebrae, spinal canal and neural foramina, and significantly limits evaluation. It also significantly limits evaluation for marrow edema/osseous lesions.  At L1-L2, there is advanced disc degeneration on the right. Mild disc bulging and endplate spurring. The spinal canal is fairly well visualized. No apparent spinal canal stenosis at this level. The neural foramina are obscured by susceptibility artifact.  At L4-L5, there is moderate/advanced disc degeneration with possible fusion across the disc space. Small disc bulge and mild endplate spurring. The spinal canal is well visualized. No significant spinal canal stenosis. The neural foramina are partially obscured by susceptibility artifact. No appreciable foraminal stenosis.  At L5-S1, there is fusion across the disc space. Endplate spurring. The spinal canal is fairly well visualized. No appreciable spinal canal stenosis at this level. No significant neural foraminal narrowing.  Susceptibility artifact precludes adequate evaluation at the remaining levels.  Lumbar levocurvature.   Electronically Signed By: Rockey Childs DO On: 09/30/2020 12:32  DG Foot Complete Right  Narrative CLINICAL DATA:  RIGHT foot pain. Pain across the ball of foot for 1 week.  EXAM: RIGHT FOOT COMPLETE - 3+ VIEW  COMPARISON:  None.  FINDINGS: Anatomic alignment. No fracture. Small likely dystrophic calcification is present adjacent to the lateral great toe sesamoid bone. No sesamoid bone fracture is identified. MTP joints appear within normal limits. No erosive changes.  IMPRESSION: Negative.   Electronically Signed By: Isla Mays M.D. On:  05/15/2014 16:27   Complexity Note: Imaging results reviewed.                         ROS  Cardiovascular: Abnormal heart rhythm and Daily Aspirin intake Pulmonary or Respiratory: Temporary stoppage of breathing during sleep Neurological: Abnormal skin sensations (Peripheral Neuropathy) and Curved spine Psychological-Psychiatric: Depressed Gastrointestinal: Alternating episodes iof diarrhea and constipation (IBS-Irritable bowe syndrome) and Irregular, infrequent bowel movements (Constipation) Genitourinary: No reported renal or genitourinary signs or symptoms such as difficulty voiding or producing urine, peeing blood, non-functioning kidney, kidney stones, difficulty emptying the bladder, difficulty controlling the flow of urine, or chronic kidney disease Hematological: Brusing easily Endocrine: High blood sugar controlled without the use of insulin (NIDDM) Rheumatologic: No reported rheumatological signs and symptoms such as fatigue, joint pain, tenderness, swelling, redness, heat, stiffness, decreased range of motion, with or without associated rash Musculoskeletal: Negative for myasthenia gravis, muscular dystrophy, multiple sclerosis or malignant hyperthermia Work History: Retired  Allergies  Dana Massey is allergic to wasp venom.  Laboratory Chemistry Profile   Renal Lab Results  Component Value Date   BUN 14 09/30/2023   CREATININE 0.80 09/30/2023   GFR 71.26 09/30/2023   GFRAA 54 (L) 07/05/2019   GFRNONAA 47 (L) 07/05/2019     Electrolytes Lab Results  Component Value Date   NA 137 09/30/2023   K 4.4 09/30/2023   CL 97 09/30/2023   CALCIUM  9.3 09/30/2023     Hepatic Lab Results  Component Value Date   AST 28 09/30/2023   ALT 21 09/30/2023   ALBUMIN 4.5 09/30/2023   ALKPHOS 55 09/30/2023     ID Lab Results  Component Value Date   HCVAB NEGATIVE 12/25/2015     Bone Lab Results  Component Value Date   VD25OH 63.48 02/08/2019     Endocrine Lab  Results  Component Value Date   GLUCOSE 92 09/30/2023   GLUCOSEU NEGATIVE 03/10/2019   HGBA1C 6.3 09/30/2023   TSH 2.21 02/08/2019     Neuropathy Lab Results  Component Value Date   VITAMINB12 687 03/13/2019   FOLATE 91.3 03/13/2019   HGBA1C 6.3 09/30/2023     CNS No results found for: COLORCSF, APPEARCSF, RBCCOUNTCSF, WBCCSF, POLYSCSF, LYMPHSCSF, EOSCSF, PROTEINCSF, GLUCCSF, JCVIRUS, CSFOLI, IGGCSF, LABACHR, ACETBL   Inflammation (CRP: Acute  ESR: Chronic) No results found for: CRP, ESRSEDRATE, LATICACIDVEN   Rheumatology No results found for: RF, ANA, LABURIC, URICUR, LYMEIGGIGMAB, LYMEABIGMQN, HLAB27   Coagulation Lab Results  Component Value Date   PLT 187.0 03/16/2022     Cardiovascular Lab Results  Component Value Date   TROPONINI < 0.02 07/15/2012   HGB 15.8 (H) 03/16/2022   HCT 47.6 (H) 03/16/2022     Screening Lab Results  Component Value Date   HCVAB NEGATIVE 12/25/2015     Cancer No results found for: CEA, CA125, LABCA2   Allergens No results found for: ALMOND, APPLE, ASPARAGUS, AVOCADO, BANANA, BARLEY, BASIL, BAYLEAF, GREENBEAN, LIMABEAN, WHITEBEAN, BEEFIGE, REDBEET, BLUEBERRY, BROCCOLI, CABBAGE, MELON, CARROT, CASEIN, CASHEWNUT, CAULIFLOWER, CELERY     Note: Lab results reviewed.  PFSH  Drug: Dana Massey  reports no history of drug use. Alcohol:  reports no history of alcohol use. Tobacco:  reports that she has never smoked. She has never used smokeless tobacco. Medical:  has a past medical history of Cataract, Constipation, Depression, Diabetes mellitus without complication (HCC), Diverticulosis of colon, Glaucoma, History of blood transfusion (1994), IBS (irritable bowel syndrome), Migraine headache, Mitral valve prolapse, Neuromuscular disorder (HCC), Osteopenia, Osteoporosis, PAC (premature atrial contraction), Palpitations, Scoliosis, Shoulder  pain, Sleep apnea, Toxic effect of venom(989.5), and Venous insufficiency. Family: family history includes Cancer in her father; Diabetes in her mother; Stroke (age of onset: 8) in her paternal grandmother.  Past Surgical History:  Procedure Laterality Date   BACK SURGERY  1994   scoliosis at [facility] spine center   CATARACT EXTRACTION Left 04/2010   COLONOSCOPY  2009   [place]- Normal   ROTATOR CUFF REPAIR  2006   WISDOM TOOTH EXTRACTION     Active Ambulatory Problems    Diagnosis Date Noted   MITRAL VALVE PROLAPSE 10/10/2007   Venous (peripheral) insufficiency 10/10/2007   IRRITABLE BOWEL SYNDROME 10/10/2007   SCOLIOSIS 10/10/2007   PAC (premature atrial contraction) 10/10/2007   Reflex sympathetic dystrophy of lower extremity 06/05/2014   BCC (basal cell carcinoma of skin) 07/29/2015   MDD (major depressive disorder), recurrent episode, moderate (HCC) 12/25/2015   Essential tremor 05/20/2016   Open-angle glaucoma of both eyes, mild stage 02/07/2019   Osteopenia 02/08/2019   Obstructive sleep apnea 07/05/2019   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/2019   Hyperlipidemia associated with type 2 diabetes mellitus (HCC) 09/18/2019   Chronic kidney disease, stage 3a (HCC) 09/18/2019   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   Benign paroxysmal positional vertigo 07/02/2021   Ocular hypertension 06/15/1998   Varicose veins of leg with swelling, left 12/23/2021   Melanoma of skin (HCC) 03/16/2022   Type 2 diabetes mellitus with stage 3a chronic kidney disease, without long-term current use of insulin (HCC) 03/18/2022   Bilateral buttock pain 04/08/2023   Degeneration of intervertebral disc of lumbar region with lower extremity pain 04/08/2023  History of lumbar spinal fusion 04/08/2023   Acute midline low back pain with left-sided sciatica 10/27/2023   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 01/06/2024   Chronic pain syndrome 01/06/2024   Sacroiliac joint pain  01/06/2024   SI joint arthritis (HCC) 01/06/2024   Resolved Ambulatory Problems    Diagnosis Date Noted   Migraine headache 10/10/2007   GLAUCOMA 10/10/2007   Diverticulosis of large intestine 10/10/2007   SHOULDER PAIN 10/10/2007   Disorder of bone and cartilage 10/10/2007   Toxic effect of venom(989.5) 10/10/2007   Medicare annual wellness visit, initial 12/18/2013   Right foot pain 05/15/2014   Medicare annual wellness visit, subsequent 12/24/2014   Encounter for routine gynecological examination 12/24/2014   Well woman exam without gynecological exam 12/25/2015   Failed hearing screening 12/25/2015   Chronic left hip pain 03/05/2020   Upper respiratory tract infection 04/29/2021   Post concussion syndrome 05/15/2021   Fall 05/15/2021   Orthostatic dizziness 07/02/2021   Swelling of limb 10/31/2021   Prediabetes 03/16/2022   Past Medical History:  Diagnosis Date   Cataract    Constipation    Depression    Diabetes mellitus without complication (HCC)    Diverticulosis of colon    Glaucoma    History of blood transfusion 1994   IBS (irritable bowel syndrome)    Mitral valve prolapse    Neuromuscular disorder (HCC)    Osteoporosis    Palpitations    Scoliosis    Shoulder pain    Sleep apnea    Toxic effect of venom(989.5)    Venous insufficiency    Constitutional Exam  General appearance: Well nourished, well developed, and well hydrated. In no apparent acute distress Vitals:   01/06/24 1253  BP: 128/89  Pulse: 77  Resp: 16  Temp: (!) 97.3 F (36.3 C)  TempSrc: Temporal  SpO2: 96%  Weight: 134 lb (60.8 kg)  Height: 5' 4 (1.626 m)   BMI Assessment: Estimated body mass index is 23 kg/m as calculated from the following:   Height as of this encounter: 5' 4 (1.626 m).   Weight as of this encounter: 134 lb (60.8 kg).  BMI interpretation table: BMI level Category Range association with higher incidence of chronic pain  <18 kg/m2 Underweight   18.5-24.9  kg/m2 Ideal body weight   25-29.9 kg/m2 Overweight Increased incidence by 20%  30-34.9 kg/m2 Obese (Class I) Increased incidence by 68%  35-39.9 kg/m2 Severe obesity (Class II) Increased incidence by 136%  >40 kg/m2 Extreme obesity (Class III) Increased incidence by 254%   Patient's current BMI Ideal Body weight  Body mass index is 23 kg/m. Ideal body weight: 54.7 kg (120 lb 9.5 oz) Adjusted ideal body weight: 57.1 kg (125 lb 15.3 oz)   BMI Readings from Last 4 Encounters:  01/06/24 23.00 kg/m  10/26/23 24.24 kg/m  10/07/23 24.91 kg/m  05/21/23 24.44 kg/m   Wt Readings from Last 4 Encounters:  01/06/24 134 lb (60.8 kg)  10/26/23 141 lb 3.2 oz (64 kg)  10/07/23 145 lb 2 oz (65.8 kg)  05/21/23 142 lb 6 oz (64.6 kg)    Psych/Mental status: Alert, oriented x 3 (person, place, & time)       Eyes: PERLA Respiratory: No evidence of acute respiratory distress  Thoracic Spine Area Exam  Skin & Axial Inspection: Well healed scar from previous spine surgery detected Alignment: Asymmetric Functional ROM: Pain restricted ROM Stability: No instability detected Muscle Tone/Strength: Functionally  intact. No obvious neuro-muscular anomalies detected. Sensory (Neurological): Dermatomal pain pattern Muscle strength & Tone: No palpable anomalies Lumbar Spine Area Exam  Skin & Axial Inspection: Thoraco-lumbar Scoliosis, well-healed scar from surgery Alignment: Asymmetric Functional ROM: Pain restricted ROM       Stability: No instability detected Muscle Tone/Strength: Increased muscle tone over affected area Sensory (Neurological): Musculoskeletal pain pattern Palpation: Complains of area being tender to palpation        Gait & Posture Assessment  Ambulation: Patient ambulates using a cane Gait: Antalgic gait (limping) Posture: Difficulty standing up straight, due to pain  Lower Extremity Exam    Side: Right lower extremity  Side: Left lower extremity  Stability: No instability  observed          Stability: No instability observed          Skin & Extremity Inspection: Skin color, temperature, and hair growth are WNL. No peripheral edema or cyanosis. No masses, redness, swelling, asymmetry, or associated skin lesions. No contractures.  Skin & Extremity Inspection: Skin color, temperature, and hair growth are WNL. No peripheral edema or cyanosis. No masses, redness, swelling, asymmetry, or associated skin lesions. No contractures.  Functional ROM: Pain restricted ROM for all joints of the lower extremity          Functional ROM: Pain restricted ROM for hip and knee joints          Muscle Tone/Strength: Functionally intact. No obvious neuro-muscular anomalies detected.  Muscle Tone/Strength: Functionally intact. No obvious neuro-muscular anomalies detected.  Sensory (Neurological): Neurogenic pain pattern        Sensory (Neurological): Neurogenic pain pattern        DTR: Patellar: deferred today Achilles: deferred today Plantar: deferred today  DTR: Patellar: deferred today Achilles: deferred today Plantar: deferred today  Palpation: No palpable anomalies  Palpation: No palpable anomalies    Assessment  Primary Diagnosis & Pertinent Problem List: The primary encounter diagnosis was History of lumbar fusion. 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  Visit Diagnosis (New problems to examiner): 1. History of lumbar fusion   2. History of lumbar spinal fusion   3. 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 Chronic pain syndrome   5. Sacroiliac joint pain   6. SI joint arthritis (HCC)   7. Cluneal neuropathy    Plan of Care (Initial workup plan)  Assessment and Plan    Chronic pain due to spinal fusion   Chronic  pain persists in the lumbar region and buttocks following a spinal fusion from T11 to the sacrum in 1994. Gabapentin and meloxicam  provide limited relief. Opioid therapy is under consideration to improve quality of life and pain management. A buprenorphine  patch is discussed as a weak opioid alternative with a low risk of dependence, offering steady pain relief. Consider low-dose hydrocodone or another opioid for pain management. Initiate a buprenorphine  patch at 5 mcg/hour, increasing to 7.5 mcg/hour next month. Monitor for side effects such as mood changes, sedation, constipation, and nausea.  Sacroiliac joint dysfunction   Pain in the low buttock region may be related to sacroiliac joint dysfunction.   Cluneal neuropathy   Potential cluneal neuropathy may contribute to referred back pain into the buttocks. It is considered in the differential diagnosis, if positive response can discuss  ablation versus cluneal nerve stimulation  Peripheral neuropathy   Diagnosed with peripheral neuropathy, she reports burning and tingling in her feet. Gabapentin is prescribed, but its efficacy is uncertain. Continue gabapentin 400 mg twice daily and monitor symptoms and efficacy.  Consider Qutenza in future  Type 2 diabetes mellitus without complications   Type 2 diabetes mellitus is managed without insulin, and no complications are reported.   Chronic lumbosacral radicular pain: Patient not a candidate for interlaminar L5-S1 or transforaminal ESI.  Hardware and compression preclude percutaneous access to anatomical targets.  She could be a candidate for a caudal ESI which we will consider after her response to a cluneal nerve block      Lab Orders         Compliance Drug Analysis, Ur     Imaging Orders         DG PAIN CLINIC C-ARM 1-60 MIN NO REPORT      Procedure Orders         CLUNEAL NERVE BLOCK     Pharmacotherapy (current): Medications ordered:  Meds ordered this encounter  Medications    buprenorphine  (BUTRANS ) 5 MCG/HR PTWK    Sig: Place 1 patch onto the skin once a week for 28 days.    Dispense:  4 patch    Refill:  0    Chronic Pain: STOP Act (Not applicable) Fill 1 day early if closed on refill date. Avoid benzodiazepines within 8 hours of opioids   buprenorphine  (BUTRANS ) 7.5 MCG/HR    Sig: Place 1 patch onto the skin once a week for 28 days.    Dispense:  4 patch    Refill:  0    Chronic Pain: STOP Act (Not applicable) Fill 1 day early if closed on refill date. Avoid benzodiazepines within 8 hours of opioids   Medications administered during this visit: Machelle C. Bells had no medications administered during this visit.     Interventional management options: Dana Massey was informed that there is no guarantee that she would be a candidate for interventional therapies. The decision will be based on the results of diagnostic studies, as well as Dana Massey's risk profile.  Procedure(s) under consideration:  Cluneal NB Caudal Qutenza SIJ/Piriformis    Provider-requested follow-up: Return in about 18 days (around 01/24/2024) for B/L Cluneal NB , in clinic [facility].  Future Appointments  Date Time Provider Department Center  01/13/2024  1:00 PM [facility] ANNUAL WELLNESS VISIT 1 [facility]  04/11/2024 11:00 AM Bedsole, Greig BRAVO, MD [facility]   I discussed the assessment and treatment plan with the patient. The patient was provided an opportunity to ask questions and all were answered. The patient agreed with the plan and demonstrated an understanding of the instructions.  Patient advised to call back or seek an in-person evaluation if the symptoms or condition worsens.  Duration of encounter: .  Total time on encounter, as per AMA guidelines included both the face-to-face and non-face-to-face time personally spent by the physician and/or other qualified health care professional(s) on the day of the encounter (includes time in activities that require the  physician or other qualified health care professional and does not include time in activities normally performed by clinical staff). Physician's time may include the following activities when performed: Preparing to see the patient (e.g., pre-charting review of records, searching for previously ordered imaging, lab work, and nerve conduction tests) Review of prior analgesic pharmacotherapies. Reviewing PMP Interpreting ordered tests (e.g., lab work, imaging, nerve conduction tests)  Performing post-procedure evaluations, including interpretation of diagnostic procedures Obtaining and/or reviewing separately obtained history Performing a medically appropriate examination and/or evaluation Counseling and educating the patient/family/caregiver Ordering medications, tests, or procedures Referring and communicating with other health care professionals (when not separately reported) Documenting clinical information in the electronic or other health record Independently interpreting results (not separately reported) and communicating results to the patient/ family/caregiver Care coordination (not separately reported)  Note by: Wallie Sherry, MD (TTS and AI technology used. I apologize for any typographical errors that were not detected and corrected.) Date: 01/06/2024; Time: 2:21 PM

## 2024-01-06 NOTE — Progress Notes (Signed)
 Safety precautions to be maintained throughout the outpatient stay will include: orient to surroundings, keep bed in low position, maintain call bell within reach at all times, provide assistance with transfer out of bed and ambulation.

## 2024-01-06 NOTE — Patient Instructions (Signed)

## 2024-01-07 DIAGNOSIS — M545 Low back pain, unspecified: Secondary | ICD-10-CM | POA: Diagnosis not present

## 2024-01-07 DIAGNOSIS — G8929 Other chronic pain: Secondary | ICD-10-CM | POA: Diagnosis not present

## 2024-01-07 DIAGNOSIS — R2689 Other abnormalities of gait and mobility: Secondary | ICD-10-CM | POA: Diagnosis not present

## 2024-01-07 DIAGNOSIS — R293 Abnormal posture: Secondary | ICD-10-CM | POA: Diagnosis not present

## 2024-01-10 DIAGNOSIS — G8929 Other chronic pain: Secondary | ICD-10-CM | POA: Diagnosis not present

## 2024-01-10 DIAGNOSIS — R2689 Other abnormalities of gait and mobility: Secondary | ICD-10-CM | POA: Diagnosis not present

## 2024-01-10 DIAGNOSIS — R293 Abnormal posture: Secondary | ICD-10-CM | POA: Diagnosis not present

## 2024-01-10 DIAGNOSIS — M545 Low back pain, unspecified: Secondary | ICD-10-CM | POA: Diagnosis not present

## 2024-01-10 LAB — COMPLIANCE DRUG ANALYSIS, UR

## 2024-01-13 ENCOUNTER — Ambulatory Visit: Payer: Medicare HMO

## 2024-01-13 VITALS — Ht 64.0 in | Wt 134.0 lb

## 2024-01-13 DIAGNOSIS — Z0000 Encounter for general adult medical examination without abnormal findings: Secondary | ICD-10-CM | POA: Diagnosis not present

## 2024-01-13 NOTE — Progress Notes (Signed)
 Subjective:   Dana Massey is a 78 y.o. who presents for a Medicare Wellness preventive visit.  As a reminder, Annual Wellness Visits don't include a physical exam, and some assessments may be limited, especially if this visit is performed virtually. We may recommend an in-person follow-up visit with your provider if needed.  Visit Complete: Virtual I connected with  Dana Massey on 01/13/24 by a audio enabled telemedicine application and verified that I am speaking with the correct person using two identifiers.  Patient Location: Home  Provider Location: Office/Clinic  I discussed the limitations of evaluation and management by telemedicine. The patient expressed understanding and agreed to proceed.  Vital Signs: Because this visit was a virtual/telehealth visit, some criteria may be missing or patient reported. Any vitals not documented were not able to be obtained and vitals that have been documented are patient reported.  VideoDeclined- This patient declined Librarian, academic. Therefore the visit was completed with audio only.  Persons Participating in Visit: Patient.  AWV Questionnaire: Yes: Patient Medicare AWV questionnaire was completed by the patient on 01/09/24; I have confirmed that all information answered by patient is correct and no changes since this date.  Cardiac Risk Factors include: advanced age (>71men, >75 women);diabetes mellitus;dyslipidemia;sedentary lifestyle     Objective:    Today's Vitals   01/09/24 0944 01/13/24 1306  Weight:  134 lb (60.8 kg)  Height:  5' 4 (1.626 m)  PainSc: 3     Body mass index is 23 kg/m.     01/06/2024   12:59 PM 01/12/2023   10:19 AM 04/27/2022    2:54 PM 03/16/2022    2:15 PM 05/10/2021    3:18 PM 03/12/2021   10:17 AM 04/03/2020    2:47 PM  Advanced Directives  Does Patient Have a Medical Advance Directive? Yes Yes Yes Yes Yes Yes No  Type of Estate agent  of [place];Living will Healthcare Power of [place];Living will Healthcare Power of [place];Living will Living will Living will Healthcare Power of Attorney   Does patient want to make changes to medical advance directive?  No - Patient declined No - Patient declined No - Patient declined  No - Patient declined   Copy of Healthcare Power of Attorney in Chart?  Yes - validated most recent copy scanned in chart (See row information)    Yes - validated most recent copy scanned in chart (See row information)   Would patient like information on creating a medical advance directive?     No - Patient declined  No - Patient declined    Current Medications (verified) Outpatient Encounter Medications as of 01/13/2024  Medication Sig   Acetaminophen 500 MG coapsule Take 50 mg by mouth every 6 (six) hours as needed.   aspirin EC 81 MG tablet Take 81 mg by mouth daily.   atorvastatin  (LIPITOR) 10 MG tablet TAKE 1 TABLET EVERY DAY   buprenorphine  (BUTRANS ) 5 MCG/HR PTWK Place 1 patch onto the skin once a week for 28 days.   [START ON 02/03/2024] buprenorphine  (BUTRANS ) 7.5 MCG/HR Place 1 patch onto the skin once a week for 28 days.   Calcium -Vitamin D -Vitamin K 500-100-40 MG-UNT-MCG CHEW Chew 1 capsule by mouth daily.     dorzolamide-timolol (COSOPT) 22.3-6.8 MG/ML ophthalmic solution Place 1 drop into both eyes in the morning and at bedtime.   DULoxetine  (CYMBALTA ) 60 MG capsule Take 1 capsule (60 mg total) by mouth daily. Take 1 capsule (30 mg  total) by mouth daily.   fish oil-omega-3 fatty acids 1000 MG capsule Take 2 g by mouth daily.     gabapentin (NEURONTIN) 400 MG capsule Take 400 mg by mouth 2 (two) times daily.   latanoprost (XALATAN) 0.005 % ophthalmic solution Place 1 drop into both eyes at bedtime.   Meclizine  HCl 25 MG CHEW Chew 1-2 tablets by mouth daily as needed (Veritgo).   meloxicam  (MOBIC ) 15 MG tablet TAKE 1 TABLET EVERY DAY   metoprolol  succinate (TOPROL -XL) 25 MG 24 hr tablet TAKE 1  TABLET DAILY WITH OR IMMEDIATELY FOLLOWING A MEAL. PLEASE SCHEDULE OFFICE VISIT FOR FUTURE REFILLS   Multiple Vitamins-Minerals (MULTIVITAMIN WITH MINERALS) tablet Take 1 tablet by mouth daily.     polyethylene glycol powder (GLYCOLAX/MIRALAX) 17 GM/SCOOP powder Take 17 g by mouth daily as needed.    No facility-administered encounter medications on file as of 01/13/2024.    Allergies (verified) Wasp venom   History: Past Medical History:  Diagnosis Date   Allergy 12/24/2003   Wasp venom   Cataract    left eye surgery to remove, right eye just watching currently   Constipation    Depression    Diabetes mellitus without complication (HCC)    Diverticulosis of colon    Glaucoma    ocular htn bilateral eyes - uses drops    History of blood transfusion 1994   with spinal fusion surgery   IBS (irritable bowel syndrome)    Migraine headache    resolved, no current problem   Mitral valve prolapse    patient denies this dx   Neuromuscular disorder (HCC)    essential tremor hands   Osteopenia    Osteoporosis    PAC (premature atrial contraction)    on metoprolol    Palpitations    Scoliosis    Shoulder pain    resolved, no loonger a problem   Skin cancer 2018   basal cell carcinoma on right hand   Sleep apnea    Toxic effect of venom(989.5)    Venous insufficiency    Past Surgical History:  Procedure Laterality Date   BACK SURGERY  1994   scoliosis at [facility]   CATARACT EXTRACTION Left 04/2010   COLONOSCOPY  2009   [place]- Normal   EYE SURGERY  05/05/2010,  02/05/2020   Cataracts   ROTATOR CUFF REPAIR  2006   SPINE SURGERY  09/09/1992   Spinal fusion (scoliosis)   WISDOM TOOTH EXTRACTION     Family History  Problem Relation Age of Onset   Diabetes Mother    Kidney disease Mother    Cancer Father        melanoma   Stroke Paternal Grandmother    Colon cancer Neg Hx    Rectal cancer Neg Hx    Stomach cancer Neg Hx    Esophageal cancer Neg Hx    Colon  polyps Neg Hx    Social History   Socioeconomic History   Marital status: Married    Spouse name: Elspeth   Number of children: 2   Years of education: associate degree   Highest education level: Associate degree: occupational, Scientist, product/process development, or vocational program  Occupational History   Not on file  Tobacco Use   Smoking status: Never   Smokeless tobacco: Never  Vaping Use   Vaping status: Never Used  Substance and Sexual Activity   Alcohol use: Never    Comment: rarely - holiday   Drug use: No   Sexual  activity: Yes    Partners: Male    Birth control/protection: Post-menopausal    Comment: husband vasectomy  Other Topics Concern   Not on file  Social History Narrative   Does not have a living will.   Desires CPR but would not want prolonged life support.   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       Family: 2 children - Donnice and Corporate treasurer - both [place], 3 grandchildren (age 26 to 32)      Enjoys: reading, knitting, exercise, watch TV,       Exercise: 4 times a week - 45 minute senior fitness class   Diet: tries to eat right - fish a few times a week, no beef, not as many veggies as she should      Safety   Seat belts: Yes    Guns: Yes  and secure   Safe in relationships: Yes          Social Drivers of Corporate investment banker Strain: Low Risk  (01/09/2024)   Overall Financial Resource Strain (CARDIA)    Difficulty of Paying Living Expenses: Not hard at all  Food Insecurity: No Food Insecurity (01/09/2024)   Hunger Vital Sign    Worried About Running Out of Food in the Last Year: Never true    Ran Out of Food in the Last Year: Never true  Transportation Needs: No Transportation Needs (01/09/2024)   PRAPARE - Administrator, Civil Service (Medical): No    Lack of Transportation (Non-Medical): No  Physical  Activity: Inactive (01/09/2024)   Exercise Vital Sign    Days of Exercise per Week: 0 days    Minutes of Exercise per Session: Not on file  Stress: No Stress Concern Present (01/09/2024)   [employer] of Occupational Health - Occupational Stress Questionnaire    Feeling of Stress: Only a little  Social Connections: Socially Isolated (01/09/2024)   Social Connection and Isolation Panel    Frequency of Communication with Friends and Family: Once a week    Frequency of Social Gatherings with Friends and Family: Once a week    Attends Religious Services: Never    Database administrator or Organizations: No    Attends Engineer, structural: Not on file    Marital Status: Married    Tobacco Counseling Counseling given: Not Answered   Clinical Intake:  Pre-visit preparation completed: Yes  Pain : 0-10 Pain Score: 3  Pain Type: Chronic pain Pain Location: Back Pain Orientation: Lower Pain Descriptors / Indicators: Aching Pain Onset: More than a month ago Pain Frequency: Intermittent Pain Relieving Factors: PT, stretches, ice, patch  Pain Relieving Factors: PT, stretches, ice, patch  BMI - recorded: 23 Nutritional Status: BMI of 19-24  Normal Nutritional Risks: None Diabetes: Yes CBG done?: No Did pt. bring in CBG monitor from home?: No  Lab Results  Component Value Date   HGBA1C 6.3 09/30/2023   HGBA1C 6.1 (A) 04/08/2023   HGBA1C 6.6 (H) 01/05/2023     How often do you need to have someone help you when you read instructions, pamphlets, or other written materials from your doctor or pharmacy?: 1 - Never  Interpreter Needed?: No  Comments: lives with husband Information entered by :: B.Crescencio Jozwiak,LPN   Activities of Daily Living  01/09/2024    9:44 AM  In your present state of health, do you have any difficulty performing the following activities:  Hearing? 0  Vision? 0  Difficulty concentrating or making decisions? 0  Walking or climbing stairs? 1   Dressing or bathing? 1  Doing errands, shopping? 1  Preparing Food and eating ? N  Using the Toilet? N  In the past six months, have you accidently leaked urine? N  Do you have problems with loss of bowel control? N  Managing your Medications? N  Managing your Finances? N  Housekeeping or managing your Housekeeping? Y    Patient Care Team: Avelina Greig BRAVO, MD as PCP - General (Family Medicine) Waddell Danelle ORN, MD as Consulting Physician (Cardiology) Glendia Simmonds, OD as Referring Physician (Optometry) Anderson, Dena D, PA-C as Physician Assistant (Dermatology) Maree Jannett POUR, MD as Consulting Physician (Neurology) Marea Selinda RAMAN, MD as Referring Physician (Vascular Surgery)  I have updated your Care Teams any recent Medical Services you may have received from other providers in the past year.     Assessment:   This is a routine wellness examination for Shena.  Hearing/Vision screen Hearing Screening - Comments:: Pt says her hearing is good Vision Screening - Comments:: Pt says her vision is good Dr Simmonds Glendia   Goals Addressed             This Visit's Progress    COMPLETED: Increase physical activity       Hoping to increase from 4 to 5 days a week     COMPLETED: Maintain active lifestyle.   Not on track    patient   On track    01/13/24-Maintain lifestyle     COMPLETED: Patient Stated       Lose 5 pounds and exercise more.       Depression Screen     01/06/2024   12:59 PM 10/07/2023   11:39 AM 05/21/2023   11:17 AM 01/12/2023   10:19 AM 01/05/2023    4:19 PM 04/27/2022    2:54 PM 03/16/2022    2:56 PM  PHQ 2/9 Scores  PHQ - 2 Score 0 1 2 0 0 0 0  PHQ- 9 Score  4 6 0 1  2    Fall Risk     01/09/2024    9:44 AM 01/06/2024   12:58 PM 10/07/2023   11:35 AM 01/06/2023   11:19 AM 01/05/2023    3:03 PM  Fall Risk   Falls in the past year? 1 1 0 1 1  Number falls in past yr: 1 1 0 0 0  Comment    lost balance exercising   Injury with Fall? 1 0 0 0 0  Risk for  fall due to : No Fall Risks History of fall(s);Impaired balance/gait Impaired mobility No Fall Risks History of fall(s)  Follow up Education provided;Falls prevention discussed Falls evaluation completed;Education provided Falls evaluation completed Falls evaluation completed;Education provided;Falls prevention discussed Falls evaluation completed    MEDICARE RISK AT HOME:  Medicare Risk at Home Any stairs in or around the home?: (Patient-Rptd) Yes If so, are there any without handrails?: (Patient-Rptd) No Home free of loose throw rugs in walkways, pet beds, electrical cords, etc?: (Patient-Rptd) Yes Adequate lighting in your home to reduce risk of falls?: (Patient-Rptd) Yes Life alert?: (Patient-Rptd) No Use of a cane, walker or w/c?: (Patient-Rptd) Yes Grab bars in the bathroom?: (Patient-Rptd) Yes Shower chair or bench in shower?: (Patient-Rptd) Yes Elevated toilet  seat or a handicapped toilet?: (Patient-Rptd) Yes  TIMED UP AND GO:  Was the test performed?  No  Cognitive Function: 6CIT completed    01/05/2017   11:24 AM 12/25/2015   11:20 AM  MMSE - Mini Mental State Exam  Orientation to time 5  5   Orientation to Place 5  5   Registration 3  3   Attention/ Calculation 0  0   Recall 3  3   Language- name 2 objects 0  0   Language- repeat 1 1  Language- follow 3 step command 3  3   Language- read & follow direction 0  0   Write a sentence 0  0   Copy design 0  0   Total score 20  20      Data saved with a previous flowsheet row definition        01/13/2024    1:17 PM 01/12/2023   10:23 AM 03/05/2020    9:14 AM 02/08/2019   10:15 AM  6CIT Screen  What Year? 0 points 0 points 0 points 0 points  What month? 0 points 0 points 0 points 0 points  What time? 0 points 0 points 0 points 0 points  Count back from 20 0 points 0 points 0 points 0 points  Months in reverse 0 points 0 points 0 points 0 points  Repeat phrase 0 points 0 points 2 points 0 points  Total Score 0  points 0 points 2 points 0 points    Immunizations Immunization History  Administered Date(s) Administered   Fluad Quad(high Dose 65+) 02/08/2019, 03/05/2020, 03/12/2021, 03/06/2022   Fluad Trivalent(High Dose 65+) 03/30/2023   Influenza Split 05/26/2011, 03/22/2012   Influenza Whole 03/07/2008, 03/29/2009, 03/14/2010   Influenza,inj,Quad PF,6+ Mos 03/21/2013, 02/21/2014, 03/28/2015, 04/15/2016, 03/19/2017   Influenza,trivalent, recombinat, inj, PF 03/11/2018   PFIZER Comirnaty(Gray Top)Covid-19 Tri-Sucrose Vaccine 10/29/2020   PFIZER(Purple Top)SARS-COV-2 Vaccination 07/28/2019, 08/23/2019, 04/19/2020   Pfizer Covid-19 Vaccine Bivalent Booster 2yrs & up 03/25/2021   Pfizer(Comirnaty)Fall Seasonal Vaccine 12 years and older 03/06/2022, 03/30/2023   Pneumococcal Conjugate-13 12/18/2013   Pneumococcal Polysaccharide-23 11/20/2011   Td 12/24/2014   Zoster Recombinant(Shingrix ) 06/23/2021, 09/05/2021   Zoster, Live 12/18/2013    Screening Tests Health Maintenance  Topic Date Due   Diabetic kidney evaluation - Urine ACR  Never done   COVID-19 Vaccine (8 - Pfizer risk 2024-25 season) 09/28/2023   MAMMOGRAM  12/22/2023   FOOT EXAM  01/05/2024   INFLUENZA VACCINE  01/14/2024   DEXA SCAN  02/18/2024   OPHTHALMOLOGY EXAM  02/25/2024   HEMOGLOBIN A1C  03/31/2024   Diabetic kidney evaluation - eGFR measurement  09/29/2024   DTaP/Tdap/Td (2 - Tdap) 12/23/2024   Medicare Annual Wellness (AWV)  01/12/2025   Colonoscopy  03/15/2028   Pneumococcal Vaccine: 50+ Years  Completed   Hepatitis C Screening  Completed   Zoster Vaccines- Shingrix   Completed   Hepatitis B Vaccines  Aged Out   HPV VACCINES  Aged Out   Meningococcal B Vaccine  Aged Out    Health Maintenance  Health Maintenance Due  Topic Date Due   Diabetic kidney evaluation - Urine ACR  Never done   COVID-19 Vaccine (8 - Pfizer risk 2024-25 season) 09/28/2023   MAMMOGRAM  12/22/2023   FOOT EXAM  01/05/2024   Health  Maintenance Items Addressed: Pt says she haas ab appt in September 2025 for MMG and Dexa Scan  Additional Screening:  Vision Screening: Recommended annual ophthalmology exams  for early detection of glaucoma and other disorders of the eye. Would you like a referral to an eye doctor? No    Dental Screening: Recommended annual dental exams for proper oral hygiene  Community Resource Referral / Chronic Care Management: CRR required this visit?  No   CCM required this visit?  Made Lab visit appt for CPE  Plan:    I have personally reviewed and noted the following in the patient's chart:   Medical and social history Use of alcohol, tobacco or illicit drugs  Current medications and supplements including opioid prescriptions. Patient is currently taking opioid prescriptions. Information provided to patient regarding non-opioid alternatives. Patient advised to discuss non-opioid treatment plan with their provider. Functional ability and status Nutritional status Physical activity Advanced directives List of other physicians Hospitalizations, surgeries, and ER visits in previous 12 months Vitals Screenings to include cognitive, depression, and falls Referrals and appointments  In addition, I have reviewed and discussed with patient certain preventive protocols, quality metrics, and best practice recommendations. A written personalized care plan for preventive services as well as general preventive health recommendations were provided to patient.   Erminio LITTIE Saris, LPN   [id number]   After Visit Summary: (MyChart) Due to this being a telephonic visit, the after visit summary with patients personalized plan was offered to patient via MyChart   Notes: Nothing significant to report at this time.

## 2024-01-13 NOTE — Patient Instructions (Signed)
 [name] , Thank you for taking time out of your busy schedule to complete your Annual Wellness Visit with me. I enjoyed our conversation and look forward to speaking with you again next year. I, as well as your care team,  appreciate your ongoing commitment to your health goals. Please review the following plan we discussed and let me know if I can assist you in the future. Your Game plan/ To Do List     Follow up Visits: We will see or speak with you next year for your Next Medicare AWV with our clinical staff: 01/12/25 @ 1pm televisit Have you seen your provider in the last 6 months (3 months if uncontrolled diabetes)? Yes  Clinician Recommendations:  Aim for 30 minutes of exercise or brisk walking, 6-8 glasses of water, and 5 servings of fruits and vegetables each day.       This is a list of the screenings recommended for you:  Health Maintenance  Topic Date Due   Yearly kidney health urinalysis for diabetes  Never done   COVID-19 Vaccine (8 - Pfizer risk 2024-25 season) 09/28/2023   Mammogram  12/22/2023   Complete foot exam   01/05/2024   Flu Shot  01/14/2024   DEXA scan (bone density measurement)  02/18/2024   Eye exam for diabetics  02/25/2024   Hemoglobin A1C  03/31/2024   Yearly kidney function blood test for diabetes  09/29/2024   DTaP/Tdap/Td vaccine (2 - Tdap) 12/23/2024   Medicare Annual Wellness Visit  01/12/2025   Colon Cancer Screening  03/15/2028   Pneumococcal Vaccine for age over 8  Completed   Hepatitis C Screening  Completed   Zoster (Shingles) Vaccine  Completed   Hepatitis B Vaccine  Aged Out   HPV Vaccine  Aged Out   Meningitis B Vaccine  Aged Out    Advanced directives: (In Chart) A copy of your advanced directives are scanned into your chart should your provider ever need it. Advance Care Planning is important because it:  [x]  Makes sure you receive the medical care that is consistent with your values, goals, and preferences  [x]  It provides  guidance to your family and loved ones and reduces their decisional burden about whether or not they are making the right decisions based on your wishes.  Follow the link provided in your after visit summary or read over the paperwork we have mailed to you to help you started getting your Advance Directives in place. If you need assistance in completing these, please reach out to us  so that we can help you!

## 2024-01-14 DIAGNOSIS — R2689 Other abnormalities of gait and mobility: Secondary | ICD-10-CM | POA: Diagnosis not present

## 2024-01-14 DIAGNOSIS — G8929 Other chronic pain: Secondary | ICD-10-CM | POA: Diagnosis not present

## 2024-01-14 DIAGNOSIS — M545 Low back pain, unspecified: Secondary | ICD-10-CM | POA: Diagnosis not present

## 2024-01-14 DIAGNOSIS — R293 Abnormal posture: Secondary | ICD-10-CM | POA: Diagnosis not present

## 2024-01-20 DIAGNOSIS — R2689 Other abnormalities of gait and mobility: Secondary | ICD-10-CM | POA: Diagnosis not present

## 2024-01-20 DIAGNOSIS — G8929 Other chronic pain: Secondary | ICD-10-CM | POA: Diagnosis not present

## 2024-01-20 DIAGNOSIS — R293 Abnormal posture: Secondary | ICD-10-CM | POA: Diagnosis not present

## 2024-01-20 DIAGNOSIS — M545 Low back pain, unspecified: Secondary | ICD-10-CM | POA: Diagnosis not present

## 2024-01-26 DIAGNOSIS — R293 Abnormal posture: Secondary | ICD-10-CM | POA: Diagnosis not present

## 2024-01-26 DIAGNOSIS — G8929 Other chronic pain: Secondary | ICD-10-CM | POA: Diagnosis not present

## 2024-01-26 DIAGNOSIS — R2689 Other abnormalities of gait and mobility: Secondary | ICD-10-CM | POA: Diagnosis not present

## 2024-01-26 DIAGNOSIS — M545 Low back pain, unspecified: Secondary | ICD-10-CM | POA: Diagnosis not present

## 2024-01-31 DIAGNOSIS — R2689 Other abnormalities of gait and mobility: Secondary | ICD-10-CM | POA: Diagnosis not present

## 2024-01-31 DIAGNOSIS — G8929 Other chronic pain: Secondary | ICD-10-CM | POA: Diagnosis not present

## 2024-01-31 DIAGNOSIS — R293 Abnormal posture: Secondary | ICD-10-CM | POA: Diagnosis not present

## 2024-01-31 DIAGNOSIS — M545 Low back pain, unspecified: Secondary | ICD-10-CM | POA: Diagnosis not present

## 2024-02-04 DIAGNOSIS — M545 Low back pain, unspecified: Secondary | ICD-10-CM | POA: Diagnosis not present

## 2024-02-04 DIAGNOSIS — R2689 Other abnormalities of gait and mobility: Secondary | ICD-10-CM | POA: Diagnosis not present

## 2024-02-04 DIAGNOSIS — R293 Abnormal posture: Secondary | ICD-10-CM | POA: Diagnosis not present

## 2024-02-04 DIAGNOSIS — G8929 Other chronic pain: Secondary | ICD-10-CM | POA: Diagnosis not present

## 2024-02-08 DIAGNOSIS — Z08 Encounter for follow-up examination after completed treatment for malignant neoplasm: Secondary | ICD-10-CM | POA: Diagnosis not present

## 2024-02-08 DIAGNOSIS — Z85828 Personal history of other malignant neoplasm of skin: Secondary | ICD-10-CM | POA: Diagnosis not present

## 2024-02-08 DIAGNOSIS — Z85820 Personal history of malignant melanoma of skin: Secondary | ICD-10-CM | POA: Diagnosis not present

## 2024-02-08 DIAGNOSIS — C4339 Malignant melanoma of other parts of face: Secondary | ICD-10-CM | POA: Diagnosis not present

## 2024-02-08 DIAGNOSIS — D2371 Other benign neoplasm of skin of right lower limb, including hip: Secondary | ICD-10-CM | POA: Diagnosis not present

## 2024-02-08 DIAGNOSIS — L821 Other seborrheic keratosis: Secondary | ICD-10-CM | POA: Diagnosis not present

## 2024-02-08 DIAGNOSIS — L814 Other melanin hyperpigmentation: Secondary | ICD-10-CM | POA: Diagnosis not present

## 2024-02-09 DIAGNOSIS — R2689 Other abnormalities of gait and mobility: Secondary | ICD-10-CM | POA: Diagnosis not present

## 2024-02-09 DIAGNOSIS — M545 Low back pain, unspecified: Secondary | ICD-10-CM | POA: Diagnosis not present

## 2024-02-09 DIAGNOSIS — R293 Abnormal posture: Secondary | ICD-10-CM | POA: Diagnosis not present

## 2024-02-09 DIAGNOSIS — G8929 Other chronic pain: Secondary | ICD-10-CM | POA: Diagnosis not present

## 2024-02-16 ENCOUNTER — Encounter: Payer: Self-pay | Admitting: Family Medicine

## 2024-02-20 ENCOUNTER — Emergency Department
Admission: EM | Admit: 2024-02-20 | Discharge: 2024-02-20 | Disposition: A | Discharge status: Home / Self Care | Attending: Emergency Medicine | Admitting: Emergency Medicine

## 2024-02-20 ENCOUNTER — Emergency Department

## 2024-02-20 ENCOUNTER — Other Ambulatory Visit: Payer: Self-pay

## 2024-02-20 DIAGNOSIS — W19XXXA Unspecified fall, initial encounter: Secondary | ICD-10-CM | POA: Insufficient documentation

## 2024-02-20 DIAGNOSIS — M549 Dorsalgia, unspecified: Secondary | ICD-10-CM | POA: Diagnosis not present

## 2024-02-20 DIAGNOSIS — Y92009 Unspecified place in unspecified non-institutional (private) residence as the place of occurrence of the external cause: Secondary | ICD-10-CM | POA: Diagnosis not present

## 2024-02-20 DIAGNOSIS — E119 Type 2 diabetes mellitus without complications: Secondary | ICD-10-CM | POA: Diagnosis not present

## 2024-02-20 DIAGNOSIS — M47812 Spondylosis without myelopathy or radiculopathy, cervical region: Secondary | ICD-10-CM | POA: Diagnosis not present

## 2024-02-20 DIAGNOSIS — Z743 Need for continuous supervision: Secondary | ICD-10-CM | POA: Diagnosis not present

## 2024-02-20 DIAGNOSIS — M8580 Other specified disorders of bone density and structure, unspecified site: Secondary | ICD-10-CM | POA: Diagnosis not present

## 2024-02-20 DIAGNOSIS — S0101XA Laceration without foreign body of scalp, initial encounter: Secondary | ICD-10-CM | POA: Insufficient documentation

## 2024-02-20 DIAGNOSIS — M419 Scoliosis, unspecified: Secondary | ICD-10-CM | POA: Diagnosis not present

## 2024-02-20 DIAGNOSIS — M545 Low back pain, unspecified: Secondary | ICD-10-CM | POA: Diagnosis not present

## 2024-02-20 DIAGNOSIS — R58 Hemorrhage, not elsewhere classified: Secondary | ICD-10-CM | POA: Diagnosis not present

## 2024-02-20 DIAGNOSIS — Z981 Arthrodesis status: Secondary | ICD-10-CM | POA: Diagnosis not present

## 2024-02-20 DIAGNOSIS — S0990XA Unspecified injury of head, initial encounter: Secondary | ICD-10-CM | POA: Diagnosis present

## 2024-02-20 NOTE — ED Triage Notes (Signed)
 Patient states she lost her balance at home and fell backwards; has laceration to back of head and mid back pain, no LOC and no thinners.

## 2024-02-20 NOTE — ED Provider Notes (Signed)
 [facility] Provider Note    Event Date/Time   First MD Initiated Contact with Patient 02/20/24 1547     (approximate)   History   Fall   HPI  Dana Massey is a 78 y.o. female with PMH of neuromuscular disorder, diabetes, osteoporosis, among other things listed in chart, presents for evaluation after a fall. Patient states she lost her balance and fell backwards. She did hit her head, no LOC, no blood thinners. Reports pain to the back of her head, thoracic spine and lower back.       Physical Exam   Triage Vital Signs: ED Triage Vitals  Encounter Vitals Group     BP 02/20/24 1454 (!) 155/92     Girls Systolic BP Percentile --      Girls Diastolic BP Percentile --      Boys Systolic BP Percentile --      Boys Diastolic BP Percentile --      Pulse Rate 02/20/24 1454 70     Resp 02/20/24 1454 18     Temp 02/20/24 1454 97.8 F (36.6 C)     Temp Source 02/20/24 1454 Oral     SpO2 02/20/24 1454 98 %     Weight 02/20/24 1455 135 lb (61.2 kg)     Height 02/20/24 1455 5' 4 (1.626 m)     Head Circumference --      Peak Flow --      Pain Score 02/20/24 1455 7     Pain Loc --      Pain Education --      Exclude from Growth Chart --     Most recent vital signs: Vitals:   02/20/24 1454  BP: (!) 155/92  Pulse: 70  Resp: 18  Temp: 97.8 F (36.6 C)  SpO2: 98%   General: Awake, no distress.  CV:  Good peripheral perfusion. RRR. Resp:  Normal effort. CTAB. Abd:  No distention.  Other:  Approx 2 cm laceration to the right posterior scalp, no active bleeding, PERRL, EOM intact, no focal neuro deficits, no ataxia, no pronator drift, mild TTP over the thoracic and lumbar spine. Contusion to the right posterior arm.    ED Results / Procedures / Treatments   Labs (all labs ordered are listed, but only abnormal results are displayed) Labs Reviewed - No data to display  RADIOLOGY  CT head, CT cervical spine, thoracic x-ray and lumbar x-ray  obtained, interpreted the images as well as reviewed the radiologist report.  CT head showed a small laceration otherwise no intracranial abnormalities.  CT cervical spine is negative for acute fractures.  Thoracic spine and lumbar spine x-ray are negative for acute abnormalities.   PROCEDURES:  Critical Care performed: No  .Laceration Repair  Date/Time: 02/20/2024 4:43 PM  Performed by: Cleaster Tinnie LABOR, PA-C Authorized by: Cleaster Tinnie LABOR, PA-C   Consent:    Consent obtained:  Verbal   Consent given by:  Patient   Risks, benefits, and alternatives were discussed: yes     Risks discussed:  Infection, pain and poor cosmetic result   Alternatives discussed:  No treatment Universal protocol:    Patient identity confirmed:  Verbally with patient Anesthesia:    Anesthesia method:  None Laceration details:    Location:  Scalp   Scalp location:  Occipital   Length (cm):  2   Depth (mm):  5 Exploration:    Hemostasis achieved with:  Direct pressure   Wound exploration:  entire depth of wound visualized     Contaminated: no   Treatment:    Area cleansed with:  Povidone-iodine   Amount of cleaning:  Standard   Irrigation solution:  Tap water Skin repair:    Repair method:  Staples   Number of staples:  2 Approximation:    Approximation:  Close Repair type:    Repair type:  Simple Post-procedure details:    Dressing:  Open (no dressing)   Procedure completion:  Tolerated well, no immediate complications    MEDICATIONS ORDERED IN ED: Medications - No data to display   IMPRESSION / MDM / ASSESSMENT AND PLAN / ED COURSE  I reviewed the triage vital signs and the nursing notes.                             78 year old female presents for evaluation after a mechanical fall.  Vital signs are stable aside from mildly elevated blood pressure.  Patient NAD on exam.  Differential diagnosis includes, but is not limited to, scalp laceration, contusion, spine fracture, muscle  strain.   Patient's presentation is most consistent with acute complicated illness / injury requiring diagnostic workup.  CT head, CT cervical spine, thoracic spine x-ray and lumbar spine x-ray are negative for acute abnormalities aside from a small laceration to the posterior right scalp.  Physical exam is overall reassuring.  Patient does have an approximately 2 cm laceration to her scalp which was closed using staples.  Please see procedure note for further details.    Patient advised on OTC pain management as well as topical pain relief using ice and heat.  Patient voiced understanding, all questions were answered and she was stable at discharge.      FINAL CLINICAL IMPRESSION(S) / ED DIAGNOSES   Final diagnoses:  Fall, initial encounter  Laceration of scalp, initial encounter     Rx / DC Orders   ED Discharge Orders     None        Note:  This document was prepared using Dragon voice recognition software and may include unintentional dictation errors.   Cleaster Tinnie LABOR, PA-C 02/20/24 1646    Levander Slate, MD 02/20/24 [phone]

## 2024-02-20 NOTE — Discharge Instructions (Signed)
 The CT scan of your head and cervical spine, the x-ray of your thoracic spine and lumbar spine were all negative for acute abnormalities aside from the laceration on your scalp.  The laceration on your scalp was closed with 2 staples today.  These will need to be removed in 7 days.  This can be done by your primary care provider, urgent care or the emergency department.  Please wash your hair with soap and water like normal and then pat dry.  Be careful when combing and styling your hair that you do not catch the staples.  I believe the pain in your back is due to a muscle strain since you do not have any fractures. You can take 650 mg of Tylenol and 600 mg of ibuprofen every 6 hours as needed for pain. You can use ice, heat, muscle creams and other topical pain relievers as well.  Return to the emergency department for any worsening symptoms.

## 2024-02-21 ENCOUNTER — Telehealth: Payer: Self-pay | Admitting: Student in an Organized Health Care Education/Training Program

## 2024-02-21 NOTE — Telephone Encounter (Signed)
 States she fell couple days ago and has some staples in the back of her head.  She just wants to make sure its ok if she has her procedure on Wed with Dr Marcelino.

## 2024-02-21 NOTE — Telephone Encounter (Signed)
 Having bilateral Cluneal NB on Wed. Ok to proceed?

## 2024-02-21 NOTE — Telephone Encounter (Signed)
 Patient informed.

## 2024-02-23 ENCOUNTER — Ambulatory Visit
Admission: RE | Admit: 2024-02-23 | Discharge: 2024-02-23 | Disposition: A | Discharge status: Home / Self Care | Source: Ambulatory Visit | Attending: Student in an Organized Health Care Education/Training Program | Admitting: Student in an Organized Health Care Education/Training Program

## 2024-02-23 ENCOUNTER — Encounter: Payer: Self-pay | Admitting: Student in an Organized Health Care Education/Training Program

## 2024-02-23 ENCOUNTER — Ambulatory Visit (HOSPITAL_BASED_OUTPATIENT_CLINIC_OR_DEPARTMENT_OTHER): Admitting: Student in an Organized Health Care Education/Training Program

## 2024-02-23 VITALS — BP 131/67 | HR 70 | Temp 97.3°F | Resp 15 | Ht 64.0 in | Wt 135.0 lb

## 2024-02-23 DIAGNOSIS — M549 Dorsalgia, unspecified: Secondary | ICD-10-CM | POA: Diagnosis not present

## 2024-02-23 DIAGNOSIS — G894 Chronic pain syndrome: Secondary | ICD-10-CM

## 2024-02-23 DIAGNOSIS — G588 Other specified mononeuropathies: Secondary | ICD-10-CM | POA: Diagnosis not present

## 2024-02-23 DIAGNOSIS — M792 Neuralgia and neuritis, unspecified: Secondary | ICD-10-CM | POA: Diagnosis not present

## 2024-02-23 MED ORDER — IOHEXOL 180 MG/ML  SOLN
INTRAMUSCULAR | Status: AC
  Filled 2024-02-23: qty 20

## 2024-02-23 MED ORDER — LIDOCAINE HCL 2 % IJ SOLN
INTRAMUSCULAR | Status: AC
  Filled 2024-02-23: qty 20

## 2024-02-23 MED ORDER — DEXAMETHASONE SODIUM PHOSPHATE 10 MG/ML IJ SOLN
INTRAMUSCULAR | Status: AC
  Filled 2024-02-23: qty 1

## 2024-02-23 MED ORDER — ROPIVACAINE HCL 2 MG/ML IJ SOLN
INTRAMUSCULAR | Status: AC
  Filled 2024-02-23: qty 20

## 2024-02-23 MED ORDER — ROPIVACAINE HCL 2 MG/ML IJ SOLN
18.0000 mL | Freq: Once | INTRAMUSCULAR | Status: AC
  Administered 2024-02-23: 18 mL via PERINEURAL

## 2024-02-23 MED ORDER — LIDOCAINE HCL 2 % IJ SOLN
20.0000 mL | Freq: Once | INTRAMUSCULAR | Status: AC
  Administered 2024-02-23: 200 mg

## 2024-02-23 MED ORDER — BUPRENORPHINE 7.5 MCG/HR TD PTWK: 1.0000 | MEDICATED_PATCH | TRANSDERMAL | 2 refills | Status: DC

## 2024-02-23 MED ORDER — DEXAMETHASONE SODIUM PHOSPHATE 10 MG/ML IJ SOLN
20.0000 mg | Freq: Once | INTRAMUSCULAR | Status: AC
  Administered 2024-02-23: 20 mg

## 2024-02-23 NOTE — Patient Instructions (Signed)

## 2024-02-23 NOTE — Progress Notes (Signed)
 PROVIDER NOTE: Interpretation of information contained herein should be left to medically-trained personnel. Specific patient instructions are provided elsewhere under Patient Instructions section of medical record. This document was created in part using STT-dictation technology, any transcriptional errors that may result from this process are unintentional.  Patient: Dana Massey Type: Established DOB: 1945-09-29 MRN: [id number] PCP: Avelina Greig BRAVO, MD  Service: Procedure DOS: 02/23/2024 Setting: Ambulatory Location: Ambulatory outpatient facility Delivery: Face-to-face Provider: Wallie Sherry, MD Specialty: Interventional Pain Management Specialty designation: 09 Location: Outpatient facility Ref. Prov.: Avelina Greig BRAVO, MD       Interventional Therapy  Bilateral cluneal nerve block  Imaging: Fluoroscopy-guided Non-spinal ([id number]) Anesthesia: Local anesthesia (1-2% Lidocaine ) DOS: 02/23/2024  Performed by: Wallie Sherry, MD  Purpose: Diagnostic/Therapeutic Indications: Cluneal neuralgia Rationale (medical necessity): procedure needed and proper for the diagnosis and/or treatment of Dana Massey's medical symptoms and needs. 1. Cluneal neuropathy   2. Chronic pain syndrome    NAS-11 Pain score:   Pre-procedure: 3 /10   Post-procedure: 0-No pain/10      Position / Prep / Materials:  Position: Prone  Prep solution: ChloraPrep (2% chlorhexidine gluconate and 70% isopropyl alcohol) Prep Area: Entire posterior lumbosacral area  Materials:  Tray: Block Needle(s):  Type: Spinal  Gauge (G): 22  Length: 3.5-in Qty: One (1) per procedure side.  H&P (Pre-op Assessment):  Dana Massey is a 78 y.o. (year old), female patient, seen today for interventional treatment. She  has a past surgical history that includes Back surgery (1994); Rotator cuff repair (2006); Cataract extraction (Left, 04/2010); Wisdom tooth extraction; Colonoscopy (2009); Eye surgery (05/05/2010,   02/05/2020); and Spine surgery (09/09/1992). Dana Massey has a current medication list which includes the following prescription(s): acetaminophen, aspirin ec, atorvastatin , calcium -vitamin d -vitamin k, dorzolamide-timolol, duloxetine , fish oil-omega-3 fatty acids, gabapentin, latanoprost, meclizine  hcl, metoprolol  succinate, multivitamin with minerals, polyethylene glycol powder, buprenorphine , and meloxicam . Her primarily concern today is the Back Pain  Initial Vital Signs:  Pulse/HCG Rate: 70ECG Heart Rate: 65 Temp: (!) 97.3 F (36.3 C) Resp: 16 BP: 132/65 SpO2: 97 %  BMI: Estimated body mass index is 23.17 kg/m as calculated from the following:   Height as of this encounter: 5' 4 (1.626 m).   Weight as of this encounter: 135 lb (61.2 kg).  Risk Assessment: Allergies: Reviewed. She is allergic to wasp venom.  Allergy Precautions: None required Coagulopathies: Reviewed. None identified.  Blood-thinner therapy: None at this time Active Infection(s): Reviewed. None identified. Dana Massey is afebrile  Site Confirmation: Dana Massey was asked to confirm the procedure and laterality before marking the site Procedure checklist: Completed Consent: Before the procedure and under the influence of no sedative(s), amnesic(s), or anxiolytics, the patient was informed of the treatment options, risks and possible complications. To fulfill our ethical and legal obligations, as recommended by the American Medical Association's Code of Ethics, I have informed the patient of my clinical impression; the nature and purpose of the treatment or procedure; the risks, benefits, and possible complications of the intervention; the alternatives, including doing nothing; the risk(s) and benefit(s) of the alternative treatment(s) or procedure(s); and the risk(s) and benefit(s) of doing nothing. The patient was provided information about the general risks and possible complications associated with the procedure.  These may include, but are not limited to: failure to achieve desired goals, infection, bleeding, organ or nerve damage, allergic reactions, paralysis, and death. In addition, the patient was informed of those risks and complications associated to the procedure, such as failure to  decrease pain; infection; bleeding; organ or nerve damage with subsequent damage to sensory, motor, and/or autonomic systems, resulting in permanent pain, numbness, and/or weakness of one or several areas of the body; allergic reactions; (i.e.: anaphylactic reaction); and/or death. Furthermore, the patient was informed of those risks and complications associated with the medications. These include, but are not limited to: allergic reactions (i.e.: anaphylactic or anaphylactoid reaction(s)); adrenal axis suppression; blood sugar elevation that in diabetics may result in ketoacidosis or comma; water retention that in patients with history of congestive heart failure may result in shortness of breath, pulmonary edema, and decompensation with resultant heart failure; weight gain; swelling or edema; medication-induced neural toxicity; particulate matter embolism and blood vessel occlusion with resultant organ, and/or nervous system infarction; and/or aseptic necrosis of one or more joints. Finally, the patient was informed that Medicine is not an exact science; therefore, there is also the possibility of unforeseen or unpredictable risks and/or possible complications that may result in a catastrophic outcome. The patient indicated having understood very clearly. We have given the patient no guarantees and we have made no promises. Enough time was given to the patient to ask questions, all of which were answered to the patient's satisfaction. Dana Massey has indicated that she wanted to continue with the procedure. Attestation: I, the ordering provider, attest that I have discussed with the patient the benefits, risks, side-effects,  alternatives, likelihood of achieving goals, and potential problems during recovery for the procedure that I have provided informed consent. Date  Time: 02/23/2024 10:27 AM  Pre-Procedure Preparation:  Monitoring: As per clinic protocol. Respiration, ETCO2, SpO2, BP, heart rate and rhythm monitor placed and checked for adequate function Safety Precautions: Patient was assessed for positional comfort and pressure points before starting the procedure. Time-out: I initiated and conducted the Time-out before starting the procedure, as per protocol. The patient was asked to participate by confirming the accuracy of the Time Out information. Verification of the correct person, site, and procedure were performed and confirmed by me, the nursing staff, and the patient. Time-out conducted as per Joint Commission's Universal Protocol (UP.01.01.01). Time: 1126 Start Time: 1126 hrs.  Description/Narrative of Procedure:          Start Time: 1126 hrs.  The skin over the right posterior iliac crest region was prepped with chlorhexidine and draped in a sterile fashion. Using anatomic landmarks and fluoroscopic guidance, a 25-gauge spinal needle was advanced to the region approximately 7-8 cm lateral to midline and 1-2 cm superior to the right posterior superior iliac spine (PSIS), consistent with the expected course of the right superior cluneal nerve.  After negative aspiration, 10 mL of injectate composed of: 8 mL of 0.2% Ropivacaine , and 2 mL of Decadron  (10 mg/mL) was injected slowly without resistance.  This was also done for the left side. 10 mL of injectate composed of: 8 mL of 0.2% Ropivacaine , and 2 mL of Decadron  (10 mg/mL) was injected slowly without resistance for left cluneal nerves  The needle was withdrawn and hemostasis achieved. The patient tolerated the procedure well without immediate complications.   Vitals:   02/23/24 1121 02/23/24 1126 02/23/24 1131 02/23/24 1133  BP: 123/64  122/66 127/65 131/67  Pulse:      Resp: 15 19 20 15   Temp:      SpO2: 90% 93% 91% 92%  Weight:      Height:         End Time: 1133 hrs.  Imaging Guidance (Non-Spinal):  Type of Imaging Technique: Fluoroscopy Guidance (Non-Spinal) Indication(s): Fluoroscopy guidance for needle placement to enhance accuracy in procedures requiring precise needle localization for targeted delivery of medication in or near specific anatomical locations not easily accessible without such real-time imaging assistance. Exposure Time: Please see nurses notes. Contrast: None used. Fluoroscopic Guidance: I was personally present during the use of fluoroscopy. Tunnel Vision Technique used to obtain the best possible view of the target area. Parallax error corrected before commencing the procedure. Direction-depth-direction technique used to introduce the needle under continuous pulsed fluoroscopy. Once target was reached, antero-posterior, oblique, and lateral fluoroscopic projection used confirm needle placement in all planes. Images permanently stored in EMR. Interpretation: No contrast injected. I personally interpreted the imaging intraoperatively. Adequate needle placement confirmed in multiple planes. Permanent images saved into the patient's record.  Post-operative Assessment:  Post-procedure Vital Signs:  Pulse/HCG Rate: 7086 Temp: (!) 97.3 F (36.3 C) Resp: 15 BP: 131/67 SpO2: 92 %  EBL: None  Complications: No immediate post-treatment complications observed by team, or reported by patient.  Note: The patient tolerated the entire procedure well. A repeat set of vitals were taken after the procedure and the patient was kept under observation following institutional policy, for this type of procedure. Post-procedural neurological assessment was performed, showing return to baseline, prior to discharge. The patient was provided with post-procedure discharge instructions, including a section on  how to identify potential problems. Should any problems arise concerning this procedure, the patient was given instructions to immediately contact us , at any time, without hesitation. In any case, we plan to contact the patient by telephone for a follow-up status report regarding this interventional procedure.  Comments:  No additional relevant information.  Plan of Care (POC)  Orders:  Orders Placed This Encounter  Procedures   DG PAIN CLINIC C-ARM 1-60 MIN NO REPORT    Intraoperative interpretation by procedural physician at [facility].    Standing Status:   Standing    Number of Occurrences:   1    Reason for exam::   Assistance in needle guidance and placement for procedures requiring needle placement in or near specific anatomical locations not easily accessible without such assistance.   PMP checked and reviewed, refill of buprenorphine  7.5 mcg patch as below.  She is tolerating this well and endorsing analgesic benefit with it  Medications ordered for procedure: Meds ordered this encounter  Medications   lidocaine  (XYLOCAINE ) 2 % (with pres) injection 400 mg   ropivacaine  (PF) 2 mg/mL (0.2%) (NAROPIN ) injection 18 mL   dexamethasone  (DECADRON ) injection 20 mg   buprenorphine  (BUTRANS ) 7.5 MCG/HR    Sig: Place 1 patch onto the skin once a week.    Dispense:  4 patch    Refill:  2    Chronic Pain: STOP Act (Not applicable) Fill 1 day early if closed on refill date. Avoid benzodiazepines within 8 hours of opioids   Medications administered: We administered lidocaine , ropivacaine  (PF) 2 mg/mL (0.2%), and dexamethasone .  See the medical record for exact dosing, route, and time of administration.    Bilateral cluneal nerve block 02/23/2024    Follow-up plan:   Return in about 4 weeks (around 03/22/2024) for PPE, F2F.     Recent Visits Date Type Provider Dept  01/06/24 Office Visit Marcelino Nurse, MD [facility]  Showing recent visits within past 90 days  and meeting all other requirements Today's Visits Date Type Provider Dept  02/23/24 Procedure visit Marcelino Nurse, MD [facility]  Showing today's visits and meeting all other requirements Future Appointments Date Type Provider Dept  03/23/24 Appointment Marcelino Nurse, MD [facility]  Showing future appointments within next 90 days and meeting all other requirements   Disposition: Discharge home  Discharge (Date  Time): 02/23/2024; 1139 hrs.   Primary Care Physician: Avelina Greig BRAVO, MD Location: [facility] Note by: Nurse Marcelino, MD (TTS technology used. I apologize for any typographical errors that were not detected and corrected.) Date: 02/23/2024; Time: 12:21 PM  Disclaimer:  Medicine is not an Visual merchandiser. The only guarantee in medicine is that nothing is guaranteed. It is important to note that the decision to proceed with this intervention was based on the information collected from the patient. The Data and conclusions were drawn from the patient's questionnaire, the interview, and the physical examination. Because the information was provided in large part by the patient, it cannot be guaranteed that it has not been purposely or unconsciously manipulated. Every effort has been made to obtain as much relevant data as possible for this evaluation. It is important to note that the conclusions that lead to this procedure are derived in large part from the available data. Always take into account that the treatment will also be dependent on availability of resources and existing treatment guidelines, considered by other Pain Management Practitioners as being common knowledge and practice, at the time of the intervention. For Medico-Legal purposes, it is also important to point out that variation in procedural techniques and pharmacological choices are the acceptable norm. The indications, contraindications, technique, and results of the above  procedure should only be interpreted and judged by a Board-Certified Interventional Pain Specialist with extensive familiarity and expertise in the same exact procedure and technique.

## 2024-02-23 NOTE — Progress Notes (Signed)
 Safety precautions to be maintained throughout the outpatient stay will include: orient to surroundings, keep bed in low position, maintain call bell within reach at all times, provide assistance with transfer out of bed and ambulation.

## 2024-02-24 ENCOUNTER — Telehealth: Payer: Self-pay | Admitting: *Deleted

## 2024-02-24 NOTE — Telephone Encounter (Signed)
 Post procedure call:   no  questions or concerns.

## 2024-02-25 ENCOUNTER — Encounter: Payer: Self-pay | Admitting: Family Medicine

## 2024-02-28 ENCOUNTER — Telehealth: Payer: Self-pay

## 2024-02-28 ENCOUNTER — Ambulatory Visit

## 2024-02-28 VITALS — BP 118/76 | HR 83 | Temp 97.7°F | Ht 64.0 in | Wt 137.0 lb

## 2024-02-28 DIAGNOSIS — R2681 Unsteadiness on feet: Secondary | ICD-10-CM | POA: Diagnosis not present

## 2024-02-28 DIAGNOSIS — Z4802 Encounter for removal of sutures: Secondary | ICD-10-CM

## 2024-02-28 DIAGNOSIS — Z1231 Encounter for screening mammogram for malignant neoplasm of breast: Secondary | ICD-10-CM | POA: Diagnosis not present

## 2024-02-28 DIAGNOSIS — H401131 Primary open-angle glaucoma, bilateral, mild stage: Secondary | ICD-10-CM | POA: Diagnosis not present

## 2024-02-28 LAB — HM MAMMOGRAPHY

## 2024-02-28 LAB — HM DIABETES EYE EXAM

## 2024-02-28 MED ORDER — ROLLER WALKER MISC: 1.0000 | Freq: Once | 0 refills | Status: AC

## 2024-02-28 NOTE — Telephone Encounter (Signed)
 Left message on VM per DPR that I have a written rx for her for a rolling walker so they can take it to wherever her insurance covers her to get it. It is in the Waiting on Patient folder at my desk.

## 2024-02-28 NOTE — Progress Notes (Signed)
 Subjective:    Patient ID: Dana Massey, female    DOB: [birth year], 78 y.o.   MRN: [id number]   Dana Massey is a very pleasant 78 y.o. female who presents today  Says she was in the kitchen, had her cain in one hand and lost her balance, landed on her head, two falls the past 81mo, also lost her balance on previous fall as well No sx prior to falls Light headedness when going from sitting to standing/ lying to sitting,  Been in PT for strength and balance training x87mo but hasn't really noticed any different.  Exercise with weights, stretching, lifts weights twice weekly.   Review of Systems  All other systems reviewed and are negative.       Allergies  Allergen Reactions   Wasp Venom Anaphylaxis    Current Outpatient Medications on File Prior to Visit  Medication Sig Dispense Refill   Acetaminophen 500 MG coapsule Take 50 mg by mouth every 6 (six) hours as needed.     aspirin EC 81 MG tablet Take 81 mg by mouth daily.     atorvastatin  (LIPITOR) 10 MG tablet TAKE 1 TABLET EVERY DAY 90 tablet 3   buprenorphine  (BUTRANS ) 7.5 MCG/HR Place 1 patch onto the skin once a week. 4 patch 2   Calcium -Vitamin D -Vitamin K 500-100-40 MG-UNT-MCG CHEW Chew 1 capsule by mouth daily.       dorzolamide-timolol (COSOPT) 22.3-6.8 MG/ML ophthalmic solution Place 1 drop into both eyes in the morning and at bedtime.     DULoxetine  (CYMBALTA ) 60 MG capsule Take 1 capsule (60 mg total) by mouth daily. Take 1 capsule (30 mg total) by mouth daily. 90 capsule 1   fish oil-omega-3 fatty acids 1000 MG capsule Take 2 g by mouth daily.       gabapentin (NEURONTIN) 400 MG capsule Take 400 mg by mouth 2 (two) times daily.     latanoprost (XALATAN) 0.005 % ophthalmic solution Place 1 drop into both eyes at bedtime.     Meclizine  HCl 25 MG CHEW Chew 1-2 tablets by mouth daily as needed (Veritgo).     meloxicam  (MOBIC ) 15 MG tablet TAKE 1 TABLET EVERY DAY (Patient not taking: Reported on 02/23/2024) 90  tablet 3   metoprolol  succinate (TOPROL -XL) 25 MG 24 hr tablet TAKE 1 TABLET DAILY WITH OR IMMEDIATELY FOLLOWING A MEAL. PLEASE SCHEDULE OFFICE VISIT FOR FUTURE REFILLS 90 tablet 3   Multiple Vitamins-Minerals (MULTIVITAMIN WITH MINERALS) tablet Take 1 tablet by mouth daily.       polyethylene glycol powder (GLYCOLAX/MIRALAX) 17 GM/SCOOP powder Take 17 g by mouth daily as needed.      No current facility-administered medications on file prior to visit.    LMP 09/13/2000   Objective:    Physical Exam Vitals and nursing note reviewed.  Constitutional:      Appearance: Normal appearance.  HENT:     Head: Normocephalic and atraumatic.      Comments: Stapled wound present on the occipital scalp, right medial area, with surrounding ecchymoses, well-approximated and healing well. Eyes:     Extraocular Movements: Extraocular movements intact.     Conjunctiva/sclera: Conjunctivae normal.  Skin:    General: Skin is warm.  Neurological:     Mental Status: She is alert.  Psychiatric:        Mood and Affect: Mood normal.        Behavior: Behavior normal.            Assessment &  Plan:   1. Encounter for staple removal (Primary) Per chart review, patient was seen in the ED on 9/7 for mechanical fall where she sustained a laceration to the scalp, staples were placed.  Imaging was negative and patient was discharged without incident.  On clinical presentation today, the scalp wound is well-healed, with some surrounding ecchymosis, staples removed without incident, see procedure note below.  Counseled patient about wound care precautions.  Suture Removal  Date/Time: 02/28/2024 10:20 AM  Performed by: Kelena Garrow K, MD Authorized by: Bennett Reuben POUR, MD  Body area: head/neck Location details: scalp Wound Appearance: clean (Surrounding ecchymosis present) Staples Removed: 2 Patient tolerance: patient tolerated the procedure well with no immediate complications     2. Gait  instability Patient has reportedly fallen twice in the past 6 months, no suspicion for cardiac causes for vertigo given lack of symptoms prior to the falls.  Some reports of orthostatic symptoms, however, falls never occur with changes in position, exclusively occurring while she is standing doing various tasks around the house.  Patient has seen little improvement in her balance with physical therapy/balance and strength training, recommended she start gentle Pilates to improve muscle strength and balance.  Will escalate and send patient a wheeled walker given that she falls despite using her cane and is needing physical support for balance from her husband despite using her cane.  -Wheeled walker order sent   Reuben POUR Bennett, MD  02/28/24

## 2024-02-28 NOTE — Addendum Note (Signed)
 Addended by: KALLIE CLOTILDA SQUIBB on: 02/28/2024 11:49 AM   Modules accepted: Orders

## 2024-02-29 ENCOUNTER — Ambulatory Visit: Payer: Self-pay | Admitting: Family Medicine

## 2024-03-02 ENCOUNTER — Encounter: Payer: Self-pay | Admitting: Family Medicine

## 2024-03-10 NOTE — Telephone Encounter (Signed)
 I have sent a community message to [employer] [facility] asking for follow up on order for walker placed on 02/28/2024.

## 2024-03-17 ENCOUNTER — Telehealth: Payer: Self-pay | Admitting: *Deleted

## 2024-03-17 DIAGNOSIS — E1122 Type 2 diabetes mellitus with diabetic chronic kidney disease: Secondary | ICD-10-CM

## 2024-03-17 NOTE — Telephone Encounter (Signed)
-----   Message from Harlene Du sent at 03/17/2024  4:16 PM EDT ----- Regarding: RE: Lab Tues 04/04/24 This one sorry ----- Message ----- From: Wendell Arland RAMAN, CMA Sent: 03/17/2024   3:54 PM EDT To: Harlene Du Subject: FW: Lab Tues 04/04/24                          I don't see a patient's name on this request for lab orders.  Arland ----- Message ----- From: Du Harlene Sent: 03/17/2024   1:58 PM EDT To: Greig FORBES Ring, MD Subject: Lab Tues 04/04/24                              Hello,  Patient is coming in for CPE labs on Tuesday 04/04/24. Can we get orders please.   Thanks

## 2024-03-23 ENCOUNTER — Encounter: Payer: Self-pay | Admitting: Family Medicine

## 2024-03-23 ENCOUNTER — Encounter: Payer: Self-pay | Admitting: Student in an Organized Health Care Education/Training Program

## 2024-03-23 ENCOUNTER — Ambulatory Visit
Attending: Student in an Organized Health Care Education/Training Program | Admitting: Student in an Organized Health Care Education/Training Program

## 2024-03-23 VITALS — BP 123/76 | HR 73 | Temp 97.4°F | Resp 16 | Ht 64.0 in | Wt 134.0 lb

## 2024-03-23 DIAGNOSIS — G588 Other specified mononeuropathies: Secondary | ICD-10-CM | POA: Insufficient documentation

## 2024-03-23 DIAGNOSIS — Z981 Arthrodesis status: Secondary | ICD-10-CM | POA: Diagnosis not present

## 2024-03-23 DIAGNOSIS — M533 Sacrococcygeal disorders, not elsewhere classified: Secondary | ICD-10-CM | POA: Diagnosis not present

## 2024-03-23 DIAGNOSIS — G5703 Lesion of sciatic nerve, bilateral lower limbs: Secondary | ICD-10-CM | POA: Insufficient documentation

## 2024-03-23 DIAGNOSIS — M461 Sacroiliitis, not elsewhere classified: Secondary | ICD-10-CM | POA: Insufficient documentation

## 2024-03-23 DIAGNOSIS — G894 Chronic pain syndrome: Secondary | ICD-10-CM | POA: Diagnosis not present

## 2024-03-23 NOTE — Progress Notes (Signed)
 Safety precautions to be maintained throughout the outpatient stay will include: orient to surroundings, keep bed in low position, maintain call bell within reach at all times, provide assistance with transfer out of bed and ambulation.

## 2024-03-23 NOTE — Patient Instructions (Signed)

## 2024-03-23 NOTE — Progress Notes (Signed)
 PROVIDER NOTE: Interpretation of information contained herein should be left to medically-trained personnel. Specific patient instructions are provided elsewhere under Patient Instructions section of medical record. This document was created in part using AI and STT-dictation technology, any transcriptional errors that may result from this process are unintentional.  Patient: Dana Massey  Service: E/M   PCP: Avelina Greig BRAVO, MD  DOB: 02/03/1946  DOS: 03/23/2024  Provider: Wallie Sherry, MD  MRN: [id number]  Delivery: Face-to-face  Specialty: Interventional Pain Management  Type: Established Patient  Setting: Ambulatory outpatient facility  Specialty designation: 09  Referring Prov.: Avelina Greig BRAVO, MD  Location: Outpatient office facility       History of present illness (HPI) Dana Massey, a 78 y.o. year old female, is here today because of her Sacroiliac joint pain [M53.3]. Ms. Sanmiguel primary complain today is Hip Pain (Bilaterally ) and Other (Buttocks. )   Pain Assessment: Severity of Chronic pain is reported as a 2 /10. Location: Hip Right, Left/into buttocks. Onset: More than a month ago. Quality: Aching, Discomfort, Nagging. Timing: Intermittent. Modifying factor(s): lying down, Butrans  patches which she feels are not working well. Vitals:  height is 5' 4 (1.626 m) and weight is 134 lb (60.8 kg). Her temporal temperature is 97.4 F (36.3 C) (abnormal). Her blood pressure is 123/76 and her pulse is 73. Her respiration is 16 and oxygen  saturation is 99%.  BMI: Estimated body mass index is 23 kg/m as calculated from the following:   Height as of this encounter: 5' 4 (1.626 m).   Weight as of this encounter: 134 lb (60.8 kg).  Last encounter: 01/06/2024. Last procedure: 02/23/2024.  Reason for encounter: post-procedure evaluation and assessment.   Post-Procedure Evaluation   Effectiveness:  Initial hour after procedure: 80 %  Subsequent 4-6 hours post-procedure: 80 %   Analgesia past initial 6 hours: 80 % (pain relief was good for a few days and then gradually returned and is back to preprocedure pain.)  Ongoing improvement:  Analgesic: Back to baseline    History of Present Illness   Dana Massey is a 78 year old female with diabetes who presents with burning and tingling in her feet and numbness.  She experiences burning and tingling sensations in her feet, with numbness being more problematic. These symptoms have been persistent.  She has a history of extensive lumbar spine fusion surgery performed in 1994. Since the surgery, she has been experiencing chronic lumbar pain. She has received nerve blocks along the iliac crest targeting the cluneal nerves, but these have provided relief for less than a week.  No changes in her medical history were noted.       HPI from initial clinic visit:  Dana Massey is a 78 year old female with scoliosis and significant spinal fusion from T11 to sacrum who presents with worsening back pain and sciatica. She was referred by her primary care doctor for evaluation of her chronic back pain and sciatica.   She has a history of scoliosis diagnosed in her teenage years, which progressively worsened, leading to a spinal fusion surgery in 1994 with the placement of twelve screws from T11 to the sacrum. Over time, her back pain has worsened.   She experiences chronic back pain, primarily presenting as low buttock pain, with some radiation. She manages her pain with gabapentin 400 mg twice daily and meloxicam , which was started about five to six months ago. Gabapentin does not seem to be effective in alleviating  her pain.  She also experiences pain overlying her iliac crest as well as in her buttock over her SI joint and piriformis.   She was diagnosed with peripheral neuropathy by a neurologist, characterized by weakness in her leg and burning or tingling sensations in her feet. She is also a type two diabetic but is  not on insulin therapy.   In the past, she received two injections at [facility] by Rankin Rogue, although she is unsure if these were epidural injections. The focus of these injections was on the sacroiliac joints.   Her family history includes her mother having spine problems and being on hydrocodone for pain management towards the end of her life.   UDS:  Summary  Date Value Ref Range Status  01/06/2024 FINAL  Final    Comment:    ==================================================================== Compliance Drug Analysis, Ur ==================================================================== Test                             Result       Flag       Units  Drug Present and Declared for Prescription Verification   Gabapentin                     PRESENT      EXPECTED   Duloxetine                      PRESENT      EXPECTED   Metoprolol                      PRESENT      EXPECTED  Drug Absent but Declared for Prescription Verification   Buprenorphine                   Not Detected UNEXPECTED ng/mg creat    Transdermal buprenorphine , as indicated in the declared medication    list, is not always detected even when used as directed.    Acetaminophen                  Not Detected UNEXPECTED    Acetaminophen, as indicated in the declared medication list, is not    always detected even when used as directed.    Salicylate                     Not Detected UNEXPECTED    Aspirin, as indicated in the declared medication list, is not always    detected even when used as directed.  ==================================================================== Test                      Result    Flag   Units      Ref Range   Creatinine              96               mg/dL      >=79 ==================================================================== Declared Medications:  The flagging and interpretation on this report are based on the  following declared medications.  Unexpected results may arise  from  inaccuracies in the declared medications.   **Note: The testing scope of this panel includes these medications:   Duloxetine  (Cymbalta )  Gabapentin (Neurontin)  Metoprolol  (Toprol )   **Note: The testing scope of this panel does not include small to  moderate amounts of these reported medications:   Acetaminophen  Aspirin  Buprenorphine  Patch (BuTrans )   **Note: The testing scope of this panel does not include the  following reported medications:   Atorvastatin  (Lipitor)  Calcium   Dorzolamide  Latanoprost (Xalatan)  Meclizine   Meloxicam  (Mobic )  Multivitamin  Omega-3 Fatty Acids  Polyethylene Glycol  Timolol  Vitamin D  ==================================================================== For clinical consultation, please call [phone]. ====================================================================     No results found for: CBDTHCR No results found for: D8THCCBX No results found for: D9THCCBX  ROS  Constitutional: Denies any fever or chills Gastrointestinal: No reported hemesis, hematochezia, vomiting, or acute GI distress Musculoskeletal: Bilateral SI joint and piriformis pain Neurological: No reported episodes of acute onset apraxia, aphasia, dysarthria, agnosia, amnesia, paralysis, loss of coordination, or loss of consciousness  Medication Review  Acetaminophen, Calcium -Vitamin D -Vitamin K, DULoxetine , Meclizine  HCl, aspirin EC, atorvastatin , buprenorphine , dorzolamide-timolol, fish oil-omega-3 fatty acids, gabapentin, latanoprost, meloxicam , metoprolol  succinate, multivitamin with minerals, and polyethylene glycol powder  History Review  Allergy: Ms. Armor is allergic to wasp venom. Drug: Ms. Elgin  reports no history of drug use. Alcohol:  reports no history of alcohol use. Tobacco:  reports that she has never smoked. She has never used smokeless tobacco. Social: Ms. Schou  reports that she has never smoked. She has never used  smokeless tobacco. She reports that she does not drink alcohol and does not use drugs. Medical:  has a past medical history of Allergy (12/24/2003), Cataract, Constipation, Depression, Diabetes mellitus without complication (HCC), Diverticulosis of colon, Glaucoma, History of blood transfusion (1994), IBS (irritable bowel syndrome), Migraine headache, Mitral valve prolapse, Neuromuscular disorder (HCC), Osteopenia, Osteoporosis, PAC (premature atrial contraction), Palpitations, Scoliosis, Shoulder pain, Skin cancer (2018), Sleep apnea, Toxic effect of venom(989.5), and Venous insufficiency. Surgical: Ms. Eisenhart  has a past surgical history that includes Back surgery (1994); Rotator cuff repair (2006); Cataract extraction (Left, 04/2010); Wisdom tooth extraction; Colonoscopy (2009); Eye surgery (05/05/2010,  02/05/2020); and Spine surgery (09/09/1992). Family: family history includes Cancer in her father; Diabetes in her mother; Kidney disease in her mother; Stroke in her paternal grandmother.  Laboratory Chemistry Profile   Renal Lab Results  Component Value Date   BUN 14 09/30/2023   CREATININE 0.80 09/30/2023   GFR 71.26 09/30/2023   GFRAA 54 (L) 07/05/2019   GFRNONAA 47 (L) 07/05/2019    Hepatic Lab Results  Component Value Date   AST 28 09/30/2023   ALT 21 09/30/2023   ALBUMIN 4.5 09/30/2023   ALKPHOS 55 09/30/2023   HCVAB NEGATIVE 12/25/2015    Electrolytes Lab Results  Component Value Date   NA 137 09/30/2023   K 4.4 09/30/2023   CL 97 09/30/2023   CALCIUM  9.3 09/30/2023    Bone Lab Results  Component Value Date   VD25OH 63.48 02/08/2019    Inflammation (CRP: Acute Phase) (ESR: Chronic Phase) No results found for: CRP, ESRSEDRATE, LATICACIDVEN       Note: Above Lab results reviewed.  Recent Imaging Review  MR LUMBAR SPINE WO CONTRAST   Narrative CLINICAL DATA:  Pain of left sacroiliac joint. Additional history provided by scanning technologist: Patient  reports low back pain with left leg pain for 6 months. Pain radiates to left hip. Previous fusion in 1994.   EXAM: MRI LUMBAR SPINE WITHOUT CONTRAST   TECHNIQUE: Multiplanar, multisequence MR imaging of the lumbar spine was performed. No intravenous contrast was administered.   COMPARISON:  No pertinent prior exams available for comparison.   FINDINGS: Despite the use of metal artifact reduction sequences, there is severe susceptibility  artifact arising from a posterior spinal fusion construct extending from the thoracic spine to the level of the sacrum. This significantly obscures the vertebrae, spinal canal and neural foramina, and significantly limits evaluation. It also significantly limits evaluation for marrow edema/osseous lesions.   Segmentation: For the purposes of this dictation, five lumbar vertebrae are assumed and the caudal most well-formed intervertebral disc is designated L5-S1.   Alignment: Lumbar levocurvature. Mild L1-L2 grade 1 retrolisthesis.   Vertebrae: No vertebral body height loss, focal suspicious marrow lesion or significant marrow edema is identified (although susceptibility artifact significantly limits evaluation).   Conus medullaris and cauda equina: Susceptibility artifact largely obscures the spinal canal, precluding identification of the termination of the conus medullaris. It also largely obscures the cauda equina.   Paraspinal and other soft tissues: No abnormality identified within included portions of the abdomen/retroperitoneum. Postsurgical changes to the dorsal thoracic, lumbar and sacral paraspinal soft tissues.   Disc levels:   Multilevel disc degeneration, incompletely assessed.   At L1-L2, there is advanced disc degeneration. Mild disc bulging and endplate spurring. The spinal canal is fairly well visualized. No apparent spinal canal stenosis at this level. The neural foramina are obscured by susceptibility artifact.   At  L4-L5, there is moderate/advanced disc degeneration with possible fusion across the disc space. Small disc bulge and mild endplate spurring. The spinal canal is well visualized. No significant spinal canal stenosis. No appreciable foraminal stenosis.   At L5-S1, there is fusion across the disc space. Endplate spurring. The spinal canal is fairly well visualized. No appreciable spinal canal stenosis at this level. No significant neural foraminal narrowing.   Susceptibility artifact precludes adequate evaluation at the remaining levels.   IMPRESSION: Despite the use of metal artifact reduction sequences, there is severe susceptibility artifact arising from a posterior spinal fusion construct extending from the thoracic spine to the level of the sacrum. This significantly obscures the vertebrae, spinal canal and neural foramina, and significantly limits evaluation. It also significantly limits evaluation for marrow edema/osseous lesions.   At L1-L2, there is advanced disc degeneration on the right. Mild disc bulging and endplate spurring. The spinal canal is fairly well visualized. No apparent spinal canal stenosis at this level. The neural foramina are obscured by susceptibility artifact.   At L4-L5, there is moderate/advanced disc degeneration with possible fusion across the disc space. Small disc bulge and mild endplate spurring. The spinal canal is well visualized. No significant spinal canal stenosis. The neural foramina are partially obscured by susceptibility artifact. No appreciable foraminal stenosis.   At L5-S1, there is fusion across the disc space. Endplate spurring. The spinal canal is fairly well visualized. No appreciable spinal canal stenosis at this level. No significant neural foraminal narrowing.   Susceptibility artifact precludes adequate evaluation at the remaining levels.   Lumbar levocurvature.     Electronically Signed By: Rockey Childs DO On:  09/30/2020 12:32 Note: Reviewed        Physical Exam  Vitals: BP 123/76 (BP Location: Right Arm, Patient Position: Sitting, Cuff Size: Normal)   Pulse 73   Temp (!) 97.4 F (36.3 C) (Temporal)   Resp 16   Ht 5' 4 (1.626 m)   Wt 134 lb (60.8 kg)   LMP 09/13/2000   SpO2 99%   BMI 23.00 kg/m  BMI: Estimated body mass index is 23 kg/m as calculated from the following:   Height as of this encounter: 5' 4 (1.626 m).   Weight as of this encounter: 134  lb (60.8 kg). Ideal: Ideal body weight: 54.7 kg (120 lb 9.5 oz) Adjusted ideal body weight: 57.1 kg (125 lb 15.3 oz) General appearance: Well nourished, well developed, and well hydrated. In no apparent acute distress Mental status: Alert, oriented x 3 (person, place, & time)       Respiratory: No evidence of acute respiratory distress Eyes: PERLA Lumbar Spine Area Exam  Skin & Axial Inspection: Thoraco-lumbar Scoliosis, well-healed scar from surgery Alignment: Asymmetric Functional ROM: Pain restricted ROM       Stability: No instability detected Muscle Tone/Strength: Increased muscle tone over affected area Sensory (Neurological): Musculoskeletal pain pattern Palpation: Complains of area being tender to palpation         Physical Examination Findings: Positive Sacral Thrust (Sacral Spring, Downward Pressure): (Y) Positive FABER maneuver (Patrick's): (Y) Positive SI distraction (Gapping): (Y) Positive SI compression (Approximation): (Y) Positive Thigh Thrust:  (Y) Positive Gaenslen's: (Y) Positive Sacral Sulcus Tenderness: (Y)    Gait & Posture Assessment  Ambulation: Patient ambulates using a cane Gait: Antalgic gait (limping) Posture: Difficulty standing up straight, due to pain  Lower Extremity Exam      Side: Right lower extremity   Side: Left lower extremity  Stability: No instability observed           Stability: No instability observed          Skin & Extremity Inspection: Skin color, temperature, and hair growth  are WNL. No peripheral edema or cyanosis. No masses, redness, swelling, asymmetry, or associated skin lesions. No contractures.   Skin & Extremity Inspection: Skin color, temperature, and hair growth are WNL. No peripheral edema or cyanosis. No masses, redness, swelling, asymmetry, or associated skin lesions. No contractures.  Functional ROM: Pain restricted ROM for all joints of the lower extremity           Functional ROM: Pain restricted ROM for hip and knee joints          Muscle Tone/Strength: Functionally intact. No obvious neuro-muscular anomalies detected.   Muscle Tone/Strength: Functionally intact. No obvious neuro-muscular anomalies detected.  Sensory (Neurological): Neurogenic pain pattern         Sensory (Neurological): Neurogenic pain pattern        DTR: Patellar: deferred today Achilles: deferred today Plantar: deferred today   DTR: Patellar: deferred today Achilles: deferred today Plantar: deferred today  Palpation: No palpable anomalies   Palpation: No palpable anomalies      Assessment   Diagnosis Status  1. Sacroiliac joint pain   2. SI joint arthritis   3. Cluneal neuropathy   4. History of lumbar fusion   5. History of lumbar spinal fusion   6. Chronic pain syndrome   7. Piriformis syndrome of both sides    Having a Flare-up Having a Flare-up Having a Flare-up   Updated Problems: No problems updated.  Plan of Care  Problem-specific:  Assessment and Plan    Chronic lumbar post-surgical pain   She experiences chronic lumbar post-surgical pain with burning and tingling in the feet, and more problematic numbness. Previous nerve blocks targeting cluneal nerves were ineffective, indicating cluneal neuropathy is not a major pain contributor. A caudal injection is under consideration to address potential nerve inflammation in the fused lumbar spine, which may alleviate leg pain.  Sacroiliac joint pain   Sacroiliac joint pain is likely due to arthritis and  degeneration, common in patients with extensive spinal fusions. Pain may originate from the SI joint or the piriformis muscle, which can  compress the sciatic nerve and cause radiating leg pain. An SI joint injection with a piriformis muscle injection is planned to assess and treat the pain. The procedure is scheduled for April 12, 2024, using a local anesthetic.       Ms. Camillia C Genson has a current medication list which includes the following long-term medication(s): atorvastatin , calcium -vitamin d -vitamin k, duloxetine , gabapentin, and metoprolol  succinate.  Pharmacotherapy (Medications Ordered): No orders of the defined types were placed in this encounter.  Orders:  Orders Placed This Encounter  Procedures   TRIGGER POINT INJECTION    Area: Buttocks region (gluteal area) Indications: Piriformis muscle pain;  TPI piriformis-syndrome; piriformis muscle spasms (F37.161). CPT code: 79447    Scheduling Instructions:     Piriformis tpi    Where will this procedure be performed?:   [facility]   SACROILIAC JOINT INJECTION    Physical Examination Findings: Positive Sacral Thrust (Sacral Spring, Downward Pressure): (Y) Positive FABER maneuver (Patrick's): (Y) Positive SI distraction (Gapping): (Y) Positive SI compression (Approximation): (Y) Positive Thigh Thrust:  (Y) Positive Gaenslen's: (Y) Positive Sacral Sulcus Tenderness: (Y)    Standing Status:   Future    Expected Date:   04/12/2024    Expiration Date:   03/23/2025    Scheduling Instructions:     Procedure: Sacroiliac Joint Injection     Side  Laterality: Bilateral     Sedation: local     Timeframe: As soon as schedule allows.    Where will this procedure be performed?:   [facility]     Bilateral cluneal nerve block 02/23/2024: Not helpful   Return in about 20 days (around 04/12/2024) for B/L SIJ + Piriformis , in clinic [facility].    Recent Visits Date Type Provider Dept  02/23/24 Procedure visit  Marcelino Nurse, MD [facility]  01/06/24 Office Visit Marcelino Nurse, MD [facility]  Showing recent visits within past 90 days and meeting all other requirements Today's Visits Date Type Provider Dept  03/23/24 Office Visit Marcelino Nurse, MD [facility]  Showing today's visits and meeting all other requirements Future Appointments Date Type Provider Dept  04/12/24 Appointment Marcelino Nurse, MD [facility]  Showing future appointments within next 90 days and meeting all other requirements  I discussed the assessment and treatment plan with the patient. The patient was provided an opportunity to ask questions and all were answered. The patient agreed with the plan and demonstrated an understanding of the instructions.  Patient advised to call back or seek an in-person evaluation if the symptoms or condition worsens.  I personally spent a total of 30 minutes in the care of the patient today including preparing to see the patient, getting/reviewing separately obtained history, performing a medically appropriate exam/evaluation, counseling and educating, placing orders, and documenting clinical information in the EHR.   Note by: Nurse Marcelino, MD (TTS and AI technology used. I apologize for any typographical errors that were not detected and corrected.) Date: 03/23/2024; Time: 11:57 AM

## 2024-03-24 DIAGNOSIS — M810 Age-related osteoporosis without current pathological fracture: Secondary | ICD-10-CM | POA: Diagnosis not present

## 2024-03-24 LAB — HM DEXA SCAN

## 2024-03-27 ENCOUNTER — Encounter: Payer: Self-pay | Admitting: Family Medicine

## 2024-04-04 ENCOUNTER — Ambulatory Visit: Payer: Self-pay | Admitting: Family Medicine

## 2024-04-04 ENCOUNTER — Other Ambulatory Visit

## 2024-04-04 DIAGNOSIS — E1122 Type 2 diabetes mellitus with diabetic chronic kidney disease: Secondary | ICD-10-CM | POA: Diagnosis not present

## 2024-04-04 DIAGNOSIS — N1831 Chronic kidney disease, stage 3a: Secondary | ICD-10-CM | POA: Diagnosis not present

## 2024-04-04 LAB — COMPREHENSIVE METABOLIC PANEL WITH GFR
ALT: 16 U/L (ref 0–35)
AST: 24 U/L (ref 0–37)
Albumin: 4.1 g/dL (ref 3.5–5.2)
Alkaline Phosphatase: 60 U/L (ref 39–117)
BUN: 18 mg/dL (ref 6–23)
CO2: 33 meq/L — ABNORMAL HIGH (ref 19–32)
Calcium: 8.8 mg/dL (ref 8.4–10.5)
Chloride: 100 meq/L (ref 96–112)
Creatinine, Ser: 0.65 mg/dL (ref 0.40–1.20)
GFR: 84.84 mL/min (ref >60.00)
Glucose, Bld: 94 mg/dL (ref 70–99)
Potassium: 4.2 meq/L (ref 3.5–5.1)
Sodium: 139 meq/L (ref 135–145)
Total Bilirubin: 0.5 mg/dL (ref 0.2–1.2)
Total Protein: 6.9 g/dL (ref 6.0–8.3)

## 2024-04-04 LAB — LIPID PANEL
Cholesterol: 142 mg/dL (ref 0–200)
HDL: 61 mg/dL (ref >39.00)
LDL Cholesterol: 68 mg/dL (ref 0–99)
NonHDL: 80.58
Total CHOL/HDL Ratio: 2
Triglycerides: 61 mg/dL (ref 0.0–149.0)
VLDL: 12.2 mg/dL (ref 0.0–40.0)

## 2024-04-04 LAB — MICROALBUMIN / CREATININE URINE RATIO
Creatinine,U: 30.8 mg/dL
Microalb Creat Ratio: UNDETERMINED mg/g (ref 0.0–30.0)
Microalb, Ur: <0.7 mg/dL

## 2024-04-04 LAB — HEMOGLOBIN A1C: Hgb A1c MFr Bld: 6.2 % (ref 4.6–6.5)

## 2024-04-04 NOTE — Progress Notes (Signed)
 No critical labs need to be addressed urgently. We will discuss labs in detail at upcoming office visit.

## 2024-04-06 ENCOUNTER — Other Ambulatory Visit: Payer: Self-pay | Admitting: Family Medicine

## 2024-04-11 ENCOUNTER — Ambulatory Visit: Admitting: Family Medicine

## 2024-04-11 ENCOUNTER — Encounter: Payer: Self-pay | Admitting: Family Medicine

## 2024-04-11 VITALS — BP 130/80 | HR 84 | Temp 98.7°F | Ht 64.0 in | Wt 136.1 lb

## 2024-04-11 DIAGNOSIS — Z0000 Encounter for general adult medical examination without abnormal findings: Secondary | ICD-10-CM

## 2024-04-11 DIAGNOSIS — E785 Hyperlipidemia, unspecified: Secondary | ICD-10-CM

## 2024-04-11 DIAGNOSIS — G588 Other specified mononeuropathies: Secondary | ICD-10-CM | POA: Diagnosis not present

## 2024-04-11 DIAGNOSIS — N1831 Chronic kidney disease, stage 3a: Secondary | ICD-10-CM

## 2024-04-11 DIAGNOSIS — M810 Age-related osteoporosis without current pathological fracture: Secondary | ICD-10-CM | POA: Diagnosis not present

## 2024-04-11 DIAGNOSIS — E1122 Type 2 diabetes mellitus with diabetic chronic kidney disease: Secondary | ICD-10-CM | POA: Diagnosis not present

## 2024-04-11 DIAGNOSIS — G4733 Obstructive sleep apnea (adult) (pediatric): Secondary | ICD-10-CM | POA: Diagnosis not present

## 2024-04-11 DIAGNOSIS — C439 Malignant melanoma of skin, unspecified: Secondary | ICD-10-CM

## 2024-04-11 DIAGNOSIS — E1169 Type 2 diabetes mellitus with other specified complication: Secondary | ICD-10-CM | POA: Diagnosis not present

## 2024-04-11 DIAGNOSIS — M25361 Other instability, right knee: Secondary | ICD-10-CM

## 2024-04-11 DIAGNOSIS — F331 Major depressive disorder, recurrent, moderate: Secondary | ICD-10-CM

## 2024-04-11 LAB — HM DIABETES FOOT EXAM

## 2024-04-11 MED ORDER — ALENDRONATE SODIUM 70 MG PO TABS: 70.0000 mg | ORAL_TABLET | ORAL | 3 refills | Status: AC

## 2024-04-11 NOTE — Assessment & Plan Note (Signed)
 Chronic, stable on  Cymbalta  60 mg daily

## 2024-04-11 NOTE — Assessment & Plan Note (Signed)
 Uses CPAP

## 2024-04-11 NOTE — Assessment & Plan Note (Signed)
LDL  at goal less than 100 on atorvastatin 10 mg daily.

## 2024-04-11 NOTE — Assessment & Plan Note (Signed)
 Chronic, if he cannot improvement in patient GFR now at 84.

## 2024-04-11 NOTE — Progress Notes (Signed)
 Patient ID: Dana Massey, female    DOB: [birth year], 78 y.o.   MRN: [id number]  This visit was conducted in person.  BP (!) 120/90   Pulse 84   Temp 98.7 F (37.1 C) (Temporal)   Ht 5' 4 (1.626 m)   Wt 136 lb 2 oz (61.7 kg)   LMP 09/13/2000   SpO2 96%   BMI 23.37 kg/m    CC:  Chief Complaint  Patient presents with   Annual Exam    Part 2 MWV 01/13/2024    Subjective:   HPI: Dana Massey is a 78 y.o. female presenting on 04/11/2024 for Annual Exam (Part 2/MWV 01/13/2024) The patient presents for  complete physical and review of chronic health problems. He/She also has the following acute concerns today:   The patient saw a LPN or RN for medicare wellness visit. 12/14/2023  Prevention and wellness was reviewed in detail. Note reviewed and important notes copied below.    Chronic low back pain, SI joint pain... followed by [facility]. Treating with caudal nerve block. Tomorrow having  trigger point injections for piriformis syndrome.  Tried  buprinoirphine patches  for pain control... stopped given did not help. ,: History of melena when followed by dermatology [facility]  Osteoporosis: On vitamin D  and calcium   Major depressive disorder: Well-controlled on cymbalta  60 mg . daily Flowsheet Row Office Visit from 03/23/2024 in [facility] Total Score 0     Hyperlipidemia: LDL at goal  < 100 on atorvastatin  10 mg p.o. daily at last check Lab Results  Component Value Date   CHOL 142 04/04/2024   HDL 61.00 04/04/2024   LDLCALC 68 04/04/2024   LDLDIRECT 126.9 11/25/2012   TRIG 61.0 04/04/2024   CHOLHDL 2 04/04/2024   Diabetes:  Lab Results  Component Value Date   HGBA1C 6.2 04/04/2024  Using medications without difficulties: Hypoglycemic episodes: Hyperglycemic episodes: Feet problems: Blood Sugars averaging: eye exam within last year: Due Microalbumin:  01/05/2023 Foot exam DUE  Wt Readings from Last 3 Encounters:  04/11/24 136 lb 2 oz (61.7 kg)  03/23/24 134 lb (60.8 kg)  02/28/24 137 lb (62.1 kg)  ]  OSA:  Stable using CPAP      Relevant past medical, surgical, family and social history reviewed and updated as indicated. Interim medical history since our last visit reviewed. Allergies and medications reviewed and updated. Outpatient Medications Prior to Visit  Medication Sig Dispense Refill   Acetaminophen 500 MG coapsule Take 50 mg by mouth every 6 (six) hours as needed.     aspirin EC 81 MG tablet Take 81 mg by mouth daily.     atorvastatin  (LIPITOR) 10 MG tablet TAKE 1 TABLET EVERY DAY 90 tablet 3   Calcium -Vitamin D -Vitamin K 500-100-40 MG-UNT-MCG CHEW Chew 1 capsule by mouth daily.       dorzolamide-timolol (COSOPT) 22.3-6.8 MG/ML ophthalmic solution Place 1 drop into both eyes in the morning and at bedtime.     DULoxetine  (CYMBALTA ) 60 MG capsule TAKE 1 CAPSULE EVERY DAY 90 capsule 0   fish oil-omega-3 fatty acids 1000 MG capsule Take 2 g by mouth daily.       gabapentin (NEURONTIN) 400 MG capsule Take 400 mg by mouth 2 (two) times daily.     latanoprost (XALATAN) 0.005 % ophthalmic solution Place 1 drop into both eyes at bedtime.     Meclizine  HCl 25 MG CHEW  Chew 1-2 tablets by mouth daily as needed (Veritgo).     metoprolol  succinate (TOPROL -XL) 25 MG 24 hr tablet TAKE 1 TABLET DAILY WITH OR IMMEDIATELY FOLLOWING A MEAL. PLEASE SCHEDULE OFFICE VISIT FOR FUTURE REFILLS 90 tablet 3   Multiple Vitamins-Minerals (MULTIVITAMIN WITH MINERALS) tablet Take 1 tablet by mouth daily.       polyethylene glycol powder (GLYCOLAX/MIRALAX) 17 GM/SCOOP powder Take 17 g by mouth daily as needed.      buprenorphine  (BUTRANS ) 7.5 MCG/HR Place 1 patch onto the skin once a week. 4 patch 2   meloxicam  (MOBIC ) 15 MG tablet TAKE 1 TABLET EVERY DAY 90 tablet 3   No facility-administered medications prior to visit.     Per HPI unless specifically  indicated in ROS section below Review of Systems  Constitutional:  Negative for fatigue and fever.  HENT:  Negative for congestion.   Eyes:  Negative for pain.  Respiratory:  Negative for cough and shortness of breath.   Cardiovascular:  Negative for chest pain, palpitations and leg swelling.  Gastrointestinal:  Negative for abdominal pain.  Genitourinary:  Negative for dysuria and vaginal bleeding.  Musculoskeletal:  Positive for back pain.  Neurological:  Negative for syncope, light-headedness and headaches.  Psychiatric/Behavioral:  Negative for dysphoric mood.    Objective:  BP (!) 120/90   Pulse 84   Temp 98.7 F (37.1 C) (Temporal)   Ht 5' 4 (1.626 m)   Wt 136 lb 2 oz (61.7 kg)   LMP 09/13/2000   SpO2 96%   BMI 23.37 kg/m   Wt Readings from Last 3 Encounters:  04/11/24 136 lb 2 oz (61.7 kg)  03/23/24 134 lb (60.8 kg)  02/28/24 137 lb (62.1 kg)      Physical Exam Constitutional:      General: She is not in acute distress.    Appearance: Normal appearance. She is well-developed. She is not ill-appearing or toxic-appearing.  HENT:     Head: Normocephalic.     Right Ear: Hearing, tympanic membrane, ear canal and external ear normal. Tympanic membrane is not erythematous, retracted or bulging.     Left Ear: Hearing, tympanic membrane, ear canal and external ear normal. Tympanic membrane is not erythematous, retracted or bulging.     Nose: No mucosal edema or rhinorrhea.     Right Sinus: No maxillary sinus tenderness or frontal sinus tenderness.     Left Sinus: No maxillary sinus tenderness or frontal sinus tenderness.     Mouth/Throat:     Pharynx: Uvula midline.  Eyes:     General: Lids are normal. Lids are everted, no foreign bodies appreciated.     Conjunctiva/sclera: Conjunctivae normal.     Pupils: Pupils are equal, round, and reactive to light.  Neck:     Thyroid : No thyroid  mass or thyromegaly.     Vascular: No carotid bruit.     Trachea: Trachea normal.   Cardiovascular:     Rate and Rhythm: Normal rate and regular rhythm.     Pulses: Normal pulses.     Heart sounds: Normal heart sounds, S1 normal and S2 normal. No murmur heard.    No friction rub. No gallop.  Pulmonary:     Effort: Pulmonary effort is normal. No tachypnea or respiratory distress.     Breath sounds: Normal breath sounds. No decreased breath sounds, wheezing, rhonchi or rales.  Abdominal:     General: Bowel sounds are normal.     Palpations: Abdomen is soft.  Tenderness: There is no abdominal tenderness.  Musculoskeletal:     Cervical back: Normal, normal range of motion and neck supple.     Thoracic back: Normal.     Lumbar back: No tenderness or bony tenderness. Decreased range of motion. Positive right straight leg raise test and positive left straight leg raise test.     Comments:  Atrophy in right lower leg from chronic neuropathy. TTP over bilateral sciatic notch.. pain with Bilateral faber's  Skin:    General: Skin is warm and dry.     Findings: No rash.  Neurological:     Mental Status: She is alert.  Psychiatric:        Mood and Affect: Mood is not anxious or depressed.        Speech: Speech normal.        Behavior: Behavior normal. Behavior is cooperative.        Thought Content: Thought content normal.        Judgment: Judgment normal.      Diabetic foot exam: Normal inspection No skin breakdown No calluses  Normal DP pulses Normal sensation to light touch and monofilament on left foot, decrased sensation in left lower extremety. Nails normal    Results for orders placed or performed in visit on 04/11/24  [facility] DIABETES FOOT EXAM   Collection Time: 04/11/24 12:00 AM  Result Value Ref Range   [facility] Diabetic Foot Exam done     Assessment and Plan The patient's preventative maintenance and recommended screening tests for an annual wellness exam were reviewed in full today. Brought up to date unless services declined.  Counselled on the  importance of diet, exercise, and its role in overall health and mortality. The patient's FH and SH was reviewed, including their home life, tobacco status, and drug and alcohol status.   Vaccines: uptodate  shingrix , prevnar, had   flu shot and COVID-19 10/10 Pneumonia vaccine and shingles up-to-date Pap/DVE: Not indicated after age 9 Mammo: July 2025 normal Bone Density: 03/24/2024 worsened... now  osteoporosis,  rec start of fosamax.  Was on fosamax in 30s with osteopenia...  tolerated and willing to restart.. receheck DEXa in 1-2 years. Colon: March 16, 2023, no polyps, has diverticulosis, Dr. Abran,  no furtehr indicated  Smoking Status: None ETOH/ drug use: rare/none  Hep C: Done    Routine general medical examination at a health care facility  MDD (major depressive disorder), recurrent episode, moderate (HCC) Assessment & Plan: Chronic, stable on  Cymbalta  60 mg daily   Hyperlipidemia associated with type 2 diabetes mellitus (HCC) Assessment & Plan: LDL  at goal less than 100 on atorvastatin  10 mg daily.     Chronic kidney disease, stage 3a (HCC) Assessment & Plan: Chronic, if he cannot improvement in patient GFR now at 84.   Type 2 diabetes mellitus with stage 3a chronic kidney disease, without long-term current use of insulin (HCC) Assessment & Plan: Chronic, diet controlled.  Continued improvement  with recent lifestyle changes.   Melanoma of skin (HCC) Assessment & Plan: Followed by dermatology q6 month   Age-related osteoporosis without current pathological fracture Assessment & Plan:  New, recommend start of fosamax weekly Recommend weight bearing exercise, calcium  in diet and vit D supplement 400 IU 1-2 times daily.    Obstructive sleep apnea Assessment & Plan: Uses CPAP   Knee gives out, right -     Ambulatory referral to Orthopedic Surgery  Cluneal neuropathy Assessment & Plan:  Chronic... possible from back  or other issues. ( 1994  surgery  for scoliosis)  On gabapentin 400 mg at bedtime.  Followed by Dr Maree and his PA.    Other orders -     Alendronate Sodium; Take 1 tablet (70 mg total) by mouth every 7 (seven) days. Take with a full glass of water on an empty stomach.  Dispense: 12 tablet; Refill: 3      Return in about 6 months (around 10/10/2024) for diabetes follow up with POC A1C.   Greig Ring, MD

## 2024-04-11 NOTE — Assessment & Plan Note (Signed)
 Chronic... possible from back or other issues. ( 1994  surgery for scoliosis)  On gabapentin 400 mg at bedtime.  Followed by Dr Maree and his PA.

## 2024-04-11 NOTE — Assessment & Plan Note (Signed)
Followed by dermatology q6 month

## 2024-04-11 NOTE — Assessment & Plan Note (Signed)
 New, recommend start of fosamax weekly Recommend weight bearing exercise, calcium  in diet and vit D supplement 400 IU 1-2 times daily.

## 2024-04-11 NOTE — Assessment & Plan Note (Addendum)
 Chronic, diet controlled.  Continued improvement  with recent lifestyle changes.

## 2024-04-12 ENCOUNTER — Ambulatory Visit (HOSPITAL_BASED_OUTPATIENT_CLINIC_OR_DEPARTMENT_OTHER): Admitting: Student in an Organized Health Care Education/Training Program

## 2024-04-12 ENCOUNTER — Encounter: Payer: Self-pay | Admitting: Student in an Organized Health Care Education/Training Program

## 2024-04-12 ENCOUNTER — Ambulatory Visit
Admission: RE | Admit: 2024-04-12 | Discharge: 2024-04-12 | Disposition: A | Discharge status: Home / Self Care | Source: Ambulatory Visit | Attending: Student in an Organized Health Care Education/Training Program | Admitting: Student in an Organized Health Care Education/Training Program

## 2024-04-12 VITALS — BP 131/86 | HR 80 | Temp 97.9°F | Resp 18 | Ht 64.0 in | Wt 132.0 lb

## 2024-04-12 DIAGNOSIS — M533 Sacrococcygeal disorders, not elsewhere classified: Secondary | ICD-10-CM

## 2024-04-12 DIAGNOSIS — M461 Sacroiliitis, not elsewhere classified: Secondary | ICD-10-CM | POA: Diagnosis not present

## 2024-04-12 DIAGNOSIS — G5703 Lesion of sciatic nerve, bilateral lower limbs: Secondary | ICD-10-CM

## 2024-04-12 DIAGNOSIS — M47818 Spondylosis without myelopathy or radiculopathy, sacral and sacrococcygeal region: Secondary | ICD-10-CM | POA: Diagnosis not present

## 2024-04-12 MED ORDER — LIDOCAINE HCL 2 % IJ SOLN
20.0000 mL | Freq: Once | INTRAMUSCULAR | Status: AC
  Administered 2024-04-12: 200 mg
  Filled 2024-04-12: qty 40

## 2024-04-12 MED ORDER — METHYLPREDNISOLONE ACETATE 80 MG/ML IJ SUSP
80.0000 mg | Freq: Once | INTRAMUSCULAR | Status: AC
  Administered 2024-04-12: 80 mg via INTRA_ARTICULAR
  Filled 2024-04-12: qty 1

## 2024-04-12 MED ORDER — DEXAMETHASONE SOD PHOSPHATE PF 10 MG/ML IJ SOLN
10.0000 mg | Freq: Once | INTRAMUSCULAR | Status: AC
  Administered 2024-04-12: 10 mg

## 2024-04-12 MED ORDER — ROPIVACAINE HCL 2 MG/ML IJ SOLN
INTRAMUSCULAR | Status: AC
  Filled 2024-04-12: qty 20

## 2024-04-12 MED ORDER — IOHEXOL 180 MG/ML  SOLN
10.0000 mL | Freq: Once | INTRAMUSCULAR | Status: AC
  Administered 2024-04-12: 10 mL via INTRA_ARTICULAR
  Filled 2024-04-12: qty 20

## 2024-04-12 NOTE — Patient Instructions (Signed)

## 2024-04-12 NOTE — Progress Notes (Signed)
 Safety precautions to be maintained throughout the outpatient stay will include: orient to surroundings, keep bed in low position, maintain call bell within reach at all times, provide assistance with transfer out of bed and ambulation.

## 2024-04-12 NOTE — Progress Notes (Signed)
 PROVIDER NOTE: Interpretation of information contained herein should be left to medically-trained personnel. Specific patient instructions are provided elsewhere under Patient Instructions section of medical record. This document was created in part using STT-dictation technology, any transcriptional errors that may result from this process are unintentional.  Patient: Dana Massey Type: Established DOB: 1945-07-20 MRN: [id number] PCP: Avelina Greig BRAVO, MD  Service: Procedure DOS: 04/12/2024 Setting: Ambulatory Location: Ambulatory outpatient facility Delivery: Face-to-face Provider: Wallie Sherry, MD Specialty: Interventional Pain Management Specialty designation: 09 Location: Outpatient facility Ref. Prov.: Avelina Greig BRAVO, MD       Interventional Therapy   Procedure: Sacroiliac Joint Steroid Injection #1   and bilateral piriformis TPI #1 Laterality: Bilateral     Level: PIIS (Posterior Inferior Iliac Spine)  Target: Interarticular sacroiliac joint. Location: Medial to the postero-medial edge of iliac spine. Region: Lumbosacral-sacrococcygeal. Approach: Inferior postero-medial percutaneous approach. Type of procedure: Percutaneous joint injection.  Imaging: Fluoroscopy-guided Non-spinal ([id number]) Anesthesia: Local anesthesia (1-2% Lidocaine ) DOS: 04/12/2024  Performed by: Wallie Sherry, MD  Purpose: Diagnostic/Therapeutic Indications: Sacroiliac joint pain in the lower back and hip area severe enough to impact quality of life or function. Rationale (medical necessity): procedure needed and proper for the diagnosis and/or treatment of Dana Massey's medical symptoms and needs. 1. Sacroiliac joint pain   2. SI joint arthritis   3. Piriformis syndrome of both sides    NAS-11 Pain score:   Pre-procedure: 5 /10   Post-procedure: 0-No pain/10      Position / Prep / Materials:  Position: Prone  Prep solution: ChloraPrep (2% chlorhexidine gluconate and 70% isopropyl  alcohol) Prep Area: Entire posterior lumbosacral area  Materials:  Tray: Block Needle(s):  Type: Spinal  Gauge (G): 22  Length: 3.5-in Qty: One (1) per procedure side.  H&P (Pre-op Assessment):  Dana Massey is a 78 y.o. (year old), female patient, seen today for interventional treatment. She  has a past surgical history that includes Back surgery (1994); Rotator cuff repair (2006); Cataract extraction (Left, 04/2010); Wisdom tooth extraction; Colonoscopy (2009); Eye surgery (05/05/2010,  02/05/2020); and Spine surgery (09/09/1992). Dana Massey has a current medication list which includes the following prescription(s): acetaminophen, alendronate, aspirin ec, atorvastatin , calcium -vitamin d -vitamin k, dorzolamide-timolol, duloxetine , fish oil-omega-3 fatty acids, gabapentin, latanoprost, meclizine  hcl, metoprolol  succinate, multivitamin with minerals, and polyethylene glycol powder. Her primarily concern today is the Back Pain (low) and Hip Pain (B/L, left is worse)  Initial Vital Signs:  Pulse/HCG Rate: 80ECG Heart Rate: 75 Temp: 97.9 F (36.6 C) Resp: 16 BP: 121/81 SpO2: 95 %  BMI: Estimated body mass index is 22.66 kg/m as calculated from the following:   Height as of this encounter: 5' 4 (1.626 m).   Weight as of this encounter: 132 lb (59.9 kg).  Risk Assessment: Allergies: Reviewed. She is allergic to wasp venom.  Allergy Precautions: None required Coagulopathies: Reviewed. None identified.  Blood-thinner therapy: None at this time Active Infection(s): Reviewed. None identified. Dana Massey is afebrile  Site Confirmation: Dana Massey was asked to confirm the procedure and laterality before marking the site Procedure checklist: Completed Consent: Before the procedure and under the influence of no sedative(s), amnesic(s), or anxiolytics, the patient was informed of the treatment options, risks and possible complications. To fulfill our ethical and legal obligations, as  recommended by the American Medical Association's Code of Ethics, I have informed the patient of my clinical impression; the nature and purpose of the treatment or procedure; the risks, benefits, and possible complications of the intervention; the  alternatives, including doing nothing; the risk(s) and benefit(s) of the alternative treatment(s) or procedure(s); and the risk(s) and benefit(s) of doing nothing. The patient was provided information about the general risks and possible complications associated with the procedure. These may include, but are not limited to: failure to achieve desired goals, infection, bleeding, organ or nerve damage, allergic reactions, paralysis, and death. In addition, the patient was informed of those risks and complications associated to the procedure, such as failure to decrease pain; infection; bleeding; organ or nerve damage with subsequent damage to sensory, motor, and/or autonomic systems, resulting in permanent pain, numbness, and/or weakness of one or several areas of the body; allergic reactions; (i.e.: anaphylactic reaction); and/or death. Furthermore, the patient was informed of those risks and complications associated with the medications. These include, but are not limited to: allergic reactions (i.e.: anaphylactic or anaphylactoid reaction(s)); adrenal axis suppression; blood sugar elevation that in diabetics may result in ketoacidosis or comma; water retention that in patients with history of congestive heart failure may result in shortness of breath, pulmonary edema, and decompensation with resultant heart failure; weight gain; swelling or edema; medication-induced neural toxicity; particulate matter embolism and blood vessel occlusion with resultant organ, and/or nervous system infarction; and/or aseptic necrosis of one or more joints. Finally, the patient was informed that Medicine is not an exact science; therefore, there is also the possibility of unforeseen or  unpredictable risks and/or possible complications that may result in a catastrophic outcome. The patient indicated having understood very clearly. We have given the patient no guarantees and we have made no promises. Enough time was given to the patient to ask questions, all of which were answered to the patient's satisfaction. Dana Massey has indicated that she wanted to continue with the procedure. Attestation: I, the ordering provider, attest that I have discussed with the patient the benefits, risks, side-effects, alternatives, likelihood of achieving goals, and potential problems during recovery for the procedure that I have provided informed consent. Date  Time: 04/12/2024  1:01 PM  Pre-Procedure Preparation:  Monitoring: As per clinic protocol. Respiration, ETCO2, SpO2, BP, heart rate and rhythm monitor placed and checked for adequate function Safety Precautions: Patient was assessed for positional comfort and pressure points before starting the procedure. Time-out: I initiated and conducted the Time-out before starting the procedure, as per protocol. The patient was asked to participate by confirming the accuracy of the Time Out information. Verification of the correct person, site, and procedure were performed and confirmed by me, the nursing staff, and the patient. Time-out conducted as per Joint Commission's Universal Protocol (UP.01.01.01). Time: 1444 Start Time: 1444 hrs.  Description/Narrative of Procedure:          Start Time: 1444 hrs.  Rationale (medical necessity): procedure needed and proper for the diagnosis and/or treatment of the patient's medical symptoms and needs. Procedural Technique Safety Precautions: Aspiration looking for blood return was conducted prior to all injections. At no point did we inject any substances, as a needle was being advanced. No attempts were made at seeking any paresthesias. Safe injection practices and needle disposal techniques used.  Medications properly checked for expiration dates. SDV (single dose vial) medications used. Description of the Procedure: Protocol guidelines were followed. The patient was assisted into a comfortable position. The target area was identified and the area prepped in the usual manner. Skin & deeper tissues infiltrated with local anesthetic. Appropriate amount of time allowed to pass for local anesthetics to take effect. The procedure needles were then advanced  to the target area. Proper needle placement secured. Negative aspiration confirmed. Solution injected in intermittent fashion, asking for systemic symptoms every 0.5cc of injectate. The needles were then removed and the area cleansed, making sure to leave some of the prepping solution back to take advantage of its long term bactericidal properties.  Technical description of procedure:  Fluoroscopy using a posterior anterior 45 degree angle from the midline aiming at the anterolateral aspect of the patient was used to find a direct path into the sacroiliac joint, the superior medial to posterior superior iliac spine.  The skin was marked where the desired target and the skin infiltrated with local anesthetics.  The procedure needle was then advanced until the joint was entered.  Once inside of the joint, we then proceeded to inject the desired solution.  10 cc solution made of 9 cc of 0.2% ropivacaine , 1 cc of methylprednisolone, 80 mg/cc.  5 cc injected into the left SI joint, 5 cc injected into the right SI joint.   Afterwards a right & left piriformis trigger point injection was done 1 cm inferior, 1 cm deep, 1 cm lateral to the inferior fissure of the SI joint.  Contrast was injected to confirm piriformis muscle striation.  While injecting, patient did not complain of any pain radiating down her leg.  10 cc solution consisting of 9 cc of 0.2% ropivacaine , 1 cc of Decadron  10 mg/cc.  5 cc injected into the left piriformis, 5 cc injected into the right  piriformis.     Vitals:   04/12/24 1440 04/12/24 1442 04/12/24 1446 04/12/24 1450  BP: (!) 140/88 131/83 136/84 131/86  Pulse:      Resp: 15 18 17 18   Temp:      SpO2: 97% 96% 96% 95%  Weight:      Height:         End Time: 1450 hrs.  Imaging Guidance (Non-Spinal):          Type of Imaging Technique: Fluoroscopy Guidance (Non-Spinal) Indication(s): Fluoroscopy guidance for needle placement to enhance accuracy in procedures requiring precise needle localization for targeted delivery of medication in or near specific anatomical locations not easily accessible without such real-time imaging assistance. Exposure Time: Please see nurses notes. Contrast: None used. Fluoroscopic Guidance: I was personally present during the use of fluoroscopy. Tunnel Vision Technique used to obtain the best possible view of the target area. Parallax error corrected before commencing the procedure. Direction-depth-direction technique used to introduce the needle under continuous pulsed fluoroscopy. Once target was reached, antero-posterior, oblique, and lateral fluoroscopic projection used confirm needle placement in all planes. Images permanently stored in EMR. Interpretation: No contrast injected. I personally interpreted the imaging intraoperatively. Adequate needle placement confirmed in multiple planes. Permanent images saved into the patient's record.  Post-operative Assessment:  Post-procedure Vital Signs:  Pulse/HCG Rate: 8074 Temp: 97.9 F (36.6 C) Resp: 18 BP: 131/86 SpO2: 95 %  EBL: None  Complications: No immediate post-treatment complications observed by team, or reported by patient.  Note: The patient tolerated the entire procedure well. A repeat set of vitals were taken after the procedure and the patient was kept under observation following institutional policy, for this type of procedure. Post-procedural neurological assessment was performed, showing return to baseline, prior to  discharge. The patient was provided with post-procedure discharge instructions, including a section on how to identify potential problems. Should any problems arise concerning this procedure, the patient was given instructions to immediately contact us , at any time, without hesitation.  In any case, we plan to contact the patient by telephone for a follow-up status report regarding this interventional procedure.  Comments:  No additional relevant information.  Plan of Care (POC)  Orders:  Orders Placed This Encounter  Procedures   DG PAIN CLINIC C-ARM 1-60 MIN NO REPORT    Intraoperative interpretation by procedural physician at [facility].    Standing Status:   Standing    Number of Occurrences:   1    Reason for exam::   Assistance in needle guidance and placement for procedures requiring needle placement in or near specific anatomical locations not easily accessible without such assistance.    Medications ordered for procedure: Meds ordered this encounter  Medications   iohexol  (OMNIPAQUE ) 180 MG/ML injection 10 mL    Must be Myelogram-compatible. If not available, you may substitute with a water-soluble, non-ionic, hypoallergenic, myelogram-compatible radiological contrast medium.   lidocaine  (XYLOCAINE ) 2 % (with pres) injection 400 mg   methylPREDNISolone acetate (DEPO-MEDROL) injection 80 mg   dexamethasone  (DECADRON ) injection 10 mg   Medications administered: We administered iohexol , lidocaine , methylPREDNISolone acetate, and dexamethasone .  See the medical record for exact dosing, route, and time of administration.    Bilateral cluneal nerve block 02/23/2024: Not helpful Bilateral SI joint injection, bilateral piriformis TPI 04/12/2024    Follow-up plan:   Return in about 5 weeks (around 05/17/2024) for PPE, F2F, Seema.     Recent Visits Date Type Provider Dept  03/23/24 Office Visit Marcelino Nurse, MD [facility]  02/23/24 Procedure visit Marcelino Nurse, MD [facility]  Showing recent visits within past 90 days and meeting all other requirements Today's Visits Date Type Provider Dept  04/12/24 Procedure visit Marcelino Nurse, MD [facility]  Showing today's visits and meeting all other requirements Future Appointments Date Type Provider Dept  05/15/24 Appointment Patel, Seema K, NP [facility]  Showing future appointments within next 90 days and meeting all other requirements   Disposition: Discharge home  Discharge (Date  Time): 04/12/2024; 1502 hrs.   Primary Care Physician: Avelina Greig BRAVO, MD Location: [facility] Note by: Nurse Marcelino, MD (TTS technology used. I apologize for any typographical errors that were not detected and corrected.) Date: 04/12/2024; Time: 3:52 PM  Disclaimer:  Medicine is not an visual merchandiser. The only guarantee in medicine is that nothing is guaranteed. It is important to note that the decision to proceed with this intervention was based on the information collected from the patient. The Data and conclusions were drawn from the patient's questionnaire, the interview, and the physical examination. Because the information was provided in large part by the patient, it cannot be guaranteed that it has not been purposely or unconsciously manipulated. Every effort has been made to obtain as much relevant data as possible for this evaluation. It is important to note that the conclusions that lead to this procedure are derived in large part from the available data. Always take into account that the treatment will also be dependent on availability of resources and existing treatment guidelines, considered by other Pain Management Practitioners as being common knowledge and practice, at the time of the intervention. For Medico-Legal purposes, it is also important to point out that variation in procedural techniques and pharmacological choices are the acceptable  norm. The indications, contraindications, technique, and results of the above procedure should only be interpreted and judged by a Board-Certified Interventional Pain Specialist with extensive familiarity and expertise in the  same exact procedure and technique.

## 2024-04-13 ENCOUNTER — Telehealth: Payer: Self-pay | Admitting: *Deleted

## 2024-04-13 NOTE — Telephone Encounter (Signed)
 Post procedure call; voicemail left

## 2024-04-15 ENCOUNTER — Other Ambulatory Visit: Payer: Self-pay | Admitting: Internal Medicine

## 2024-04-17 ENCOUNTER — Encounter: Payer: Self-pay | Admitting: Radiology

## 2024-05-13 NOTE — Patient Instructions (Signed)
 ______________________________________________________________________    Opioid Pain Medication Update  To: All patients taking opioid pain medications. (I.e.: hydrocodone, hydromorphone, oxycodone, oxymorphone, morphine, codeine, methadone, tapentadol, tramadol, buprenorphine, fentanyl, etc.)  Re: Updated review of side effects and adverse reactions of opioid analgesics, as well as new information about long term effects of this class of medications.  Direct risks of long-term opioid therapy are not limited to opioid addiction and overdose. Potential medical risks include serious fractures, breathing problems during sleep, hyperalgesia, immunosuppression, chronic constipation, bowel obstruction, myocardial infarction, and tooth decay secondary to xerostomia.  Unpredictable adverse effects that can occur even if you take your medication correctly: Cognitive impairment, respiratory depression, and death. Most people think that if they take their medication "correctly", and "as instructed", that they will be safe. Nothing could be farther from the truth. In reality, a significant amount of recorded deaths associated with the use of opioids has occurred in individuals that had taken the medication for a long time, and were taking their medication correctly. The following are examples of how this can happen: Patient taking his/her medication for a long time, as instructed, without any side effects, is given a certain antibiotic or another unrelated medication, which in turn triggers a "Drug-to-drug interaction" leading to disorientation, cognitive impairment, impaired reflexes, respiratory depression or an untoward event leading to serious bodily harm or injury, including death.  Patient taking his/her medication for a long time, as instructed, without any side effects, develops an acute impairment of liver and/or kidney function. This will lead to a rapid inability of the body to breakdown and eliminate  their pain medication, which will result in effects similar to an "overdose", but with the same medicine and dose that they had always taken. This again may lead to disorientation, cognitive impairment, impaired reflexes, respiratory depression or an untoward event leading to serious bodily harm or injury, including death.  A similar problem will occur with patients as they grow older and their liver and kidney function begins to decrease as part of the aging process.  Background information: Historically, the original case for using long-term opioid therapy to treat chronic noncancer pain was based on safety assumptions that subsequent experience has called into question. In 1996, the American Pain Society and the American Academy of Pain Medicine issued a consensus statement supporting long-term opioid therapy. This statement acknowledged the dangers of opioid prescribing but concluded that the risk for addiction was low; respiratory depression induced by opioids was short-lived, occurred mainly in opioid-naive patients, and was antagonized by pain; tolerance was not a common problem; and efforts to control diversion should not constrain opioid prescribing. This has now proven to be wrong. Experience regarding the risks for opioid addiction, misuse, and overdose in community practice has failed to support these assumptions.  According to the Centers for Disease Control and Prevention, fatal overdoses involving opioid analgesics have increased sharply over the past decade. Currently, more than 96,700 people die from drug overdoses every year. Opioids are a factor in 7 out of every 10 overdose deaths. Deaths from drug overdose have surpassed motor vehicle accidents as the leading cause of death for individuals between the ages of 9 and 12.  Clinical data suggest that neuroendocrine dysfunction may be very common in both men and women, potentially causing hypogonadism, erectile dysfunction, infertility,  decreased libido, osteoporosis, and depression. Recent studies linked higher opioid dose to increased opioid-related mortality. Controlled observational studies reported that long-term opioid therapy may be associated with increased risk for cardiovascular events. Subsequent  meta-analysis concluded that the safety of long-term opioid therapy in elderly patients has not been proven.   Side Effects and adverse reactions: Common side effects: Drowsiness (sedation). Dizziness. Nausea and vomiting. Constipation. Physical dependence -- Dependence often manifests with withdrawal symptoms when opioids are discontinued or decreased. Tolerance -- As you take repeated doses of opioids, you require increased medication to experience the same effect of pain relief. Respiratory depression -- This can occur in healthy people, especially with higher doses. However, people with COPD, asthma or other lung conditions may be even more susceptible to fatal respiratory impairment.  Uncommon side effects: An increased sensitivity to feeling pain and extreme response to pain (hyperalgesia). Chronic use of opioids can lead to this. Delayed gastric emptying (the process by which the contents of your stomach are moved into your small intestine). Muscle rigidity. Immune system and hormonal dysfunction. Quick, involuntary muscle jerks (myoclonus). Arrhythmia. Itchy skin (pruritus). Dry mouth (xerostomia).  Long-term side effects: Chronic constipation. Sleep-disordered breathing (SDB). Increased risk of bone fractures. Hypothalamic-pituitary-adrenal dysregulation. Increased risk of overdose.  RISKS: Respiratory depression and death: Opioids increase the risk of respiratory depression and death.  Drug-to-drug interactions: Opioids are relatively contraindicated in combination with benzodiazepines, sleep inducers, and other central nervous system depressants. Other classes of medications (i.e.: certain antibiotics  and even over-the-counter medications) may also trigger or induce respiratory depression in some patients.  Medical conditions: Patients with pre-existing respiratory problems are at higher risk of respiratory failure and/or depression when in combination with opioid analgesics. Opioids are relatively contraindicated in some medical conditions such as central sleep apnea.   Fractures and Falls:  Opioids increase the risk and incidence of falls. This is of particular importance in elderly patients.  Endocrine System:  Long-term administration is associated with endocrine abnormalities (endocrinopathies). (Also known as Opioid-induced Endocrinopathy) Influences on both the hypothalamic-pituitary-adrenal axis?and the hypothalamic-pituitary-gonadal axis have been demonstrated with consequent hypogonadism and adrenal insufficiency in both sexes. Hypogonadism and decreased levels of dehydroepiandrosterone sulfate have been reported in men and women. Endocrine effects include: Amenorrhoea in women (abnormal absence of menstruation) Reduced libido in both sexes Decreased sexual function Erectile dysfunction in men Hypogonadisms (decreased testicular function with shrinkage of testicles) Infertility Depression and fatigue Loss of muscle mass Anxiety Depression Immune suppression Hyperalgesia Weight gain Anemia Osteoporosis Patients (particularly women of childbearing age) should avoid opioids. There is insufficient evidence to recommend routine monitoring of asymptomatic patients taking opioids in the long-term for hormonal deficiencies.  Immune System: Human studies have demonstrated that opioids have an immunomodulating effect. These effects are mediated via opioid receptors both on immune effector cells and in the central nervous system. Opioids have been demonstrated to have adverse effects on antimicrobial response and anti-tumour surveillance. Buprenorphine has been demonstrated to have  no impact on immune function.  Opioid Induced Hyperalgesia: Human studies have demonstrated that prolonged use of opioids can lead to a state of abnormal pain sensitivity, sometimes called opioid induced hyperalgesia (OIH). Opioid induced hyperalgesia is not usually seen in the absence of tolerance to opioid analgesia. Clinically, hyperalgesia may be diagnosed if the patient on long-term opioid therapy presents with increased pain. This might be qualitatively and anatomically distinct from pain related to disease progression or to breakthrough pain resulting from development of opioid tolerance. Pain associated with hyperalgesia tends to be more diffuse than the pre-existing pain and less defined in quality. Management of opioid induced hyperalgesia requires opioid dose reduction.  Cancer: Chronic opioid therapy has been associated with an increased risk  of cancer among noncancer patients with chronic pain. This association was more evident in chronic strong opioid users. Chronic opioid consumption causes significant pathological changes in the small intestine and colon. Epidemiological studies have found that there is a link between opium dependence and initiation of gastrointestinal cancers. Cancer is the second leading cause of death after cardiovascular disease. Chronic use of opioids can cause multiple conditions such as GERD, immunosuppression and renal damage as well as carcinogenic effects, which are associated with the incidence of cancers.   Mortality: Long-term opioid use has been associated with increased mortality among patients with chronic non-cancer pain (CNCP).  Prescription of long-acting opioids for chronic noncancer pain was associated with a significantly increased risk of all-cause mortality, including deaths from causes other than overdose.  Reference: Von Korff M, Kolodny A, Deyo RA, Chou R. Long-term opioid therapy reconsidered. Ann Intern Med. 2011 Sep 6;155(5):325-8. doi:  10.7326/[id number]. PMID: 65784696; PMCID: [id number]. Randon Goldsmith, Hayward RA, Dunn KM, [place] KP. Risk of adverse events in patients prescribed long-term opioids: A cohort study in the [place] [facility] [employer]. Eur J Pain. 2019 May;23(5):908-922. doi: 10.1002/ejp.1357. Epub 2019 Jan 31. PMID: 40102725. Colameco S, Coren JS, Ciervo CA. Continuous opioid treatment for chronic noncancer pain: a time for moderation in prescribing. Postgrad Med. 2009 Jul;121(4):61-6. doi: 10.3810/pgm.2009.07.2032. PMID: 36644034. William Hamburger RN, [place] SD, Blazina I, Cristopher [place], Bougatsos C, Deyo RA. The effectiveness and risks of long-term opioid therapy for chronic pain: a systematic review for a [employer] to [employer]. Ann Intern Med. 2015 Feb 17;162(4):276-86. doi: 10.7326/M14-2559. PMID: 74259563. Caryl Bis [facility], Makuc DM. NCHS Data Brief No. 22. [place]: Centers for Disease Control and Prevention; 2009. Sep, Increase in Fatal Poisonings Involving Opioid Analgesics in the [place], 1999-2006. Song IA, Choi HR, Oh TK. Long-term opioid use and mortality in patients with chronic non-cancer pain: Ten-year follow-up study in [place] from 2010 through 2019. EClinicalMedicine. 2022 Jul 18;51:101558. doi: [id number]/j.eclinm.[id number]. PMID: 32951884; PMCID: [id number]. Huser, W., Schubert, T., Vogelmann, T. et al. All-cause mortality in patients with long-term opioid therapy compared with non-opioid analgesics for chronic non-cancer pain: a database study. BMC Med 18, 162 (2020). http://lester.info/ Rashidian H, Karie Kirks, Malekzadeh R, Haghdoost AA. An Ecological Study of the Association between Opiate Use and Incidence of Cancers. Addict Health. 2016 Fall;8(4):252-260. PMID: 10932355; PMCID: [id number].  Our Goal: Our goal is to control your pain with means other  than the use of opioid pain medications.  Our Recommendation: Talk to your physician about coming off of these medications. We can assist you with the tapering down and stopping these medicines. Based on the new information, even if you cannot completely stop the medication, a decrease in the dose may be associated with a lesser risk. Ask for other means of controlling the pain. Decrease or eliminate those factors that significantly contribute to your pain such as smoking, obesity, and a diet heavily tilted towards "inflammatory" nutrients.  Last Updated: 12/21/2022   ______________________________________________________________________

## 2024-05-15 ENCOUNTER — Encounter: Payer: Self-pay | Admitting: Nurse Practitioner

## 2024-05-15 ENCOUNTER — Ambulatory Visit: Attending: Nurse Practitioner | Admitting: Nurse Practitioner

## 2024-05-15 VITALS — BP 131/67 | HR 82 | Temp 97.0°F | Resp 18 | Ht 64.0 in | Wt 130.0 lb

## 2024-05-15 DIAGNOSIS — M533 Sacrococcygeal disorders, not elsewhere classified: Secondary | ICD-10-CM | POA: Diagnosis not present

## 2024-05-15 DIAGNOSIS — G894 Chronic pain syndrome: Secondary | ICD-10-CM | POA: Insufficient documentation

## 2024-05-15 DIAGNOSIS — Z981 Arthrodesis status: Secondary | ICD-10-CM | POA: Insufficient documentation

## 2024-05-15 DIAGNOSIS — G588 Other specified mononeuropathies: Secondary | ICD-10-CM | POA: Insufficient documentation

## 2024-05-15 DIAGNOSIS — G5703 Lesion of sciatic nerve, bilateral lower limbs: Secondary | ICD-10-CM | POA: Diagnosis not present

## 2024-05-15 DIAGNOSIS — M461 Sacroiliitis, not elsewhere classified: Secondary | ICD-10-CM | POA: Insufficient documentation

## 2024-05-15 NOTE — Progress Notes (Signed)
 PROVIDER NOTE: Interpretation of information contained herein should be left to medically-trained personnel. Specific patient instructions are provided elsewhere under Patient Instructions section of medical record. This document was created in part using AI and STT-dictation technology, any transcriptional errors that may result from this process are unintentional.  Patient: Dana Massey  Service: E/M   PCP: Avelina Greig BRAVO, MD  DOB: 07-28-1945  DOS: 05/15/2024  Provider: Emmy MARLA Blanch, NP  MRN: [id number]  Delivery: Face-to-face  Specialty: Interventional Pain Management  Type: Established Patient  Setting: Ambulatory outpatient facility  Specialty designation: 09  Referring Prov.: Avelina Greig BRAVO, MD  Location: Outpatient office facility       History of present illness (HPI) Dana Massey, a 78 y.o. year old female, is here today because of her hip pain. Ms. Merlin primary complain today is Hip Pain (Butt Bone)  Pertinent problems: Ms. Anfinson has MITRAL VALVE PROLAPSE; Venous (peripheral) insufficiency; SCOLIOSIS; Reflex sympathetic dystrophy of lower extremity; BCC (basal cell carcinoma of skin); MDD (major depressive disorder), recurrent episode, moderate (HCC); Obstructive sleep apnea; Hyperlipidemia associated with type 2 diabetes mellitus (HCC); Chronic kidney disease, stage 3a (HCC); Cluneal neuropathy; Melanoma of skin (HCC); Type 2 diabetes mellitus with stage 3a chronic kidney disease, without long-term current use of insulin (HCC); Bilateral buttock pain; Degeneration of intervertebral disc of lumbar region with lower extremity pain; History of lumbar spinal fusion; Acute midline low back pain with left-sided sciatica; 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  Pain Assessment: Severity of Chronic pain is reported as a 0-No  pain/10. Location: Back Lower/Denies. Onset: More than a month ago. Quality:  . Timing: Intermittent. Modifying factor(s): Rest, not sitting on Bottom for extended periods, streching. Vitals:  height is 5' 4 (1.626 m) and weight is 130 lb (59 kg). Her temporal temperature is 97 F (36.1 C) (abnormal). Her blood pressure is 131/67 and her pulse is 82. Her respiration is 18 and oxygen  saturation is 100%.  BMI: Estimated body mass index is 22.31 kg/m as calculated from the following:   Height as of this encounter: 5' 4 (1.626 m).   Weight as of this encounter: 130 lb (59 kg).  Last encounter: Visit date not found. Last procedure: 04/12/2024  Reason for encounter: post-procedure evaluation and assessment.   Dana Massey underwent a diagnostic/therapeutic sacral iliac joint injection and bilateral piriformis TPI on April 12, 2024.  She reports initially 90% pain relief and functional improvement during local anesthetic phase, followed by sustained 90% ongoing pain relief and functional improvement since the procedure.   Discussed the use of AI scribe software for clinical note transcription with the patient, who gave verbal consent to proceed.  History of Present Illness   INA SCRIVENS is a 78 year old female who presents for follow-up of pain management after recent SI joint and bilateral TPI injections.  After the recent SI joint and bilateral trigger point injections, her pain level has significantly decreased. Prior to the procedure, her pain level was consistently at a 5 out of 10. Post-procedure, her pain ranges between 2 and 3, and today she reports a pain level of zero.  She previously underwent a cluneal nerve block in September, which did not provide significant relief. The recent injections, including bilateral piriformis trigger points, have been more effective in managing her pain.  She discontinued the use of Butrans   7.5 mg patch as it was not effective. She is currently  managing her pain without any specific pain medication and is able to tolerate a pain level of 2 to 3. She continues to take Cymbalta , which may be contributing to her pain management.   Procedure Procedure: Sacroiliac Joint Steroid Injection #1   and bilateral piriformis TPI #1 Laterality: Bilateral     Level: PIIS (Posterior Inferior Iliac Spine)  Target: Interarticular sacroiliac joint. Location: Medial to the postero-medial edge of iliac spine. Region: Lumbosacral-sacrococcygeal. Approach: Inferior postero-medial percutaneous approach. Type of procedure: Percutaneous joint injection.   Imaging: Fluoroscopy-guided Non-spinal ([id number]) Anesthesia: Local anesthesia (1-2% Lidocaine ) DOS: 04/12/2024  Performed by: Wallie Sherry, MD   Purpose: Diagnostic/Therapeutic Indications: Sacroiliac joint pain in the lower back and hip area severe enough to impact quality of life or function. Rationale (medical necessity): procedure needed and proper for the diagnosis and/or treatment of Dana Massey's medical symptoms and needs. 1. Sacroiliac joint pain   2. SI joint arthritis   3. Piriformis syndrome of both sides     NAS-11 Pain score:        Pre-procedure: 5 /10        Post-procedure: 0-No pain/10  Post-Procedure Evaluation   Effectiveness:  Initial hour after procedure: 90 % . Subsequent 4-6 hours post-procedure: 75 % . Analgesia past initial 6 hours: 90 % . Ongoing improvement:  Analgesic:  90% Function: Dana Massey reports improvement in function ROM: Dana Massey reports improvement in ROM Interpretation: Dana Massey underwent a diagnostic/therapeutic sacral iliac joint injection and bilateral piriformis TPI on April 12, 2024.  She reports initially 90% pain relief and functional improvement during local anesthetic phase, followed by sustained 90% ongoing pain relief and functional improvement since the procedure.     Pharmacotherapy Assessment   Monitoring: [place] PMP: PDMP  reviewed during this encounter.       Pharmacotherapy: No side-effects or adverse reactions reported. Compliance: No problems identified. Effectiveness: Clinically acceptable.  Dana Massey, [place]  05/15/2024 11:23 AM  Sign when Signing Visit Safety precautions to be maintained throughout the outpatient stay will include: orient to surroundings, keep bed in low position, maintain call bell within reach at all times, provide assistance with transfer out of bed and ambulation.     UDS:  Summary  Date Value Ref Range Status  01/06/2024 FINAL  Final    Comment:    ==================================================================== Compliance Drug Analysis, Ur ==================================================================== Test                             Result       Flag       Units  Drug Present and Declared for Prescription Verification   Gabapentin                     PRESENT      EXPECTED   Duloxetine                      PRESENT      EXPECTED   Metoprolol                      PRESENT      EXPECTED  Drug Absent but Declared for Prescription Verification   Buprenorphine                   Not Detected UNEXPECTED ng/mg creat    Transdermal buprenorphine , as  indicated in the declared medication    list, is not always detected even when used as directed.    Acetaminophen                  Not Detected UNEXPECTED    Acetaminophen, as indicated in the declared medication list, is not    always detected even when used as directed.    Salicylate                     Not Detected UNEXPECTED    Aspirin, as indicated in the declared medication list, is not always    detected even when used as directed.  ==================================================================== Test                      Result    Flag   Units      Ref Range   Creatinine              96               mg/dL      >=79 ==================================================================== Declared Medications:   The flagging and interpretation on this report are based on the  following declared medications.  Unexpected results may arise from  inaccuracies in the declared medications.   **Note: The testing scope of this panel includes these medications:   Duloxetine  (Cymbalta )  Gabapentin (Neurontin)  Metoprolol  (Toprol )   **Note: The testing scope of this panel does not include small to  moderate amounts of these reported medications:   Acetaminophen  Aspirin  Buprenorphine  Patch (BuTrans )   **Note: The testing scope of this panel does not include the  following reported medications:   Atorvastatin  (Lipitor)  Calcium   Dorzolamide  Latanoprost (Xalatan)  Meclizine   Meloxicam  (Mobic )  Multivitamin  Omega-3 Fatty Acids  Polyethylene Glycol  Timolol  Vitamin D  ==================================================================== For clinical consultation, please call [phone]. ====================================================================     No results found for: CBDTHCR No results found for: D8THCCBX No results found for: D9THCCBX  ROS  Constitutional: Denies any fever or chills Gastrointestinal: No reported hemesis, hematochezia, vomiting, or acute GI distress Musculoskeletal: Hip pain (bilateral) Neurological: No reported episodes of acute onset apraxia, aphasia, dysarthria, agnosia, amnesia, paralysis, loss of coordination, or loss of consciousness  Medication Review  Acetaminophen, Calcium -Vitamin D -Vitamin K, DULoxetine , Meclizine  HCl, alendronate , aspirin EC, atorvastatin , dorzolamide-timolol, fish oil-omega-3 fatty acids, gabapentin, latanoprost, metoprolol  succinate, multivitamin with minerals, and polyethylene glycol powder  History Review  Allergy: Ms. Bevan is allergic to wasp venom. Drug: Ms. Mccready  reports no history of drug use. Alcohol:  reports no history of alcohol use. Tobacco:  reports that she has never smoked. She has never used  smokeless tobacco. Social: Ms. Ruark  reports that she has never smoked. She has never used smokeless tobacco. She reports that she does not drink alcohol and does not use drugs. Medical:  has a past medical history of Allergy (12/24/2003), Cataract, Constipation, Depression, Diabetes mellitus without complication (HCC), Diverticulosis of colon, Glaucoma, History of blood transfusion (1994), IBS (irritable bowel syndrome), Migraine headache, Mitral valve prolapse, Neuromuscular disorder (HCC), Osteopenia, Osteoporosis, PAC (premature atrial contraction), Palpitations, Scoliosis, Shoulder pain, Skin cancer (2018), Sleep apnea, Toxic effect of venom(989.5), and Venous insufficiency. Surgical: Ms. Awtrey  has a past surgical history that includes Back surgery (1994); Rotator cuff repair (2006); Cataract extraction (Left, 04/2010); Wisdom tooth extraction; Colonoscopy (2009); Eye surgery (05/05/2010,  02/05/2020); and Spine surgery (09/09/1992). Family: family history includes  Cancer in her father; Diabetes in her mother; Kidney disease in her mother; Stroke in her paternal grandmother.  Laboratory Chemistry Profile   Renal Lab Results  Component Value Date   BUN 18 04/04/2024   CREATININE 0.65 04/04/2024   GFR 84.84 04/04/2024   GFRAA 54 (L) 07/05/2019   GFRNONAA 47 (L) 07/05/2019    Hepatic Lab Results  Component Value Date   AST 24 04/04/2024   ALT 16 04/04/2024   ALBUMIN 4.1 04/04/2024   ALKPHOS 60 04/04/2024   HCVAB NEGATIVE 12/25/2015    Electrolytes Lab Results  Component Value Date   NA 139 04/04/2024   K 4.2 04/04/2024   CL 100 04/04/2024   CALCIUM  8.8 04/04/2024    Bone Lab Results  Component Value Date   VD25OH 63.48 02/08/2019    Inflammation (CRP: Acute Phase) (ESR: Chronic Phase) No results found for: CRP, ESRSEDRATE, LATICACIDVEN       Note: Above Lab results reviewed.  Recent Imaging Review  DG PAIN CLINIC C-ARM 1-60 MIN NO REPORT Fluoro was used, but  no Radiologist interpretation will be provided.  Please refer to NOTES tab for provider progress note. Note: Reviewed        Physical Exam  Vitals: BP 131/67 (BP Location: Right Arm, Patient Position: Sitting, Cuff Size: Normal)   Pulse 82   Temp (!) 97 F (36.1 C) (Temporal)   Resp 18   Ht 5' 4 (1.626 m)   Wt 130 lb (59 kg)   LMP 09/13/2000   SpO2 100%   BMI 22.31 kg/m  BMI: Estimated body mass index is 22.31 kg/m as calculated from the following:   Height as of this encounter: 5' 4 (1.626 m).   Weight as of this encounter: 130 lb (59 kg). Ideal: Ideal body weight: 54.7 kg (120 lb 9.5 oz) Adjusted ideal body weight: 56.4 kg (124 lb 5.7 oz) General appearance: Well nourished, well developed, and well hydrated. In no apparent acute distress Mental status: Alert, oriented x 3 (person, place, & time)       Respiratory: No evidence of acute respiratory distress Eyes: PERLA  Musculoskeletal: Bilateral hip pain (improved) SI joint pain (improved) Assessment   Diagnosis Status  1. Sacroiliac joint pain   2. SI joint arthritis   3. Piriformis syndrome of both sides   4. Cluneal neuropathy   5. History of lumbar fusion   6. History of lumbar spinal fusion   7. Chronic pain syndrome    Improved Improved Improved   Updated Problems: Problem  Piriformis Syndrome of Both Sides    Plan of Care  Problem-specific:  Assessment and Plan    Chronic low back pain with sacroiliac joint and piriformis involvement Recent SI joint and bilateral piriformis injections reduced pain from 5 to 2-3, currently at 0. Positive response with SI joint injection and bilateral piriformis TPI with ongoing 90% pain relief and functional improvement.   - Discontinued Butrans  7.5 due to ineffectiveness. Cymbalta  may aid pain management. Prefers minimal medication. - Perform SI joint and bilateral piriformis injections every three months if pain returns to baseline and response is positive. -  Continue Cymbalta  for pain management. - Use Advil or ibuprofen as needed. - Schedule procedure as needed, with evaluation by virtual visit or face-to-face.     Ms. Desirae C Avallone has a current medication list which includes the following long-term medication(s): atorvastatin , calcium -vitamin d -vitamin k, duloxetine , gabapentin, and metoprolol  succinate.  Pharmacotherapy (Medications Ordered): No orders of the defined  types were placed in this encounter.  Orders:  No orders of the defined types were placed in this encounter.       Return for (PRN), (VV), Emmy Blanch NP.    Recent Visits Date Type Provider Dept  04/12/24 Procedure visit Marcelino Nurse, MD [facility]  03/23/24 Office Visit Marcelino Nurse, MD [facility]  02/23/24 Procedure visit Marcelino Nurse, MD [facility]  Showing recent visits within past 90 days and meeting all other requirements Today's Visits Date Type Provider Dept  05/15/24 Office Visit Emmert Roethler K, NP [facility]  Showing today's visits and meeting all other requirements Future Appointments No visits were found meeting these conditions. Showing future appointments within next 90 days and meeting all other requirements  I discussed the assessment and treatment plan with the patient. The patient was provided an opportunity to ask questions and all were answered. The patient agreed with the plan and demonstrated an understanding of the instructions.  Patient advised to call back or seek an in-person evaluation if the symptoms or condition worsens.  I personally spent a total of 20 minutes in the care of the patient today including preparing to see the patient, getting/reviewing separately obtained history, performing a medically appropriate exam/evaluation, counseling and educating, placing orders, referring and communicating with other health care professionals, documenting clinical information in the EHR,  independently interpreting results, communicating results, and coordinating care.   Note by: Marygrace Sandoval K Jaque Dacy, NP (TTS and AI technology used. I apologize for any typographical errors that were not detected and corrected.) Date: 05/15/2024; Time: 12:38 PM

## 2024-05-15 NOTE — Progress Notes (Signed)
 Safety precautions to be maintained throughout the outpatient stay will include: orient to surroundings, keep bed in low position, maintain call bell within reach at all times, provide assistance with transfer out of bed and ambulation.

## 2024-06-02 ENCOUNTER — Ambulatory Visit

## 2024-06-02 VITALS — BP 122/75 | HR 79 | Ht 64.0 in | Wt 135.6 lb

## 2024-06-02 DIAGNOSIS — M5416 Radiculopathy, lumbar region: Secondary | ICD-10-CM

## 2024-06-02 DIAGNOSIS — M25361 Other instability, right knee: Secondary | ICD-10-CM | POA: Diagnosis not present

## 2024-06-02 NOTE — Progress Notes (Signed)
 "  Office Visit Note   Patient: Dana Massey           Date of Birth: October 10, 1945           MRN: [id number] Visit Date: 06/02/2024              Requested by: Avelina Greig BRAVO, MD [address] [place],  [place] [address] PCP: Avelina Greig BRAVO, MD   Assessment & Plan: Visit Diagnoses:  1. Radiculopathy, lumbar region     Plan: Natural history and expected course discussed. Questions answered. Given long history of low back problems and history of lumbar fusion, as well as symptoms of right leg weakness and instability, patient referred to neurosurgery for evaluation. Not likely instability coming from knee.  Follow-Up Instructions: Return if symptoms worsen or fail to improve.   Orders:  Orders Placed This Encounter  Procedures   DG Knee 3 Views Right   Ambulatory referral to Neurosurgery   No orders of the defined types were placed in this encounter.    Subjective: Right knee instability  HPI 78 year old female reports that she has right knee instability occasionally. Right leg feels week. Denies pain in the knee itself, but has buttock/lower back pain which shoots down the right leg. Has a history of L spine fusion 30 years ago. No other complaints.  Objective: Vital Signs: BP 122/75 (Cuff Size: Normal)   Pulse 79   Ht 5' 4 (1.626 m)   Wt 135 lb 9.6 oz (61.5 kg)   LMP 09/13/2000   BMI 23.28 kg/m   Physical Exam Gen: Alert, No Acute Distress right knee: Skin intact, no erythema or induration noted. No tenderness to palpation. Range of motion 0 to 134. No instability noted.  Lumbar spine: Skin intact, no erythema or induration. Well healed scar. TTP of sciatic notch. Negative straight leg raise. Normal patellar and achilles reflexes on right side  Imaging: Radiographs of right knee personally reviewed by me, there are   Radiographs of L spine reviewed by me, reveal previous fusion with intact hardware, no signs of complication  MRI LUMBAR SPINE WITHOUT  CONTRAST(09/30/2020)  TECHNIQUE: Multiplanar, multisequence MR imaging of the lumbar spine was performed. No intravenous contrast was administered.  COMPARISON: No pertinent prior exams available for comparison.  FINDINGS: Despite the use of metal artifact reduction sequences, there is severe susceptibility artifact arising from a posterior spinal fusion construct extending from the thoracic spine to the level of the sacrum. This significantly obscures the vertebrae, spinal canal and neural foramina, and significantly limits evaluation. It also significantly limits evaluation for marrow edema/osseous lesions.  Segmentation: For the purposes of this dictation, five lumbar vertebrae are assumed and the caudal most well-formed intervertebral disc is designated L5-S1.  Alignment: Lumbar levocurvature. Mild L1-L2 grade 1 retrolisthesis.  Vertebrae: No vertebral body height loss, focal suspicious marrow lesion or significant marrow edema is identified (although susceptibility artifact significantly limits evaluation).  Conus medullaris and cauda equina: Susceptibility artifact largely obscures the spinal canal, precluding identification of the termination of the conus medullaris. It also largely obscures the cauda equina.  Paraspinal and other soft tissues: No abnormality identified within included portions of the abdomen/retroperitoneum. Postsurgical changes to the dorsal thoracic, lumbar and sacral paraspinal soft tissues.  Disc levels:  Multilevel disc degeneration, incompletely assessed.  At L1-L2, there is advanced disc degeneration. Mild disc bulging and endplate spurring. The spinal canal is fairly well visualized. No apparent spinal canal stenosis at this level.  The neural foramina are obscured by susceptibility artifact.  At L4-L5, there is moderate/advanced disc degeneration with possible fusion across the disc space. Small disc bulge and mild endplate spurring. The  spinal canal is well visualized. No significant spinal canal stenosis. No appreciable foraminal stenosis.  At L5-S1, there is fusion across the disc space. Endplate spurring. The spinal canal is fairly well visualized. No appreciable spinal canal stenosis at this level. No significant neural foraminal narrowing.  Susceptibility artifact precludes adequate evaluation at the remaining levels.  IMPRESSION: Despite the use of metal artifact reduction sequences, there is severe susceptibility artifact arising from a posterior spinal fusion construct extending from the thoracic spine to the level of the sacrum. This significantly obscures the vertebrae, spinal canal and neural foramina, and significantly limits evaluation. It also significantly limits evaluation for marrow edema/osseous lesions.  At L1-L2, there is advanced disc degeneration on the right. Mild disc bulging and endplate spurring. The spinal canal is fairly well visualized. No apparent spinal canal stenosis at this level. The neural foramina are obscured by susceptibility artifact.  At L4-L5, there is moderate/advanced disc degeneration with possible fusion across the disc space. Small disc bulge and mild endplate spurring. The spinal canal is well visualized. No significant spinal canal stenosis. The neural foramina are partially obscured by susceptibility artifact. No appreciable foraminal stenosis.  At L5-S1, there is fusion across the disc space. Endplate spurring. The spinal canal is fairly well visualized. No appreciable spinal canal stenosis at this level. No significant neural foraminal narrowing.  Susceptibility artifact precludes adequate evaluation at the remaining levels.  Lumbar levocurvature.   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   Acute midline low back pain with left-sided sciatica 10/27/2023   Bilateral buttock pain 04/08/2023   Degeneration of intervertebral disc of lumbar region with lower extremity pain 04/08/2023   History of lumbar spinal fusion 04/08/2023   Type 2 diabetes mellitus with stage 3a chronic kidney disease, without long-term current use of insulin (HCC) 03/18/2022   Melanoma of skin (HCC) 03/16/2022   Varicose veins of leg with swelling, left 12/23/2021   Benign paroxysmal positional vertigo 07/02/2021   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   Hyperlipidemia associated with type 2 diabetes mellitus (HCC) 09/18/2019   Chronic kidney disease, stage 3a (HCC) 09/18/2019   Obstructive sleep apnea 07/05/2019   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/2019   Open-angle glaucoma of both eyes, mild stage 02/07/2019   Essential tremor 05/20/2016   MDD (major depressive disorder), recurrent episode, moderate (HCC) 12/25/2015   BCC (basal cell carcinoma of skin) 07/29/2015   Reflex sympathetic dystrophy of lower extremity 06/05/2014   MITRAL VALVE PROLAPSE 10/10/2007   Venous (peripheral) insufficiency 10/10/2007   Irritable bowel syndrome 10/10/2007   SCOLIOSIS 10/10/2007   PAC (premature atrial contraction) 10/10/2007   Ocular hypertension 06/15/1998   Past Medical History:  Diagnosis Date   Allergy 12/24/2003   Wasp venom   Cataract    left eye surgery to remove, right eye just watching currently   Constipation    Depression    Diabetes mellitus without complication (HCC)    Diverticulosis of colon    Glaucoma  ocular htn bilateral eyes - uses drops    History of blood transfusion 1994   with spinal fusion surgery   IBS (irritable bowel syndrome)    Migraine headache    resolved, no current problem   Mitral valve  prolapse    patient denies this dx   Neuromuscular disorder (HCC)    essential tremor hands   Osteopenia    Osteoporosis    PAC (premature atrial contraction)    on metoprolol    Palpitations    Scoliosis    Shoulder pain    resolved, no loonger a problem   Skin cancer 2018   basal cell carcinoma on right hand   Sleep apnea    Toxic effect of venom(989.5)    Venous insufficiency     Family History  Problem Relation Age of Onset   Diabetes Mother    Kidney disease Mother    Cancer Father        melanoma   Stroke Paternal Grandmother    Colon cancer Neg Hx    Rectal cancer Neg Hx    Stomach cancer Neg Hx    Esophageal cancer Neg Hx    Colon polyps Neg Hx     Past Surgical History:  Procedure Laterality Date   BACK SURGERY  1994   scoliosis at [facility]   CATARACT EXTRACTION Left 04/2010   COLONOSCOPY  2009   [facility]- Normal   EYE SURGERY  05/05/2010,  02/05/2020   Cataracts   ROTATOR CUFF REPAIR  2006   SPINE SURGERY  09/09/1992   Spinal fusion (scoliosis)   WISDOM TOOTH EXTRACTION     Social History   Occupational History   Not on file  Tobacco Use   Smoking status: Never   Smokeless tobacco: Never  Vaping Use   Vaping status: Never Used  Substance and Sexual Activity   Alcohol use: Never    Comment: rarely - holiday   Drug use: No   Sexual activity: Yes    Partners: Male    Birth control/protection: Post-menopausal    Comment: husband vasectomy       "

## 2024-06-15 ENCOUNTER — Other Ambulatory Visit: Payer: Self-pay | Admitting: Family Medicine

## 2024-07-19 ENCOUNTER — Ambulatory Visit: Admitting: Orthopedic Surgery

## 2024-07-20 NOTE — Progress Notes (Unsigned)
 "  Referring Physician:  Mariah Thamas RAMAN, MD [address] [place]  [place],  [place] [address]  Primary Physician:  Dana Greig BRAVO, MD  History of Present Illness: 07/21/2024 [name] has a history of venous insufficiency (peripheral), OSA, IBS, hyperlipidemia, DM, CKD stage 3, vertigo, cluneal neuropathy, essential tremor, RSD of lower extremity, basal cell carcinoma of skin, melanoma of skin, osteoporosis, scoliosis, chronic pain syndrome, ocular hypertension, open angle glaucoma of both eyes.   History of TL fusion for scoliosis years ago.   Has been seeing pain management (Lateef) and did well with SI joint and bilateral piriformis TPI 04/12/24. No improvement with previous cluneal nerve block.   She has intermittent LBP/buttock with intermittent bilateral leg pain (anterior and posterior) to her knees x 5-6 years. LBP/buttock pain > leg pain, right leg pain = left leg pain. Pain is worse at night. She has numbness, tingling, and weakness in her legs. Pain is worse with prolonged sitting. Pain is better with ice and moving/walking.    EMG of bilateral lower extremities on 10/14/20 showed generalized sensorimotor peripheral neuropathy.   Tobacco use: Does not smoke.   Bowel/Bladder Dysfunction: none  Conservative measures:  Physical therapy:  [facility] for her back/balance in summer 2025 Multimodal medical therapy including regular antiinflammatories:  Gabapentin, Cymbalta , Meloxicam , Tylenol, Butran's patches Injections:  04/12/2024- Sacroiliac Joint Steroid Injection and bilateral piriformis TPI #1-Bilateral 02/23/2024- Bilateral cluneal nerve block       Past Surgery:  1994- spinal fusion surgery in 1994 with the placement of twelve screws from T11 to the sacrum.   Dana Massey has no symptoms of cervical myelopathy.  The symptoms are causing a significant impact on the patient's life.   Review of Systems:  A 10 point review of systems is negative, except  for the pertinent positives and negatives detailed in the HPI.  Past Medical History: Past Medical History:  Diagnosis Date   Allergy 12/24/2003   Wasp venom   Cataract    left eye surgery to remove, right eye just watching currently   Constipation    Depression    Diabetes mellitus without complication (HCC)    Diverticulosis of colon    Glaucoma    ocular htn bilateral eyes - uses drops    History of blood transfusion 1994   with spinal fusion surgery   IBS (irritable bowel syndrome)    Migraine headache    resolved, no current problem   Mitral valve prolapse    patient denies this dx   Neuromuscular disorder (HCC)    essential tremor hands   Osteopenia    Osteoporosis    PAC (premature atrial contraction)    on metoprolol    Palpitations    Scoliosis    Shoulder pain    resolved, no loonger a problem   Skin cancer 2018   basal cell carcinoma on right hand   Sleep apnea    Toxic effect of venom(989.5)    Venous insufficiency     Past Surgical History: Past Surgical History:  Procedure Laterality Date   BACK SURGERY  1994   scoliosis at [facility]   CATARACT EXTRACTION Left 04/2010   COLONOSCOPY  2009   [facility]- Normal   EYE SURGERY  05/05/2010,  02/05/2020   Cataracts   ROTATOR CUFF REPAIR  2006   SPINE SURGERY  09/09/1992   Spinal fusion (scoliosis)   WISDOM TOOTH EXTRACTION      Allergies: Allergies as of 07/21/2024 - Review  Complete 05/15/2024  Allergen Reaction Noted   Wasp venom Anaphylaxis 05/19/2013    Medications: Outpatient Encounter Medications as of 07/21/2024  Medication Sig   Acetaminophen 500 MG coapsule Take 50 mg by mouth every 6 (six) hours as needed.   alendronate  (FOSAMAX ) 70 MG tablet Take 1 tablet (70 mg total) by mouth every 7 (seven) days. Take with a full glass of water on an empty stomach.   aspirin EC 81 MG tablet Take 81 mg by mouth daily.   atorvastatin  (LIPITOR) 10 MG tablet TAKE 1 TABLET EVERY DAY   Calcium -Vitamin  D-Vitamin K 500-100-40 MG-UNT-MCG CHEW Chew 1 capsule by mouth daily.     dorzolamide-timolol (COSOPT) 22.3-6.8 MG/ML ophthalmic solution Place 1 drop into both eyes in the morning and at bedtime.   DULoxetine  (CYMBALTA ) 60 MG capsule TAKE 1 CAPSULE EVERY DAY   fish oil-omega-3 fatty acids 1000 MG capsule Take 2 g by mouth daily.     gabapentin (NEURONTIN) 400 MG capsule Take 400 mg by mouth 2 (two) times daily.   latanoprost (XALATAN) 0.005 % ophthalmic solution Place 1 drop into both eyes at bedtime.   Meclizine  HCl 25 MG CHEW Chew 1-2 tablets by mouth daily as needed (Veritgo).   metoprolol  succinate (TOPROL -XL) 25 MG 24 hr tablet TAKE 1 TABLET DAILY WITH OR IMMEDIATELY FOLLOWING A MEAL. PLEASE SCHEDULE OFFICE VISIT FOR FUTURE REFILLS   Multiple Vitamins-Minerals (MULTIVITAMIN WITH MINERALS) tablet Take 1 tablet by mouth daily.     polyethylene glycol powder (GLYCOLAX/MIRALAX) 17 GM/SCOOP powder Take 17 g by mouth daily as needed.    No facility-administered encounter medications on file as of 07/21/2024.    Social History: Social History[1]  Family Medical History: Family History  Problem Relation Age of Onset   Diabetes Mother    Kidney disease Mother    Cancer Father        melanoma   Stroke Paternal Grandmother    Colon cancer Neg Hx    Rectal cancer Neg Hx    Stomach cancer Neg Hx    Esophageal cancer Neg Hx    Colon polyps Neg Hx     Physical Examination: Vitals:   07/21/24 1506  BP: 118/78    General: Patient is well developed, well nourished, calm, collected, and in no apparent distress. Attention to examination is appropriate.  Respiratory: Patient is breathing without any difficulty.   NEUROLOGICAL:     Awake, alert, oriented to person, place, and time.  Speech is clear and fluent. Fund of knowledge is appropriate.   Cranial Nerves: Pupils equal round and reactive to light.  Facial tone is symmetric.    Well healed TL incision.   No posterior lumbar  tenderness. No tenderness over bilateral SI joints.   No abnormal lesions on exposed skin.   Strength: Side Biceps Triceps Deltoid Interossei Grip Wrist Ext. Wrist Flex.  R 5 5 5 5 5 5 5   L 5 5 5 5 5 5 5    Side Iliopsoas Quads Hamstring PF DF EHL  R 5 5 5 5 5 5   L 5 5 5 5 5 5    Reflexes are 1+ and symmetric at the biceps, brachioradialis, patella and achilles.   Hoffman's is absent.  Clonus is not present.   Bilateral upper and lower extremity sensation is intact to light touch.     No pain with IR/ER of both hips.   She ambulates with a rollator.   Medical Decision Making  Imaging: Lumbar xrays dated  02/20/24:  FINDINGS: Status post surgical posterior fusion extending from T10 to the sacrum, with intrapedicular screw placement at T10, T12, L3, L4 and S1 and S2. Diffuse osteopenia is noted. No fracture or spondylolisthesis is noted.   IMPRESSION: Postsurgical changes as described above. No acute abnormality seen.     Electronically Signed   By: Lynwood Landy Raddle M.D.   On: 02/20/2024 15:51   Thoracic xrays dated 02/20/24:  FINDINGS: No fracture or spondylolisthesis is noted. Status post surgical posterior fusion extending from T10 into thoracic spine. Mild dextroscoliosis of thoracic spine is noted.   IMPRESSION: No acute abnormality seen.     Electronically Signed   By: Lynwood Landy Raddle M.D.   On: 02/20/2024 15:49     I have personally reviewed the images and agree with the above interpretation.  Assessment and Plan: Ms. Haith has a history of TL fusion for scoliosis years ago.   She has intermittent LBP/buttock with intermittent bilateral leg pain (anterior and posterior) to her knees x 5-6 years. LBP/buttock pain > leg pain, right leg pain = left leg pain. She has numbness, tingling, and weakness in her legs.   As above, she has known TL fusion for scoliosis.   Treatment options discussed with patient and following plan made:   - Will get full length  scoliosis xrays at [facility].  - May need to consider further imaging, but will review scoliosis xrays first.  - Will message her with xray results once I get them back along with further recommendations.  - Of note, she has some improvement in pain after PT last summer at [facility]. Had some improvement with previous SI and piriformis injections with Dr.Lateef.   I spent a total of 30 minutes in face-to-face and non-face-to-face activities related to this patient's care today including review of outside records, review of imaging, review of symptoms, physical exam, discussion of differential diagnosis, discussion of treatment options, and documentation.   Thank you for involving me in the care of this patient.   Glade Boys PA-C Dept. of Neurosurgery      [1]  Social History Tobacco Use   Smoking status: Never   Smokeless tobacco: Never  Vaping Use   Vaping status: Never Used  Substance Use Topics   Alcohol use: Never    Comment: rarely - holiday   Drug use: No   "

## 2024-07-21 ENCOUNTER — Encounter: Payer: Self-pay | Admitting: Orthopedic Surgery

## 2024-07-21 ENCOUNTER — Ambulatory Visit: Admission: RE | Admit: 2024-07-21 | Source: Ambulatory Visit

## 2024-07-21 ENCOUNTER — Ambulatory Visit: Admitting: Orthopedic Surgery

## 2024-07-21 VITALS — BP 118/78 | Wt 132.4 lb

## 2024-07-21 DIAGNOSIS — M47816 Spondylosis without myelopathy or radiculopathy, lumbar region: Secondary | ICD-10-CM

## 2024-07-21 DIAGNOSIS — Z981 Arthrodesis status: Secondary | ICD-10-CM

## 2024-07-21 NOTE — Patient Instructions (Signed)
 It was so nice to see you today. Thank you so much for coming in.    I want to get full length scoliosis xrays of your entire spine. These need to be done at [facility].   After you have the MRI, it can take 7-10 days for me to get the results back. If I don't have them in 2 weeks, we will call to try to get the results.   Once I have the results, I will send you a message.   We may need to consider more imaging, but I want to look at these xrays first.    Please do not hesitate to call if you have any questions or concerns. You can also message me in MyChart.   Glade Boys PA-C [phone]     The physicians and staff at [facility] at [facility] are committed to providing excellent care. You may receive a survey asking for feedback about your experience at our office. We value you your feedback and appreciate you taking the time to to fill it out. The [facility] leadership team is also available to discuss your experience in person, feel free to contact us  [phone].

## 2024-10-10 ENCOUNTER — Ambulatory Visit: Admitting: Family Medicine

## 2025-01-15 ENCOUNTER — Ambulatory Visit
